# Patient Record
Sex: Female | Born: 1990 | Hispanic: Yes | Marital: Married | Smoking: Former smoker
Health system: Southern US, Community
[De-identification: ages and names within clinical notes are randomized; demographics above are authoritative.]

## PROBLEM LIST (undated history)

## (undated) ENCOUNTER — Inpatient Hospital Stay (HOSPITAL_COMMUNITY): Payer: Medicaid Other

## (undated) VITALS — BP 98/64 | HR 102 | Temp 97.7°F | Resp 20 | Ht 59.0 in | Wt 150.0 lb

## (undated) DIAGNOSIS — T1491XA Suicide attempt, initial encounter: Secondary | ICD-10-CM

## (undated) DIAGNOSIS — F419 Anxiety disorder, unspecified: Secondary | ICD-10-CM

## (undated) DIAGNOSIS — J45909 Unspecified asthma, uncomplicated: Secondary | ICD-10-CM

## (undated) DIAGNOSIS — R51 Headache: Secondary | ICD-10-CM

## (undated) DIAGNOSIS — F32A Depression, unspecified: Secondary | ICD-10-CM

## (undated) DIAGNOSIS — R519 Headache, unspecified: Secondary | ICD-10-CM

## (undated) DIAGNOSIS — F319 Bipolar disorder, unspecified: Secondary | ICD-10-CM

## (undated) DIAGNOSIS — F603 Borderline personality disorder: Secondary | ICD-10-CM

## (undated) DIAGNOSIS — E782 Mixed hyperlipidemia: Secondary | ICD-10-CM

## (undated) DIAGNOSIS — Z0282 Encounter for adoption services: Secondary | ICD-10-CM

## (undated) DIAGNOSIS — F329 Major depressive disorder, single episode, unspecified: Secondary | ICD-10-CM

## (undated) DIAGNOSIS — F4310 Post-traumatic stress disorder, unspecified: Secondary | ICD-10-CM

## (undated) DIAGNOSIS — K219 Gastro-esophageal reflux disease without esophagitis: Secondary | ICD-10-CM

## (undated) HISTORY — DX: Mixed hyperlipidemia: E78.2

## (undated) HISTORY — PX: GANGLION CYST EXCISION: SHX1691

## (undated) SURGERY — Surgical Case
Anesthesia: *Unknown

---

## 2012-05-16 ENCOUNTER — Other Ambulatory Visit: Payer: Self-pay | Admitting: Gastroenterology

## 2012-05-16 DIAGNOSIS — R1013 Epigastric pain: Secondary | ICD-10-CM

## 2012-05-23 ENCOUNTER — Ambulatory Visit
Admission: RE | Admit: 2012-05-23 | Discharge: 2012-05-23 | Disposition: A | Discharge status: Home / Self Care | Payer: 59 | Source: Ambulatory Visit | Attending: Gastroenterology | Admitting: Gastroenterology

## 2012-05-23 DIAGNOSIS — R1013 Epigastric pain: Secondary | ICD-10-CM

## 2012-06-09 ENCOUNTER — Emergency Department (HOSPITAL_COMMUNITY)
Admission: EM | Admit: 2012-06-09 | Discharge: 2012-06-10 | Disposition: A | Discharge status: Other Acute Inpatient Hospital | Payer: 59 | Attending: Emergency Medicine | Admitting: Emergency Medicine

## 2012-06-09 ENCOUNTER — Encounter (HOSPITAL_COMMUNITY): Payer: Self-pay | Admitting: *Deleted

## 2012-06-09 DIAGNOSIS — S61519A Laceration without foreign body of unspecified wrist, initial encounter: Secondary | ICD-10-CM

## 2012-06-09 DIAGNOSIS — S61509A Unspecified open wound of unspecified wrist, initial encounter: Secondary | ICD-10-CM | POA: Insufficient documentation

## 2012-06-09 DIAGNOSIS — F489 Nonpsychotic mental disorder, unspecified: Secondary | ICD-10-CM | POA: Insufficient documentation

## 2012-06-09 DIAGNOSIS — Z7289 Other problems related to lifestyle: Secondary | ICD-10-CM

## 2012-06-09 DIAGNOSIS — X789XXA Intentional self-harm by unspecified sharp object, initial encounter: Secondary | ICD-10-CM | POA: Insufficient documentation

## 2012-06-09 HISTORY — DX: Major depressive disorder, single episode, unspecified: F32.9

## 2012-06-09 HISTORY — DX: Anxiety disorder, unspecified: F41.9

## 2012-06-09 HISTORY — DX: Borderline personality disorder: F60.3

## 2012-06-09 HISTORY — DX: Depression, unspecified: F32.A

## 2012-06-09 HISTORY — DX: Bipolar disorder, unspecified: F31.9

## 2012-06-09 HISTORY — DX: Post-traumatic stress disorder, unspecified: F43.10

## 2012-06-09 LAB — COMPREHENSIVE METABOLIC PANEL
ALT: 6 U/L (ref 0–35)
AST: 16 U/L (ref 0–37)
Albumin: 4.4 g/dL (ref 3.5–5.2)
Alkaline Phosphatase: 60 U/L (ref 39–117)
BUN: 8 mg/dL (ref 6–23)
CO2: 19 mEq/L (ref 19–32)
Calcium: 10 mg/dL (ref 8.4–10.5)
Chloride: 106 mEq/L (ref 96–112)
Creatinine, Ser: 0.64 mg/dL (ref 0.50–1.10)
GFR calc Af Amer: >90 mL/min (ref >90)
GFR calc non Af Amer: >90 mL/min (ref >90)
Glucose, Bld: 118 mg/dL — ABNORMAL HIGH (ref 70–99)
Potassium: 3.2 mEq/L — ABNORMAL LOW (ref 3.5–5.1)
Sodium: 136 mEq/L (ref 135–145)
Total Bilirubin: 0.5 mg/dL (ref 0.3–1.2)
Total Protein: 7.5 g/dL (ref 6.0–8.3)

## 2012-06-09 LAB — URINE MICROSCOPIC-ADD ON

## 2012-06-09 LAB — CBC WITH DIFFERENTIAL/PLATELET
Basophils Absolute: 0 10*3/uL (ref 0.0–0.1)
Basophils Relative: 0 % (ref 0–1)
Eosinophils Absolute: 0.1 10*3/uL (ref 0.0–0.7)
Eosinophils Relative: 1 % (ref 0–5)
HCT: 39.4 % (ref 36.0–46.0)
Hemoglobin: 13.9 g/dL (ref 12.0–15.0)
Lymphocytes Relative: 38 % (ref 12–46)
Lymphs Abs: 2.1 10*3/uL (ref 0.7–4.0)
MCH: 29.8 pg (ref 26.0–34.0)
MCHC: 35.3 g/dL (ref 30.0–36.0)
MCV: 84.4 fL (ref 78.0–100.0)
Monocytes Absolute: 0.3 10*3/uL (ref 0.1–1.0)
Monocytes Relative: 5 % (ref 3–12)
Neutro Abs: 3 10*3/uL (ref 1.7–7.7)
Neutrophils Relative %: 55 % (ref 43–77)
Platelets: 216 10*3/uL (ref 150–400)
RBC: 4.67 MIL/uL (ref 3.87–5.11)
RDW: 12.7 % (ref 11.5–15.5)
WBC: 5.4 10*3/uL (ref 4.0–10.5)

## 2012-06-09 LAB — ETHANOL: Alcohol, Ethyl (B): <11 mg/dL (ref 0–11)

## 2012-06-09 LAB — URINALYSIS, ROUTINE W REFLEX MICROSCOPIC
Glucose, UA: NEGATIVE mg/dL
Hgb urine dipstick: NEGATIVE
Ketones, ur: 40 mg/dL — AB
Nitrite: NEGATIVE
Protein, ur: NEGATIVE mg/dL
Specific Gravity, Urine: 1.024 (ref 1.005–1.030)
Urobilinogen, UA: 1 mg/dL (ref 0.0–1.0)
pH: 6.5 (ref 5.0–8.0)

## 2012-06-09 LAB — POCT PREGNANCY, URINE: Preg Test, Ur: NEGATIVE

## 2012-06-09 MED ORDER — ETODOLAC 400 MG PO TABS
400.0000 mg | ORAL_TABLET | Freq: Three times a day (TID) | ORAL | Status: DC
  Administered 2012-06-10: 400 mg via ORAL
  Filled 2012-06-09 (×4): qty 1

## 2012-06-09 MED ORDER — TRAZODONE HCL 50 MG PO TABS
75.0000 mg | ORAL_TABLET | Freq: Every day | ORAL | Status: DC
  Administered 2012-06-10: 75 mg via ORAL
  Filled 2012-06-09: qty 2

## 2012-06-09 MED ORDER — NALTREXONE HCL 50 MG PO TABS
25.0000 mg | ORAL_TABLET | Freq: Every morning | ORAL | Status: DC
  Administered 2012-06-10: 25 mg via ORAL
  Filled 2012-06-09: qty 1

## 2012-06-09 MED ORDER — IBUPROFEN 600 MG PO TABS: 600.0000 mg | ORAL_TABLET | Freq: Three times a day (TID) | ORAL | Status: DC | PRN

## 2012-06-09 MED ORDER — ZIPRASIDONE HCL 20 MG PO CAPS
40.0000 mg | ORAL_CAPSULE | Freq: Every day | ORAL | Status: DC
  Administered 2012-06-10: 40 mg via ORAL
  Filled 2012-06-09: qty 2

## 2012-06-09 MED ORDER — ACETAMINOPHEN 325 MG PO TABS: 650.0000 mg | ORAL_TABLET | ORAL | Status: DC | PRN

## 2012-06-09 MED ORDER — ONDANSETRON HCL 4 MG PO TABS: 4.0000 mg | ORAL_TABLET | Freq: Three times a day (TID) | ORAL | Status: DC | PRN

## 2012-06-09 MED ORDER — ZOLPIDEM TARTRATE 5 MG PO TABS: 5.0000 mg | ORAL_TABLET | Freq: Every evening | ORAL | Status: DC | PRN

## 2012-06-09 MED ORDER — ALUM & MAG HYDROXIDE-SIMETH 200-200-20 MG/5ML PO SUSP: 30.0000 mL | ORAL | Status: DC | PRN

## 2012-06-09 MED ORDER — TOPIRAMATE 25 MG PO TABS: 50.0000 mg | ORAL_TABLET | Freq: Every day | ORAL | Status: DC

## 2012-06-09 MED ORDER — POTASSIUM CHLORIDE CRYS ER 20 MEQ PO TBCR
40.0000 meq | EXTENDED_RELEASE_TABLET | Freq: Once | ORAL | Status: AC
  Administered 2012-06-10: 40 meq via ORAL
  Filled 2012-06-09: qty 2

## 2012-06-09 MED ORDER — ACETAMINOPHEN 325 MG PO TABS
650.0000 mg | ORAL_TABLET | Freq: Once | ORAL | Status: AC
  Administered 2012-06-09: 650 mg via ORAL
  Filled 2012-06-09: qty 2

## 2012-06-09 MED ORDER — LORAZEPAM 1 MG PO TABS: 1.0000 mg | ORAL_TABLET | Freq: Three times a day (TID) | ORAL | Status: DC | PRN

## 2012-06-09 MED ORDER — SERTRALINE HCL 50 MG PO TABS
100.0000 mg | ORAL_TABLET | Freq: Every day | ORAL | Status: DC
  Administered 2012-06-10: 100 mg via ORAL
  Filled 2012-06-09: qty 2

## 2012-06-09 NOTE — ED Notes (Signed)
Pt ambulated with steady gait to bathroom with sitter. Pt calm and cooperative, stating she understands why the sitter must be with her at all times
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
## 2012-06-09 NOTE — ED Provider Notes (Signed)
History    Kelly Barry brought in as possible suicide attempt. Cut L wrist. Pt says that not actually trying to kill self. Upset that felt parents ignoring her complaints of abdominal pain which has been ongoing for past 5 months and currently being worked up. Became severe tonight and says upset because felt like parents weren't taking her seriously and would bring her to ED. Pt says she cut her wrist to deal with the pain. Psych hx including previous cutting behavior. Pt says abdominal pain not better. No urinary complaints. No fever or chills. Denies ingestion.   CSN: [id number]  Arrival date & time 06/09/12  1945   First MD Initiated Contact with Patient 06/09/12 2239      Chief Complaint  Patient presents with  . Suicide Attempt  . Laceration    wrist    (Consider location/radiation/quality/duration/timing/severity/associated sxs/prior treatment) HPI  Past Medical History  Diagnosis Date  . PTSD (post-traumatic stress disorder)   . Bipolar disorder   . Depression   . Anxiety   . Borderline personality disorder     No past surgical history on file.  No family history on file.  History  Substance Use Topics  . Smoking status: Not on file  . Smokeless tobacco: Not on file  . Alcohol Use:     OB History    Grav Para Term Preterm Abortions TAB SAB Ect Mult Living                  Review of Systems   Review of symptoms negative unless otherwise noted in HPI.   Allergies  Hydroxyzine  Home Medications   Current Outpatient Rx  Name Route Sig Dispense Refill  . ETODOLAC 400 MG PO TABS Oral Take 400 mg by mouth 3 (three) times daily.    Marland Kitchen NALTREXONE HCL 50 MG PO TABS Oral Take 25 mg by mouth every morning.    Marland Kitchen SERTRALINE HCL 100 MG PO TABS Oral Take 100 mg by mouth daily.    . TOPIRAMATE 50 MG PO TABS Oral Take 50 mg by mouth at bedtime.    . TRAZODONE HCL 50 MG PO TABS Oral Take 75 mg by mouth at bedtime.    Marland Kitchen ZIPRASIDONE HCL 40 MG PO CAPS Oral Take 40 mg by mouth  at bedtime.      BP 116/62  Pulse 105  Temp 99.7 F (37.6 C) (Oral)  Resp 15  SpO2 98%  Physical Exam  Nursing note and vitals reviewed. Constitutional: She appears well-developed and well-nourished. No distress.  HENT:  Head: Normocephalic and atraumatic.  Eyes: Conjunctivae are normal. Right eye exhibits no discharge. Left eye exhibits no discharge.  Neck: Neck supple.  Cardiovascular: Normal rate, regular rhythm and normal heart sounds.  Exam reveals no gallop and no friction rub.   No murmur heard. Pulmonary/Chest: Effort normal and breath sounds normal. No respiratory distress.  Abdominal: Soft. She exhibits no distension. There is no tenderness.  Musculoskeletal: She exhibits no edema and no tenderness.  Neurological: She is alert.  Skin: Skin is warm and dry.       Volar aspect of distal L wrist with 2.5 cm laceration. No active bleeding. Visualized through out ROM with evidence of tendon injury or to other deeper structures. Flexion intact against resistance. Neurovascularly intact distally. Good radial pulse. Numerous well healed linear scars along forearm.  Psychiatric: She has a normal mood and affect. Her behavior is normal. Thought content normal.    ED Course  Procedures (including critical care time)  LACERATION REPAIR Performed by: Raeford Razor Authorized by: Raeford Razor Consent: Verbal consent obtained. Risks and benefits: risks, benefits and alternatives were discussed Consent given by: patient Patient identity confirmed: provided demographic data Prepped and Draped in normal sterile fashion Wound explored  Laceration Location: L wrist  Laceration Length: 2.5 cm  No Foreign Bodies seen or palpated  Anesthesia: local infiltration  Local anesthetic: lidocaine 2% w epinephrine  Anesthetic total: 2 ml  Irrigation method: syringe Amount of cleaning: standard  Skin closure: single layer  Number of sutures: 3  Technique: staples  Patient  tolerance: Patient tolerated the procedure well with no immediate complications.  Labs Reviewed  COMPREHENSIVE METABOLIC PANEL - Abnormal; Notable for the following:    Potassium 3.2 (*)     Glucose, Bld 118 (*)     All other components within normal limits  URINALYSIS, ROUTINE W REFLEX MICROSCOPIC - Abnormal; Notable for the following:    APPearance CLOUDY (*)     Bilirubin Urine SMALL (*)     Ketones, ur 40 (*)     Leukocytes, UA SMALL (*)     All other components within normal limits  URINE MICROSCOPIC-ADD ON - Abnormal; Notable for the following:    Squamous Epithelial / LPF FEW (*)     Bacteria, UA MANY (*)     All other components within normal limits  CBC WITH DIFFERENTIAL  ETHANOL  POCT PREGNANCY, URINE   No results found.   1. Deliberate self-cutting   2. Wrist laceration       MDM  Kelly Barry brought in for evaluation of possible suicide attempt, which pt adamantly denies. Hx of cutting and numerous well healed linear scars on L forearm. Pt says tonight was cutting to distract herself from her abdominal pain and not actually trying to kill self. Laceration deep enough to require repair but they have had in the past as well. Pt has been medically cleared at this time. Will obtain psych consult for recommendations in terms of disposition.       Raeford Razor, MD 06/10/12 Jacinta Shoe
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
## 2012-06-09 NOTE — ED Notes (Signed)
[id number]<AV> Expected date:<BR> Expected time:<BR> Means of arrival:<BR> Comments:<BR> EMS/SI/Slit wrists

## 2012-06-10 ENCOUNTER — Encounter (HOSPITAL_COMMUNITY): Payer: Self-pay | Admitting: *Deleted

## 2012-06-10 NOTE — ED Notes (Signed)
Report given to psych ED. 

## 2012-06-10 NOTE — ED Notes (Signed)
Report given to Psych PA, who will eval patient in lieu of telepsych.

## 2012-06-10 NOTE — ED Provider Notes (Signed)
Pt accepted at [facility] by Dr. Carles Collet, MD 06/10/12 [phone]
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
## 2012-06-11 ENCOUNTER — Ambulatory Visit (HOSPITAL_COMMUNITY): Admission: RE | Admit: 2012-06-11 | Payer: 59 | Source: Ambulatory Visit | Admitting: Gastroenterology

## 2012-06-11 ENCOUNTER — Encounter (HOSPITAL_COMMUNITY): Admission: RE | Payer: Self-pay | Source: Ambulatory Visit

## 2012-06-11 SURGERY — ESOPHAGOGASTRODUODENOSCOPY (EGD) WITH PROPOFOL
Anesthesia: Monitor Anesthesia Care

## 2012-06-17 NOTE — H&P (Signed)
Agree with the assessment

## 2012-06-25 ENCOUNTER — Encounter (HOSPITAL_COMMUNITY): Payer: Self-pay | Admitting: Anesthesiology

## 2012-06-25 ENCOUNTER — Ambulatory Visit (HOSPITAL_COMMUNITY)
Admission: RE | Admit: 2012-06-25 | Discharge: 2012-06-25 | Disposition: A | Discharge status: Home / Self Care | Payer: 59 | Source: Ambulatory Visit | Attending: Gastroenterology | Admitting: Gastroenterology

## 2012-06-25 ENCOUNTER — Encounter (HOSPITAL_COMMUNITY): Payer: Self-pay | Admitting: *Deleted

## 2012-06-25 ENCOUNTER — Ambulatory Visit (HOSPITAL_COMMUNITY): Payer: 59 | Admitting: Anesthesiology

## 2012-06-25 ENCOUNTER — Encounter (HOSPITAL_COMMUNITY)
Admission: RE | Disposition: A | Discharge status: Home / Self Care | Payer: Self-pay | Source: Ambulatory Visit | Attending: Gastroenterology

## 2012-06-25 DIAGNOSIS — R1013 Epigastric pain: Secondary | ICD-10-CM | POA: Insufficient documentation

## 2012-06-25 DIAGNOSIS — K2940 Chronic atrophic gastritis without bleeding: Secondary | ICD-10-CM | POA: Insufficient documentation

## 2012-06-25 DIAGNOSIS — K3189 Other diseases of stomach and duodenum: Secondary | ICD-10-CM | POA: Insufficient documentation

## 2012-06-25 HISTORY — PX: ESOPHAGOGASTRODUODENOSCOPY: SHX5428

## 2012-06-25 SURGERY — EGD (ESOPHAGOGASTRODUODENOSCOPY)
Anesthesia: Monitor Anesthesia Care

## 2012-06-25 MED ORDER — PROPOFOL 10 MG/ML IV EMUL
INTRAVENOUS | Status: DC | PRN
  Administered 2012-06-25: 20 mg via INTRAVENOUS
  Administered 2012-06-25: 50 mg via INTRAVENOUS
  Administered 2012-06-25: 20 mg via INTRAVENOUS

## 2012-06-25 MED ORDER — SODIUM CHLORIDE 0.9 % IV SOLN: INTRAVENOUS | Status: DC

## 2012-06-25 MED ORDER — BUTAMBEN-TETRACAINE-BENZOCAINE 2-2-14 % EX AERO
INHALATION_SPRAY | CUTANEOUS | Status: DC | PRN
  Administered 2012-06-25: 2 via TOPICAL

## 2012-06-25 MED ORDER — LACTATED RINGERS IV SOLN
INTRAVENOUS | Status: DC | PRN
  Administered 2012-06-25: 10:00:00 via INTRAVENOUS

## 2012-06-25 MED ORDER — MIDAZOLAM HCL 5 MG/5ML IJ SOLN
INTRAMUSCULAR | Status: DC | PRN
  Administered 2012-06-25: 2 mg via INTRAVENOUS

## 2012-06-25 MED ORDER — FENTANYL CITRATE 0.05 MG/ML IJ SOLN
INTRAMUSCULAR | Status: DC | PRN
  Administered 2012-06-25: 50 ug via INTRAVENOUS

## 2012-06-25 NOTE — H&P (Signed)
Patient interval history reviewed.  Patient examined again.  Was admitted to behavioral health couple weeks ago for suicidal ideation/attempt.  She was discharged from this facility and is now outpatient.  There has been no other change from documented H/P dated  (scanned into chart from our office) except as documented above.  Assessment:  1.  Epigastric discomfort.  Labs and U/S unrevealing.  No improvement with PPI (pantoprazole) or antispasmodic (dicylomine). 2.  ? History of H. Pylori.  Plan:  1.  Upper endoscopy with likely gastric and possible small bowel biopsies. 2.  Risks (bleeding, infection, bowel perforation that could require surgery, sedation-related changes in cardiopulmonary systems), benefits (identification and possible treatment of source of symptoms, exclusion of certain causes of symptoms), and alternatives (watchful waiting, radiographic imaging studies, empiric medical treatment) of upper endoscopy (EGD) were explained to patient/mother in detail and patient wishes to proceed.

## 2012-06-25 NOTE — Op Note (Signed)
[facility] [address] [place], [address]   ENDOSCOPY PROCEDURE REPORT  PATIENT: Kelly Barry, Kelly Barry  MR#: [id number] BIRTHDATE: 1991-09-19 , 21  yrs. old GENDER: Female ENDOSCOPIST: Willis Modena, MD REFERRED BY:  Self-Referred PROCEDURE DATE:  06/25/2012 PROCEDURE:  Upper endoscopy (EGD) with biopsies. ASA CLASS:     ASA II INDICATIONS:  epigastric abdominal pain, dyspepsia, ? history H. pylori MEDICATIONS: MAC per anesthesia TOPICAL ANESTHETIC: Cetacaine spray x 2  DESCRIPTION OF PROCEDURE: After the risks benefits and alternatives of the procedure were thoroughly explained, informed consent was obtained.  The [id number] ([id number]) endoscope was introduced through the mouth and advanced to the second portion of the duodenum     . Without limitations.  The instrument was slowly withdrawn as the mucosa was fully examined.     Findings: Normal esophagus.  Mild pre-pyloric gastric erosions and gastritis, biopsied with cold forceps.  Otherwise normal stomach and pylorus. Normal duodenum to the second portion.              The scope was then withdrawn from the patient and the procedure completed.  ENDOSCOPIC IMPRESSION:     As above.  RECOMMENDATIONS:     1.  Watch for potential complications of procedure. 2.  Await biopsy results. 3.  Change pantoprazole to different PPI, pending biopsy results. 4.  If symptoms persist, consider CT abd/pelvis. 5.  Follow-up with me in 4-6 weeks.  eSigned:  Willis Modena, MD 06/25/2012 11:37 AM   CC:

## 2012-06-25 NOTE — Preoperative (Signed)
Beta Blockers   Reason not to administer Beta Blockers:Not Applicable 

## 2012-06-25 NOTE — Anesthesia Postprocedure Evaluation (Signed)
  Anesthesia Post-op Note  Patient: Kelly Barry  Procedure(s) Performed: Procedure(s) (LRB): ESOPHAGOGASTRODUODENOSCOPY (EGD) (N/A)  Patient Location: PACU  Anesthesia Type: MAC  Level of Consciousness: awake and alert   Airway and Oxygen Therapy: Patient Spontanous Breathing  Post-op Pain: mild  Post-op Assessment: Post-op Vital signs reviewed, Patient's Cardiovascular Status Stable, Respiratory Function Stable, Patent Airway and No signs of Nausea or vomiting  Post-op Vital Signs: stable  Complications: No apparent anesthesia complications

## 2012-06-25 NOTE — Anesthesia Preprocedure Evaluation (Addendum)
Anesthesia Evaluation  Patient identified by MRN, date of birth, ID band Patient awake    Reviewed: Allergy & Precautions, H&P , NPO status , Patient's Chart, lab work & pertinent test results  Airway Mallampati: II TM Distance: >3 FB Neck ROM: Full    Dental No notable dental hx.    Pulmonary neg pulmonary ROS,  breath sounds clear to auscultation  Pulmonary exam normal       Cardiovascular negative cardio ROS  Rhythm:Regular Rate:Normal     Neuro/Psych PSYCHIATRIC DISORDERS Anxiety Depression negative neurological ROS     GI/Hepatic negative GI ROS, Neg liver ROS,   Endo/Other  negative endocrine ROS  Renal/GU negative Renal ROS  negative genitourinary   Musculoskeletal negative musculoskeletal ROS (+)   Abdominal   Peds negative pediatric ROS (+)  Hematology negative hematology ROS (+)   Anesthesia Other Findings   Reproductive/Obstetrics Negative pregnancy test 06/09/12. Denies the possibility of pregnancy today. Declines testing.                          Anesthesia Physical Anesthesia Plan  ASA: II  Anesthesia Plan: MAC   Post-op Pain Management:    Induction: Intravenous  Airway Management Planned:   Additional Equipment:   Intra-op Plan:   Post-operative Plan: Extubation in OR  Informed Consent: I have reviewed the patients History and Physical, chart, labs and discussed the procedure including the risks, benefits and alternatives for the proposed anesthesia with the patient or authorized representative who has indicated his/her understanding and acceptance.   Dental advisory given  Plan Discussed with: CRNA  Anesthesia Plan Comments:         Anesthesia Quick Evaluation

## 2012-06-25 NOTE — Transfer of Care (Signed)
Immediate Anesthesia Transfer of Care Note  Patient: Kelly Barry  Procedure(s) Performed: Procedure(s) (LRB) with comments: ESOPHAGOGASTRODUODENOSCOPY (EGD) (N/A) - [name]/ebp  Patient Location: PACU  Anesthesia Type: MAC  Level of Consciousness: awake, sedated and patient cooperative  Airway & Oxygen Therapy: Patient Spontanous Breathing and Patient connected to nasal cannula oxygen  Post-op Assessment: Report given to PACU RN and Post -op Vital signs reviewed and stable  Post vital signs: Reviewed and stable  Complications: No apparent anesthesia complications

## 2012-06-27 ENCOUNTER — Encounter (HOSPITAL_COMMUNITY): Payer: Self-pay | Admitting: Gastroenterology

## 2012-08-14 ENCOUNTER — Emergency Department (HOSPITAL_COMMUNITY)
Admission: EM | Admit: 2012-08-14 | Discharge: 2012-08-15 | Disposition: A | Discharge status: Home / Self Care | Payer: 59 | Attending: Emergency Medicine | Admitting: Emergency Medicine

## 2012-08-14 DIAGNOSIS — E876 Hypokalemia: Secondary | ICD-10-CM | POA: Insufficient documentation

## 2012-08-14 DIAGNOSIS — F341 Dysthymic disorder: Secondary | ICD-10-CM | POA: Insufficient documentation

## 2012-08-14 DIAGNOSIS — K2970 Gastritis, unspecified, without bleeding: Secondary | ICD-10-CM

## 2012-08-14 DIAGNOSIS — K2900 Acute gastritis without bleeding: Secondary | ICD-10-CM | POA: Insufficient documentation

## 2012-08-14 DIAGNOSIS — Z79899 Other long term (current) drug therapy: Secondary | ICD-10-CM | POA: Insufficient documentation

## 2012-08-14 DIAGNOSIS — F4310 Post-traumatic stress disorder, unspecified: Secondary | ICD-10-CM | POA: Insufficient documentation

## 2012-08-14 MED ORDER — ONDANSETRON 8 MG PO TBDP
8.0000 mg | ORAL_TABLET | Freq: Once | ORAL | Status: AC
  Administered 2012-08-14: 8 mg via ORAL
  Filled 2012-08-14: qty 1

## 2012-08-14 NOTE — ED Notes (Signed)
Pt reports having nausea for 9 months. States that she has not eaten since last Thursday. Admits to having an eating disorder and seeing the GI doctor for GI problems. States she last saw the GI MD two weeks ago and was diagnosed with acid reflux.

## 2012-08-14 NOTE — ED Notes (Signed)
Patient is alert and oriented x3.  She is complaining of nausea for 3 months.  Only dry heaves with ems.  Currently she rates her pain at a 9 of 10

## 2012-08-15 ENCOUNTER — Emergency Department (HOSPITAL_COMMUNITY): Payer: 59

## 2012-08-15 LAB — URINE MICROSCOPIC-ADD ON

## 2012-08-15 LAB — URINALYSIS, ROUTINE W REFLEX MICROSCOPIC
Glucose, UA: NEGATIVE mg/dL
Hgb urine dipstick: NEGATIVE
Protein, ur: NEGATIVE mg/dL
Specific Gravity, Urine: 1.019 (ref 1.005–1.030)
pH: 7.5 (ref 5.0–8.0)

## 2012-08-15 LAB — POCT I-STAT, CHEM 8
BUN: 4 mg/dL — ABNORMAL LOW (ref 6–23)
Calcium, Ion: 1.29 mmol/L — ABNORMAL HIGH (ref 1.12–1.23)
Chloride: 109 mEq/L (ref 96–112)
Creatinine, Ser: 0.8 mg/dL (ref 0.50–1.10)
Glucose, Bld: 94 mg/dL (ref 70–99)
HCT: 41 % (ref 36.0–46.0)

## 2012-08-15 MED ORDER — ONDANSETRON HCL 8 MG PO TABS
8.0000 mg | ORAL_TABLET | Freq: Three times a day (TID) | ORAL | Status: DC | PRN
Start: 2012-08-15 — End: 2013-03-01

## 2012-08-15 MED ORDER — ONDANSETRON HCL 4 MG/2ML IJ SOLN
4.0000 mg | Freq: Once | INTRAMUSCULAR | Status: AC
  Administered 2012-08-15: 4 mg via INTRAVENOUS
  Filled 2012-08-15: qty 2

## 2012-08-15 MED ORDER — MAGNESIUM SULFATE 40 MG/ML IJ SOLN
2.0000 g | Freq: Once | INTRAMUSCULAR | Status: AC
  Administered 2012-08-15: 2 g via INTRAVENOUS
  Filled 2012-08-15: qty 50

## 2012-08-15 MED ORDER — IOHEXOL 300 MG/ML  SOLN
80.0000 mL | Freq: Once | INTRAMUSCULAR | Status: AC | PRN
  Administered 2012-08-15: 80 mL via INTRAVENOUS

## 2012-08-15 MED ORDER — MORPHINE SULFATE 4 MG/ML IJ SOLN
4.0000 mg | Freq: Once | INTRAMUSCULAR | Status: AC
  Administered 2012-08-15: 4 mg via INTRAVENOUS
  Filled 2012-08-15: qty 1

## 2012-08-15 MED ORDER — POTASSIUM CHLORIDE CRYS ER 20 MEQ PO TBCR
40.0000 meq | EXTENDED_RELEASE_TABLET | Freq: Once | ORAL | Status: AC
  Administered 2012-08-15: 40 meq via ORAL
  Filled 2012-08-15: qty 2

## 2012-08-15 MED ORDER — GI COCKTAIL ~~LOC~~
30.0000 mL | Freq: Once | ORAL | Status: AC
  Administered 2012-08-15: 30 mL via ORAL
  Filled 2012-08-15: qty 30

## 2012-08-15 MED ORDER — HYDROCODONE-ACETAMINOPHEN 5-500 MG PO TABS: 1.0000 | ORAL_TABLET | Freq: Four times a day (QID) | ORAL | Status: DC | PRN

## 2012-08-15 MED ORDER — FAMOTIDINE IN NACL 20-0.9 MG/50ML-% IV SOLN
20.0000 mg | Freq: Once | INTRAVENOUS | Status: AC
  Administered 2012-08-15: 20 mg via INTRAVENOUS
  Filled 2012-08-15: qty 50

## 2012-08-15 MED ORDER — POTASSIUM CHLORIDE 10 MEQ/100ML IV SOLN
10.0000 meq | Freq: Once | INTRAVENOUS | Status: AC
  Administered 2012-08-15: 10 meq via INTRAVENOUS
  Filled 2012-08-15: qty 100

## 2012-08-15 MED ORDER — PANTOPRAZOLE SODIUM 40 MG IV SOLR
40.0000 mg | Freq: Once | INTRAVENOUS | Status: AC
  Administered 2012-08-15: 40 mg via INTRAVENOUS
  Filled 2012-08-15: qty 40

## 2012-08-15 NOTE — ED Notes (Signed)
Patient transported to CT 

## 2012-08-15 NOTE — ED Notes (Signed)
End of shift report given to Amy, Rn

## 2012-08-15 NOTE — ED Notes (Signed)
[id number]<AV> Expected date:<BR> Expected time:<BR> Means of arrival:<BR> Comments:<BR> Hold for 7 or 8

## 2012-08-15 NOTE — ED Notes (Signed)
Vital signs stable. 

## 2012-08-15 NOTE — ED Provider Notes (Signed)
Medical screening examination/treatment/procedure(s) were performed by non-physician practitioner and as supervising physician I was immediately available for consultation/collaboration.   Hanley Seamen, MD 08/15/12 [phone]

## 2012-08-15 NOTE — ED Notes (Signed)
Discharge instructions reviewed. Rx given x2. All questions answered. Note given stating pt received treatment overnight.

## 2012-08-15 NOTE — ED Provider Notes (Signed)
History     CSN: [id number]  Arrival date & time 08/14/12  2113   First MD Initiated Contact with Patient 08/14/12 2153      Chief Complaint  Patient presents with  . Nausea    (Consider location/radiation/quality/duration/timing/severity/associated sxs/prior treatment) HPI History provided by pt and prior chart.  Pt reports chronic, non-radiating, burning epigastric pain w/ association N/V and bloating for the past 9 months.  Has been evaluated by GI, most recently 2 weeks ago, several different medications have been trialed, and nothing seems to improve sx.  Pain and nausea acutely worsened this past week.  Unable to tolerate any food or fluids and has therefore, not had anything to eat in 7 days.  Denies fever, CP, diarrhea, hematochezia, urinary and vaginal sx.  Per prior chart, pt had a nml [place] of abd 05/23/12 and then endoscopy 9/4 that showed mild gastritis and biopsy neg for h.pylori and malignancy.  Dr. Dulce Sellar planned to change PPI, see patient back in f/u and consider CT abd/pelvis if no improvement in sx.  Pt has h/o anorexia but this is not an active problem.  She has also had a recent admission for cutting.  Denies SI or any thoughts of harming herself today.   Past Medical History  Diagnosis Date  . PTSD (post-traumatic stress disorder)   . Bipolar disorder   . Depression   . Anxiety   . Borderline personality disorder     Past Surgical History  Procedure Date  . Ganglion cyst excision   . Esophagogastroduodenoscopy 06/25/2012    Procedure: ESOPHAGOGASTRODUODENOSCOPY (EGD);  Surgeon: Willis Modena, MD;  Location: [facility];  Service: Endoscopy;  Laterality: N/A;  [name]/ebp    No family history on file.  History  Substance Use Topics  . Smoking status: Not on file  . Smokeless tobacco: Not on file  . Alcohol Use: No    OB History    Grav Para Term Preterm Abortions TAB SAB Ect Mult Living                  Review of Systems  All other systems reviewed  and are negative.    Allergies  Hydroxyzine  Home Medications   Current Outpatient Rx  Name Route Sig Dispense Refill  . DEXLANSOPRAZOLE 60 MG PO CPDR Oral Take 60 mg by mouth daily.    . ETODOLAC 400 MG PO TABS Oral Take 400 mg by mouth 3 (three) times daily.    Marland Kitchen NALTREXONE HCL 50 MG PO TABS Oral Take 25 mg by mouth every morning. To help with opiates withdrawn    . OMEPRAZOLE 20 MG PO CPDR Oral Take 20 mg by mouth daily.    . SERTRALINE HCL 100 MG PO TABS Oral Take 100 mg by mouth daily.    . TOPIRAMATE 50 MG PO TABS Oral Take 50 mg by mouth at bedtime.    . TRAZODONE HCL 50 MG PO TABS Oral Take 75 mg by mouth at bedtime.    Marland Kitchen ZIPRASIDONE HCL 20 MG PO CAPS Oral Take 20 mg by mouth 2 (two) times daily with a meal.    . ZIPRASIDONE HCL 20 MG PO CAPS Oral Take 20 mg by mouth at bedtime.      BP 114/64  Pulse 76  SpO2 98%  Physical Exam  Nursing note and vitals reviewed. Constitutional: She is oriented to person, place, and time. She appears well-developed and well-nourished. No distress.  HENT:  Head: Normocephalic and atraumatic.  Eyes:       Normal appearance  Neck: Normal range of motion.  Cardiovascular: Normal rate and regular rhythm.   Pulmonary/Chest: Effort normal and breath sounds normal. No respiratory distress.  Abdominal: Soft. Bowel sounds are normal. She exhibits no distension and no mass. There is no tenderness. There is no rebound and no guarding.       Mild-mod epigastric and diffuse lower abdominal tenderness.    Genitourinary:       No CVA tenderness  Musculoskeletal: Normal range of motion.  Neurological: She is alert and oriented to person, place, and time.  Skin: Skin is warm and dry. No rash noted.  Psychiatric: She has a normal mood and affect. Her behavior is normal.    ED Course  Procedures (including critical care time)  Labs Reviewed  POCT I-STAT, CHEM 8 - Abnormal; Notable for the following:    Potassium 2.8 (*)     BUN 4 (*)      Calcium, Ion 1.29 (*)     All other components within normal limits  URINALYSIS, ROUTINE W REFLEX MICROSCOPIC - Abnormal; Notable for the following:    APPearance TURBID (*)     Leukocytes, UA SMALL (*)     All other components within normal limits  URINE MICROSCOPIC-ADD ON - Abnormal; Notable for the following:    Squamous Epithelial / LPF FEW (*)     Bacteria, UA FEW (*)     All other components within normal limits  PREGNANCY, URINE   Ct Abdomen Pelvis W Contrast  08/15/2012  *RADIOLOGY REPORT*  Clinical Data: Abdominal pain  CT ABDOMEN AND PELVIS WITH CONTRAST  Technique:  Multidetector CT imaging of the abdomen and pelvis was performed following the standard protocol during bolus administration of intravenous contrast.  Contrast: 80mL OMNIPAQUE IOHEXOL 300 MG/ML  SOLN  Comparison: 05/23/2012 abdominal ultrasound  Findings: Limited images through the lung bases demonstrate no significant appreciable abnormality. The heart size is within normal limits. No pleural or pericardial effusion.  Unremarkable liver, biliary system, spleen, pancreas, adrenal glands, kidneys.  No hydronephrosis or hydroureter.  No bowel obstruction.  No overt evidence for colitis.  Normal appendix.  No free intraperitoneal air or fluid.  No lymphadenopathy.  Normal caliber vasculature.  Thin-walled bladder.  Unremarkable CT appearance to the uterus and adnexa.  No acute osseous finding.  IMPRESSION: No acute abnormality identified by CT.   Original Report Authenticated By: Waneta Martins, M.D.      1. Gastritis   2. Hypokalemia       MDM  21yo F w/ h/o anorexia as well as chronic abd pain, currently thought to be secondary to gastritis, presents w/ acutely worsened epigastric pain, N/V and inability to tolerate pos x 1 week.  Per Dr. Hulen Shouts note, plan following endoscopy was to change PPI, see in f/u and if no sx improvement, CT the abd/pelvis.  Pt has received IV NS, protonix, pepcid, morphine and zofran as  well as GI cocktail.  Will reassess shortly.  U/A pending but labs otherwise sig for hypokalemia.  IV postassium and magnesium and po potassium ordered for replacement.  Will CT abdomen today.  1:45 AM   Pt reports that pain and nausea have improved.  2:24 AM   CT neg.  Results discussed w/ pt.  She is tolerating pos and reports that her pain is improved.  Prescribed hydrocodone and zofran.  Recommended that she continue Dr.Outlaw's treatment plan and follow up with him as scheduled  next week.  Return precautions discussed. 5:52 AM        Otilio Miu, PA 08/15/12 [phone]

## 2012-08-15 NOTE — ED Notes (Signed)
Patient has completed PO contrast. CT notified.

## 2012-08-15 NOTE — ED Notes (Signed)
Patient has completed 1 cup of contrast. Reminded patient she must drink both cups to do testing.

## 2012-08-15 NOTE — ED Notes (Signed)
Pt states "she has been having abd pain for the past 9 mos. States she has not eaten since last Thursday (more than a week), not because of anorexia but because her stomach hurts and burns so bad. The thought of food makes me throw up and when I eat I just throw the food right back up". Also, States "she is seeing Dr. Dulce Sellar and has had upper endo and further testing which have all been negative, or has showed some irritation". Pt is thin, but appropriate. Provides good eye contact.

## 2012-08-26 ENCOUNTER — Other Ambulatory Visit: Payer: Self-pay | Admitting: Gastroenterology

## 2012-08-26 DIAGNOSIS — R109 Unspecified abdominal pain: Secondary | ICD-10-CM

## 2012-08-28 ENCOUNTER — Ambulatory Visit
Admission: RE | Admit: 2012-08-28 | Discharge: 2012-08-28 | Disposition: A | Discharge status: Home / Self Care | Payer: 59 | Source: Ambulatory Visit | Attending: Gastroenterology | Admitting: Gastroenterology

## 2012-08-28 ENCOUNTER — Other Ambulatory Visit (HOSPITAL_COMMUNITY): Payer: Self-pay | Admitting: Gastroenterology

## 2012-08-28 DIAGNOSIS — R109 Unspecified abdominal pain: Secondary | ICD-10-CM

## 2012-09-24 ENCOUNTER — Encounter (HOSPITAL_COMMUNITY): Payer: 59

## 2013-03-01 ENCOUNTER — Emergency Department (HOSPITAL_COMMUNITY)
Admission: EM | Admit: 2013-03-01 | Discharge: 2013-03-02 | Disposition: A | Discharge status: Home / Self Care | Payer: Medicaid Other | Attending: Emergency Medicine | Admitting: Emergency Medicine

## 2013-03-01 ENCOUNTER — Encounter (HOSPITAL_COMMUNITY): Payer: Self-pay | Admitting: *Deleted

## 2013-03-01 DIAGNOSIS — K219 Gastro-esophageal reflux disease without esophagitis: Secondary | ICD-10-CM | POA: Insufficient documentation

## 2013-03-01 DIAGNOSIS — R1013 Epigastric pain: Secondary | ICD-10-CM | POA: Insufficient documentation

## 2013-03-01 DIAGNOSIS — Z79899 Other long term (current) drug therapy: Secondary | ICD-10-CM | POA: Insufficient documentation

## 2013-03-01 DIAGNOSIS — R112 Nausea with vomiting, unspecified: Secondary | ICD-10-CM | POA: Insufficient documentation

## 2013-03-01 DIAGNOSIS — F603 Borderline personality disorder: Secondary | ICD-10-CM | POA: Insufficient documentation

## 2013-03-01 DIAGNOSIS — F319 Bipolar disorder, unspecified: Secondary | ICD-10-CM | POA: Insufficient documentation

## 2013-03-01 DIAGNOSIS — Z9889 Other specified postprocedural states: Secondary | ICD-10-CM | POA: Insufficient documentation

## 2013-03-01 DIAGNOSIS — F411 Generalized anxiety disorder: Secondary | ICD-10-CM | POA: Insufficient documentation

## 2013-03-01 DIAGNOSIS — R630 Anorexia: Secondary | ICD-10-CM | POA: Insufficient documentation

## 2013-03-01 LAB — COMPREHENSIVE METABOLIC PANEL
AST: 18 U/L (ref 0–37)
Albumin: 4.2 g/dL (ref 3.5–5.2)
Calcium: 9.7 mg/dL (ref 8.4–10.5)
Creatinine, Ser: 0.57 mg/dL (ref 0.50–1.10)
Sodium: 138 mEq/L (ref 135–145)

## 2013-03-01 LAB — CBC WITH DIFFERENTIAL/PLATELET
Basophils Absolute: 0 10*3/uL (ref 0.0–0.1)
Basophils Relative: 0 % (ref 0–1)
Eosinophils Relative: 1 % (ref 0–5)
HCT: 39.4 % (ref 36.0–46.0)
MCHC: 35.8 g/dL (ref 30.0–36.0)
MCV: 83.7 fL (ref 78.0–100.0)
Monocytes Absolute: 0.7 10*3/uL (ref 0.1–1.0)
Neutro Abs: 3.7 10*3/uL (ref 1.7–7.7)
Platelets: 234 10*3/uL (ref 150–400)
RDW: 12.4 % (ref 11.5–15.5)

## 2013-03-01 MED ORDER — SODIUM CHLORIDE 0.9 % IV BOLUS (SEPSIS)
1000.0000 mL | Freq: Once | INTRAVENOUS | Status: AC
  Administered 2013-03-01: 1000 mL via INTRAVENOUS

## 2013-03-01 MED ORDER — ONDANSETRON HCL 4 MG/2ML IJ SOLN: 4.0000 mg | Freq: Once | INTRAMUSCULAR | Status: AC

## 2013-03-01 MED ORDER — ONDANSETRON HCL 4 MG/2ML IJ SOLN
4.0000 mg | Freq: Once | INTRAMUSCULAR | Status: AC
  Administered 2013-03-01: 4 mg via INTRAVENOUS
  Filled 2013-03-01: qty 2

## 2013-03-01 MED ORDER — KETOROLAC TROMETHAMINE 30 MG/ML IJ SOLN
30.0000 mg | Freq: Once | INTRAMUSCULAR | Status: AC
  Administered 2013-03-01: 30 mg via INTRAVENOUS
  Filled 2013-03-01: qty 1

## 2013-03-01 MED ORDER — FAMOTIDINE IN NACL 20-0.9 MG/50ML-% IV SOLN
20.0000 mg | Freq: Once | INTRAVENOUS | Status: AC
  Administered 2013-03-01: 20 mg via INTRAVENOUS
  Filled 2013-03-01: qty 50

## 2013-03-01 MED ORDER — SODIUM CHLORIDE 0.9 % IV SOLN
1000.0000 mL | Freq: Once | INTRAVENOUS | Status: AC
  Administered 2013-03-01: 1000 mL via INTRAVENOUS

## 2013-03-01 NOTE — ED Notes (Signed)
Pt states she drank alcohol yesterday on an empty stomach. Pt states she has had vomiting and constant nausea ever since. Denies diarrhea. Pt c/o epigastric pain to mid abdomen. Denies radiation. States pain is constant. Pt states she has a ride home.

## 2013-03-01 NOTE — ED Notes (Signed)
PA Albert at bedside. 

## 2013-03-01 NOTE — ED Notes (Signed)
Pt states she was drinking ETOH on Friday, began vomiting Sat. And has been unable to stop.

## 2013-03-01 NOTE — ED Notes (Signed)
Writer gave pt ginger ale.

## 2013-03-01 NOTE — ED Provider Notes (Signed)
History     CSN: [id number]  Arrival date & time 03/01/13  2115   First MD Initiated Contact with Patient 03/01/13 2129      Chief Complaint  Patient presents with  . Emesis    (Consider location/radiation/quality/duration/timing/severity/associated sxs/prior treatment) HPI Comments: 22 year old female with a past medical history of GERD, PTSD, bipolar, depression, anxiety and borderline personality disorder presents to the emergency department complaining of nausea and vomiting x2 days. Patient states she was drinking wine during the day on Saturday when she drank 2 bottles of wine on an empty stomach. Admits to taking all of her psych medications prior to drinking when she was advised to not drink on his medications. Denies suicidal ideation. She began vomiting last night and has been unable to keep anything down since. Tried eating crackers and toast today, however vomited shortly after. Admits to associated midepigastric abdominal pain described as burning, rated 7/10, nonradiating similar to her acid reflux. No diarrhea.  Patient is a 22 y.o. female presenting with vomiting. The history is provided by the patient.  Emesis Associated symptoms: abdominal pain   Associated symptoms: no diarrhea     Past Medical History  Diagnosis Date  . PTSD (post-traumatic stress disorder)   . Bipolar disorder   . Depression   . Anxiety   . Borderline personality disorder     Past Surgical History  Procedure Laterality Date  . Ganglion cyst excision    . Esophagogastroduodenoscopy  06/25/2012    Procedure: ESOPHAGOGASTRODUODENOSCOPY (EGD);  Surgeon: Willis Modena, MD;  Location: [facility];  Service: Endoscopy;  Laterality: N/A;  [name]/ebp    History reviewed. No pertinent family history.  History  Substance Use Topics  . Smoking status: Never Smoker   . Smokeless tobacco: Not on file  . Alcohol Use: No    OB History   Grav Para Term Preterm Abortions TAB SAB Ect Mult Living                   Review of Systems  Constitutional: Positive for appetite change.  Gastrointestinal: Positive for nausea, vomiting and abdominal pain. Negative for diarrhea.  Genitourinary: Negative for dysuria, urgency, frequency, menstrual problem and pelvic pain.  Musculoskeletal: Negative for back pain.  All other systems reviewed and are negative.    Allergies  Hydroxyzine  Home Medications   Current Outpatient Rx  Name  Route  Sig  Dispense  Refill  . dexlansoprazole (DEXILANT) 60 MG capsule   Oral   Take 60 mg by mouth daily.         Marland Kitchen etodolac (LODINE) 400 MG tablet   Oral   Take 400 mg by mouth 3 (three) times daily.         Marland Kitchen HYDROcodone-acetaminophen (VICODIN) 5-500 MG per tablet   Oral   Take 1-2 tablets by mouth every 6 (six) hours as needed for pain.   20 tablet   0   . naltrexone (DEPADE) 50 MG tablet   Oral   Take 25 mg by mouth every morning. To help with opiates withdrawn         . omeprazole (PRILOSEC) 20 MG capsule   Oral   Take 20 mg by mouth daily.         . ondansetron (ZOFRAN) 8 MG tablet   Oral   Take 1 tablet (8 mg total) by mouth every 8 (eight) hours as needed for nausea.   20 tablet   0   . sertraline (ZOLOFT) 100  MG tablet   Oral   Take 100 mg by mouth daily.         Marland Kitchen topiramate (TOPAMAX) 50 MG tablet   Oral   Take 50 mg by mouth at bedtime.         . traZODone (DESYREL) 50 MG tablet   Oral   Take 75 mg by mouth at bedtime.         . ziprasidone (GEODON) 20 MG capsule   Oral   Take 20 mg by mouth 2 (two) times daily with a meal.         . ziprasidone (GEODON) 20 MG capsule   Oral   Take 20 mg by mouth at bedtime.           BP 124/74  Pulse 86  Temp(Src) 98.5 F (36.9 C)  Resp 20  Ht 5' (1.524 m)  Wt 100 lb (45.36 kg)  BMI 19.53 kg/m2  SpO2 99%  Physical Exam  Nursing note and vitals reviewed. Constitutional: She is oriented to person, place, and time. She appears well-developed and  well-nourished. No distress.  HENT:  Head: Normocephalic and atraumatic.  Mouth/Throat: Oropharynx is clear and moist.  Eyes: Conjunctivae are normal. No scleral icterus.  Neck: Normal range of motion. Neck supple.  Cardiovascular: Normal rate, regular rhythm, normal heart sounds and intact distal pulses.   Pulmonary/Chest: Effort normal and breath sounds normal. No respiratory distress.  Abdominal: Soft. Normal appearance and bowel sounds are normal. She exhibits no distension and no mass. There is tenderness in the epigastric area. There is no rigidity, no rebound and no guarding.  Musculoskeletal: Normal range of motion. She exhibits no edema.  Neurological: She is alert and oriented to person, place, and time.  Skin: Skin is warm and dry. She is not diaphoretic.  Psychiatric: Her speech is normal and behavior is normal.  Flat affect.    ED Course  Procedures (including critical care time)  Labs Reviewed  COMPREHENSIVE METABOLIC PANEL - Abnormal; Notable for the following:    Potassium 3.2 (*)    Glucose, Bld 103 (*)    All other components within normal limits  CBC WITH DIFFERENTIAL  LIPASE, BLOOD   No results found.   1. Nausea and vomiting   2. GERD (gastroesophageal reflux disease)       MDM  22 y/o female with n/v after drinking two bottles of wine yesterday. Well appearing, vitals stable, NAD. Labs collected in triage prior to being seen. Patient given IV fluids, zofran. On re-evaluation, patient states she is still nauseated and vomiting. Emesis bag empty. Will give another dose of zofran. She has hx of GERD. Will give pepcid to see if her abdominal burning sensation improves.  12:31 AM Nausea with some improvement. States she still has some abdominal burning but with improvement. Tolerated PO fluids and crackers without vomiting. I advised her to avoid alcohol, suggested bland diet. She is stable for discharge. Return precautions discussed. Patient states  understanding of plan and is agreeable.   Trevor Mace, PA-C 03/02/13 0033  Trevor Mace, PA-C 03/02/13 [phone]

## 2013-03-02 MED ORDER — FAMOTIDINE 20 MG PO TABS: 20.0000 mg | ORAL_TABLET | Freq: Two times a day (BID) | ORAL | Status: DC

## 2013-03-02 MED ORDER — ONDANSETRON HCL 4 MG PO TABS: 4.0000 mg | ORAL_TABLET | Freq: Four times a day (QID) | ORAL | Status: DC

## 2013-03-05 NOTE — ED Provider Notes (Signed)
Medical screening examination/treatment/procedure(s) were performed by non-physician practitioner and as supervising physician I was immediately available for consultation/collaboration.   Gladys Gutman J. Willy Vorce, MD 03/05/13 1700 

## 2013-05-25 ENCOUNTER — Encounter (HOSPITAL_COMMUNITY): Payer: Self-pay

## 2013-05-25 ENCOUNTER — Emergency Department (HOSPITAL_COMMUNITY)
Admission: EM | Admit: 2013-05-25 | Discharge: 2013-05-26 | Disposition: A | Discharge status: Home / Self Care | Payer: Medicaid Other | Attending: Emergency Medicine | Admitting: Emergency Medicine

## 2013-05-25 DIAGNOSIS — Z8659 Personal history of other mental and behavioral disorders: Secondary | ICD-10-CM | POA: Insufficient documentation

## 2013-05-25 DIAGNOSIS — T50901A Poisoning by unspecified drugs, medicaments and biological substances, accidental (unintentional), initial encounter: Secondary | ICD-10-CM

## 2013-05-25 DIAGNOSIS — Z888 Allergy status to other drugs, medicaments and biological substances status: Secondary | ICD-10-CM | POA: Insufficient documentation

## 2013-05-25 DIAGNOSIS — T43201A Poisoning by unspecified antidepressants, accidental (unintentional), initial encounter: Secondary | ICD-10-CM | POA: Insufficient documentation

## 2013-05-25 DIAGNOSIS — F411 Generalized anxiety disorder: Secondary | ICD-10-CM | POA: Insufficient documentation

## 2013-05-25 DIAGNOSIS — Y9389 Activity, other specified: Secondary | ICD-10-CM | POA: Insufficient documentation

## 2013-05-25 DIAGNOSIS — Y929 Unspecified place or not applicable: Secondary | ICD-10-CM | POA: Insufficient documentation

## 2013-05-25 DIAGNOSIS — R110 Nausea: Secondary | ICD-10-CM | POA: Insufficient documentation

## 2013-05-25 DIAGNOSIS — F4310 Post-traumatic stress disorder, unspecified: Secondary | ICD-10-CM | POA: Insufficient documentation

## 2013-05-25 DIAGNOSIS — T43294A Poisoning by other antidepressants, undetermined, initial encounter: Secondary | ICD-10-CM | POA: Insufficient documentation

## 2013-05-25 DIAGNOSIS — Z79899 Other long term (current) drug therapy: Secondary | ICD-10-CM | POA: Insufficient documentation

## 2013-05-25 DIAGNOSIS — F319 Bipolar disorder, unspecified: Secondary | ICD-10-CM | POA: Insufficient documentation

## 2013-05-25 LAB — CBC WITH DIFFERENTIAL/PLATELET
Basophils Absolute: 0 10*3/uL (ref 0.0–0.1)
Basophils Relative: 0 % (ref 0–1)
Eosinophils Absolute: 0.1 10*3/uL (ref 0.0–0.7)
Eosinophils Relative: 2 % (ref 0–5)
HCT: 40.9 % (ref 36.0–46.0)
Hemoglobin: 14.1 g/dL (ref 12.0–15.0)
Lymphocytes Relative: 40 % (ref 12–46)
Lymphs Abs: 3.4 10*3/uL (ref 0.7–4.0)
MCH: 29.4 pg (ref 26.0–34.0)
MCHC: 34.5 g/dL (ref 30.0–36.0)
MCV: 85.4 fL (ref 78.0–100.0)
Monocytes Absolute: 0.6 10*3/uL (ref 0.1–1.0)
Monocytes Relative: 7 % (ref 3–12)
Neutro Abs: 4.4 10*3/uL (ref 1.7–7.7)
Neutrophils Relative %: 51 % (ref 43–77)
Platelets: 227 10*3/uL (ref 150–400)
RBC: 4.79 MIL/uL (ref 3.87–5.11)
RDW: 12.3 % (ref 11.5–15.5)
WBC: 8.6 10*3/uL (ref 4.0–10.5)

## 2013-05-25 LAB — URINALYSIS, ROUTINE W REFLEX MICROSCOPIC
Bilirubin Urine: NEGATIVE
Glucose, UA: NEGATIVE mg/dL
Hgb urine dipstick: NEGATIVE
Ketones, ur: NEGATIVE mg/dL
Leukocytes, UA: NEGATIVE
Nitrite: NEGATIVE
Protein, ur: NEGATIVE mg/dL
Specific Gravity, Urine: 1.019 (ref 1.005–1.030)
Urobilinogen, UA: 0.2 mg/dL (ref 0.0–1.0)
pH: 6 (ref 5.0–8.0)

## 2013-05-25 LAB — COMPREHENSIVE METABOLIC PANEL
ALT: 16 U/L (ref 0–35)
AST: 19 U/L (ref 0–37)
Albumin: 3.8 g/dL (ref 3.5–5.2)
Alkaline Phosphatase: 95 U/L (ref 39–117)
BUN: 8 mg/dL (ref 6–23)
CO2: 21 mEq/L (ref 19–32)
Calcium: 9.9 mg/dL (ref 8.4–10.5)
Chloride: 107 mEq/L (ref 96–112)
Creatinine, Ser: 0.71 mg/dL (ref 0.50–1.10)
GFR calc Af Amer: >90 mL/min (ref >90)
GFR calc non Af Amer: >90 mL/min (ref >90)
Glucose, Bld: 127 mg/dL — ABNORMAL HIGH (ref 70–99)
Potassium: 3.3 mEq/L — ABNORMAL LOW (ref 3.5–5.1)
Sodium: 140 mEq/L (ref 135–145)
Total Bilirubin: 0.2 mg/dL — ABNORMAL LOW (ref 0.3–1.2)
Total Protein: 7.5 g/dL (ref 6.0–8.3)

## 2013-05-25 LAB — RAPID URINE DRUG SCREEN, HOSP PERFORMED
Amphetamines: NOT DETECTED
Barbiturates: NOT DETECTED
Benzodiazepines: NOT DETECTED
Cocaine: NOT DETECTED
Opiates: NOT DETECTED
Tetrahydrocannabinol: NOT DETECTED

## 2013-05-25 LAB — ETHANOL: Alcohol, Ethyl (B): <11 mg/dL (ref 0–11)

## 2013-05-25 NOTE — ED Notes (Signed)
Pt took an extra dose of her trazadone by accident

## 2013-05-25 NOTE — ED Notes (Signed)
Pt tolerate PO's well.

## 2013-05-25 NOTE — ED Notes (Signed)
Pt BIB EMS. Pt states she took Trazadone 200mg  at 1900 which is twice her normal dose. Pt told EMS that she was tired and not paying attention and accidentally took too much. Pt ambulatory from ambulance to exam room with steady gait. Pt a/o x 4. Pt with no acute distress. Calm, cooperative.

## 2013-05-26 NOTE — ED Provider Notes (Signed)
CSN: [id number]     Arrival date & time 05/25/13  2155 History     First MD Initiated Contact with Patient 05/25/13 2202     Chief Complaint  Patient presents with  . Ingestion   (Consider location/radiation/quality/duration/timing/severity/associated sxs/prior Treatment) HPI Patient presents emergency department with an ingestion of an extra trazodone.  Patient, states she inadvertently took an extra trazodone tonight.  She states that she has not tried to harm herself.  Patient states that she just has increased sleepiness and some nausea.  Patient denies chest pain, shortness of breath, headache, blurred vision, weakness, fever, back pain, dysuria, incontinence, or syncope.  Patient, states, that he did not take any other medications more than once. Past Medical History  Diagnosis Date  . PTSD (post-traumatic stress disorder)   . Bipolar disorder   . Depression   . Anxiety   . Borderline personality disorder    Past Surgical History  Procedure Laterality Date  . Ganglion cyst excision    . Esophagogastroduodenoscopy  06/25/2012    Procedure: ESOPHAGOGASTRODUODENOSCOPY (EGD);  Surgeon: Willis Modena, MD;  Location: [facility];  Service: Endoscopy;  Laterality: N/A;  [name]/ebp   History reviewed. No pertinent family history. History  Substance Use Topics  . Smoking status: Never Smoker   . Smokeless tobacco: Not on file  . Alcohol Use: No   OB History   Grav Para Term Preterm Abortions TAB SAB Ect Mult Living                 Review of Systems All other systems negative except as documented in the HPI. All pertinent positives and negatives as reviewed in the HPI. Allergies  Hydroxyzine  Home Medications   Current Outpatient Rx  Name  Route  Sig  Dispense  Refill  . esomeprazole (NEXIUM) 20 MG capsule   Oral   Take 20 mg by mouth at bedtime.         . hyoscyamine (ANASPAZ) 0.125 MG TBDP tablet   Sublingual   Place 0.125 mg under the tongue 2 (two) times  daily.         . sertraline (ZOLOFT) 100 MG tablet   Oral   Take 100 mg by mouth daily.         . sucralfate (CARAFATE) 1 G tablet   Oral   Take 1 g by mouth 4 (four) times daily.         Marland Kitchen topiramate (TOPAMAX) 25 MG tablet   Oral   Take 25 mg by mouth daily.         . traZODone (DESYREL) 50 MG tablet   Oral   Take 25 mg by mouth at bedtime. Takes 1/2 of a 50mg  to equal 25mg          . ziprasidone (GEODON) 40 MG capsule   Oral   Take 40 mg by mouth daily with supper.          BP 107/63  Pulse 88  Temp(Src) 98.3 F (36.8 C) (Oral)  Resp 24  Ht 5\' 1"  (1.549 m)  Wt 128 lb (58.06 kg)  BMI 24.2 kg/m2  SpO2 98% Physical Exam  Nursing note and vitals reviewed. Constitutional: She is oriented to person, place, and time. She appears well-developed and well-nourished. No distress.  HENT:  Head: Normocephalic and atraumatic.  Mouth/Throat: Oropharynx is clear and moist.  Eyes: Pupils are equal, round, and reactive to light.  Neck: Normal range of motion. Neck supple.  Cardiovascular: Normal rate, regular  rhythm and normal heart sounds.  Exam reveals no gallop and no friction rub.   No murmur heard. Pulmonary/Chest: Effort normal and breath sounds normal.  Neurological: She is alert and oriented to person, place, and time. She exhibits normal muscle tone. Coordination normal.  Skin: Skin is warm and dry.    ED Course   Procedures (including critical care time)  Labs Reviewed  COMPREHENSIVE METABOLIC PANEL - Abnormal; Notable for the following:    Potassium 3.3 (*)    Glucose, Bld 127 (*)    Total Bilirubin 0.2 (*)    All other components within normal limits  CBC WITH DIFFERENTIAL  URINALYSIS, ROUTINE W REFLEX MICROSCOPIC  URINE RAPID DRUG SCREEN (HOSP PERFORMED)  ETHANOL   Patient has tolerated oral fluids and monitor here in the emergency patient is stable for discharge at this time.  Patient is advised return here for any worsening in her condition.   Also advised patient to make sure, that she's more careful in the future when taking her medications.  MDM    Carlyle Dolly, PA-C 05/26/13 0020

## 2013-05-28 NOTE — ED Provider Notes (Signed)
Medical screening examination/treatment/procedure(s) were performed by non-physician practitioner and as supervising physician I was immediately available for consultation/collaboration.  Renzo Vincelette, MD 05/28/13 1104 

## 2013-09-07 ENCOUNTER — Other Ambulatory Visit (HOSPITAL_COMMUNITY)
Admission: RE | Admit: 2013-09-07 | Discharge: 2013-09-07 | Disposition: A | Discharge status: Home / Self Care | Payer: Medicaid Other | Source: Ambulatory Visit | Attending: Obstetrics & Gynecology | Admitting: Obstetrics & Gynecology

## 2013-09-07 ENCOUNTER — Other Ambulatory Visit: Payer: Self-pay | Admitting: Obstetrics & Gynecology

## 2013-09-07 DIAGNOSIS — N760 Acute vaginitis: Secondary | ICD-10-CM | POA: Insufficient documentation

## 2013-09-07 DIAGNOSIS — Z01419 Encounter for gynecological examination (general) (routine) without abnormal findings: Secondary | ICD-10-CM | POA: Insufficient documentation

## 2013-09-07 DIAGNOSIS — Z113 Encounter for screening for infections with a predominantly sexual mode of transmission: Secondary | ICD-10-CM | POA: Insufficient documentation

## 2014-03-11 DIAGNOSIS — F321 Major depressive disorder, single episode, moderate: Secondary | ICD-10-CM | POA: Diagnosis not present

## 2014-03-11 DIAGNOSIS — F4310 Post-traumatic stress disorder, unspecified: Secondary | ICD-10-CM | POA: Diagnosis not present

## 2014-04-16 ENCOUNTER — Emergency Department (HOSPITAL_COMMUNITY)
Admission: EM | Admit: 2014-04-16 | Discharge: 2014-04-17 | Disposition: A | Discharge status: Home / Self Care | Payer: Commercial Managed Care - PPO | Attending: Emergency Medicine | Admitting: Emergency Medicine

## 2014-04-16 ENCOUNTER — Encounter (HOSPITAL_COMMUNITY): Payer: Self-pay | Admitting: Emergency Medicine

## 2014-04-16 ENCOUNTER — Emergency Department (HOSPITAL_COMMUNITY): Payer: Commercial Managed Care - PPO

## 2014-04-16 DIAGNOSIS — A599 Trichomoniasis, unspecified: Secondary | ICD-10-CM

## 2014-04-16 DIAGNOSIS — R1084 Generalized abdominal pain: Secondary | ICD-10-CM

## 2014-04-16 DIAGNOSIS — Z79899 Other long term (current) drug therapy: Secondary | ICD-10-CM | POA: Insufficient documentation

## 2014-04-16 DIAGNOSIS — R112 Nausea with vomiting, unspecified: Secondary | ICD-10-CM | POA: Insufficient documentation

## 2014-04-16 DIAGNOSIS — Z9889 Other specified postprocedural states: Secondary | ICD-10-CM | POA: Insufficient documentation

## 2014-04-16 DIAGNOSIS — F411 Generalized anxiety disorder: Secondary | ICD-10-CM | POA: Insufficient documentation

## 2014-04-16 DIAGNOSIS — Z3202 Encounter for pregnancy test, result negative: Secondary | ICD-10-CM | POA: Insufficient documentation

## 2014-04-16 DIAGNOSIS — F319 Bipolar disorder, unspecified: Secondary | ICD-10-CM | POA: Insufficient documentation

## 2014-04-16 DIAGNOSIS — R109 Unspecified abdominal pain: Secondary | ICD-10-CM | POA: Diagnosis not present

## 2014-04-16 DIAGNOSIS — A5901 Trichomonal vulvovaginitis: Secondary | ICD-10-CM | POA: Insufficient documentation

## 2014-04-16 LAB — COMPREHENSIVE METABOLIC PANEL
ALT: 13 U/L (ref 0–35)
AST: 18 U/L (ref 0–37)
Albumin: 3.9 g/dL (ref 3.5–5.2)
Alkaline Phosphatase: 70 U/L (ref 39–117)
BILIRUBIN TOTAL: 0.2 mg/dL — AB (ref 0.3–1.2)
BUN: 8 mg/dL (ref 6–23)
CALCIUM: 9.7 mg/dL (ref 8.4–10.5)
CO2: 25 meq/L (ref 19–32)
CREATININE: 0.59 mg/dL (ref 0.50–1.10)
Chloride: 104 mEq/L (ref 96–112)
GLUCOSE: 106 mg/dL — AB (ref 70–99)
Potassium: 3.5 mEq/L — ABNORMAL LOW (ref 3.7–5.3)
Sodium: 140 mEq/L (ref 137–147)
Total Protein: 7.2 g/dL (ref 6.0–8.3)

## 2014-04-16 LAB — URINALYSIS, ROUTINE W REFLEX MICROSCOPIC
Bilirubin Urine: NEGATIVE
Glucose, UA: NEGATIVE mg/dL
HGB URINE DIPSTICK: NEGATIVE
Ketones, ur: NEGATIVE mg/dL
Nitrite: NEGATIVE
PROTEIN: NEGATIVE mg/dL
Specific Gravity, Urine: 1.013 (ref 1.005–1.030)
UROBILINOGEN UA: 0.2 mg/dL (ref 0.0–1.0)
pH: 6 (ref 5.0–8.0)

## 2014-04-16 LAB — CBC WITH DIFFERENTIAL/PLATELET
Basophils Absolute: 0 10*3/uL (ref 0.0–0.1)
Basophils Relative: 0 % (ref 0–1)
EOS PCT: 1 % (ref 0–5)
Eosinophils Absolute: 0.1 10*3/uL (ref 0.0–0.7)
HEMATOCRIT: 38.1 % (ref 36.0–46.0)
HEMOGLOBIN: 13.3 g/dL (ref 12.0–15.0)
LYMPHS ABS: 4.6 10*3/uL — AB (ref 0.7–4.0)
LYMPHS PCT: 49 % — AB (ref 12–46)
MCH: 29.7 pg (ref 26.0–34.0)
MCHC: 34.9 g/dL (ref 30.0–36.0)
MCV: 85 fL (ref 78.0–100.0)
MONO ABS: 0.7 10*3/uL (ref 0.1–1.0)
MONOS PCT: 8 % (ref 3–12)
Neutro Abs: 4 10*3/uL (ref 1.7–7.7)
Neutrophils Relative %: 42 % — ABNORMAL LOW (ref 43–77)
Platelets: 213 10*3/uL (ref 150–400)
RBC: 4.48 MIL/uL (ref 3.87–5.11)
RDW: 12.6 % (ref 11.5–15.5)
WBC: 9.4 10*3/uL (ref 4.0–10.5)

## 2014-04-16 LAB — URINE MICROSCOPIC-ADD ON

## 2014-04-16 LAB — POC URINE PREG, ED: Preg Test, Ur: NEGATIVE

## 2014-04-16 MED ORDER — AZITHROMYCIN 250 MG PO TABS
1000.0000 mg | ORAL_TABLET | Freq: Once | ORAL | Status: AC
  Administered 2014-04-17: 1000 mg via ORAL
  Filled 2014-04-16: qty 4

## 2014-04-16 MED ORDER — METRONIDAZOLE 500 MG PO TABS: 500.0000 mg | ORAL_TABLET | Freq: Two times a day (BID) | ORAL | Status: DC

## 2014-04-16 MED ORDER — CEFTRIAXONE SODIUM 250 MG IJ SOLR
250.0000 mg | Freq: Once | INTRAMUSCULAR | Status: AC
  Administered 2014-04-17: 250 mg via INTRAMUSCULAR
  Filled 2014-04-16: qty 250

## 2014-04-16 MED ORDER — DICYCLOMINE HCL 20 MG PO TABS: 20.0000 mg | ORAL_TABLET | Freq: Three times a day (TID) | ORAL | Status: DC

## 2014-04-16 NOTE — Discharge Instructions (Signed)
Please read and follow all provided instructions.  Your diagnoses today include:  1. Generalized abdominal pain   2. Trichomonas infection     Tests performed today include:  Test for gonorrhea and chlamydia. You will be notified by telephone if you have a positive result.  Vital signs. See below for your results today.   Medications:  You were treated for chlamydia (1 gram azithromycin pills) and gonorrhea (250mg  rocephin shot).   Metronidazole - antibiotic  You have been prescribed an antibiotic medicine: take the entire course of medicine even if you are feeling better. Stopping early can cause the antibiotic not to work. Do not drink alcohol when taking this medication.   Home care instructions:  Read educational materials contained in this packet and follow any instructions provided.   You should tell your partners about your infection and avoid having sex for one week to allow time for the medicine to work.  Follow-up instructions: You should follow-up with the [facility]D clinic to be tested for HIV, syphilis, and hepatitis -- all of which can be transmitted by sexual contact. We do not routinely screen for these in the Emergency Department.  STD Testing:  [place] [place] [facility]  [address], [place], phone [phone] or [phone]    Monday - Friday, call for an appointment  [place] [place] [facility]  [address], [place], phone [phone] or [phone]   Monday - Friday, call for an appointment  Return instructions:   Please return to the Emergency Department if you experience worsening symptoms.   Please return if you have any other emergent concerns.  Additional Information:  Your vital signs today were: BP 94/67   Pulse 95   Temp(Src) 98.7 F (37.1 C) (Oral)   Resp 16   SpO2 100% If your blood pressure (BP) was elevated above 135/85 this  visit, please have this repeated by your doctor within one month. --------------

## 2014-04-16 NOTE — ED Notes (Signed)
Pt states she has air trapped in her body and she cannot get it to come out  Pt states she has felt this way for weeks  Pt states she has tried gas x, and essential enzymes, and baking soda without relief  Pt states she feels so full she feels like she is going to explode

## 2014-04-16 NOTE — ED Provider Notes (Signed)
CSN: [id number]     Arrival date & time 04/16/14  2019 History   First MD Initiated Contact with Patient 04/16/14 2058     Chief Complaint  Patient presents with  . Abdominal Pain     (Consider location/radiation/quality/duration/timing/severity/associated sxs/prior Treatment) HPI Comments: Patient presents with complaint of abdominal bloating and tenderness she has had for the past 4 years, but this is been worsening past week. Patient states that she has seen several doctors in the past including primary care and gastroenterology. She has had a colonoscopy. She is unsure if she has had an EGD. She has been on PPI in the past but states this has not helped. She is currently on Carafate that is not helping. She also takes an over-the-counter supplement that contains among other ingredients lipase and lactase. She denies fever. She vomited today after ingesting baking soda. No urinary symptoms. No vaginal discharge or bleeding. The onset of this condition was insidious. Aggravating factors: none. Alleviating factors: none.    Patient is a 23 y.o. female presenting with abdominal pain. The history is provided by the patient.  Abdominal Pain Associated symptoms: nausea and vomiting   Associated symptoms: no chest pain, no cough, no diarrhea, no dysuria, no fever, no sore throat, no vaginal bleeding and no vaginal discharge     Past Medical History  Diagnosis Date  . PTSD (post-traumatic stress disorder)   . Bipolar disorder   . Depression   . Anxiety   . Borderline personality disorder    Past Surgical History  Procedure Laterality Date  . Ganglion cyst excision    . Esophagogastroduodenoscopy  06/25/2012    Procedure: ESOPHAGOGASTRODUODENOSCOPY (EGD);  Surgeon: Arta Silence, MD;  Location: [facility];  Service: Endoscopy;  Laterality: N/A;  [name]/ebp   Family History  Problem Relation Age of Onset  . Adopted: Yes   History  Substance Use Topics  . Smoking status: Never  Smoker   . Smokeless tobacco: Not on file  . Alcohol Use: No   OB History   Grav Para Term Preterm Abortions TAB SAB Ect Mult Living                 Review of Systems  Constitutional: Negative for fever.  HENT: Negative for rhinorrhea and sore throat.   Eyes: Negative for redness.  Respiratory: Negative for cough.   Cardiovascular: Negative for chest pain.  Gastrointestinal: Positive for nausea, vomiting and abdominal pain. Negative for diarrhea and blood in stool.  Genitourinary: Negative for dysuria, vaginal bleeding and vaginal discharge.  Musculoskeletal: Negative for myalgias.  Skin: Negative for rash.  Neurological: Negative for headaches.   Allergies  Hydroxyzine  Home Medications   Prior to Admission medications   Medication Sig Start Date End Date Taking? Authorizing Provider  ibuprofen (ADVIL,MOTRIN) 200 MG tablet Take 400 mg by mouth every 6 (six) hours as needed (pain).   Yes Historical Provider, MD  levonorgestrel (MIRENA) 20 MCG/24HR IUD 1 each by Intrauterine route once.   Yes Historical Provider, MD  sucralfate (CARAFATE) 1 G tablet Take 1 g by mouth 4 (four) times daily.   Yes Historical Provider, MD  topiramate (TOPAMAX) 25 MG tablet Take 25 mg by mouth daily.   Yes Historical Provider, MD  traZODone (DESYREL) 50 MG tablet Take 25 mg by mouth at bedtime. Takes 1/2 of a 50mg  to equal 25mg    Yes Historical Provider, MD  ziprasidone (GEODON) 40 MG capsule Take 40 mg by mouth daily with supper.  Yes Historical Provider, MD   BP 122/76  Pulse 85  Temp(Src) 98.6 F (37 C) (Oral)  Resp 18  SpO2 100%  Physical Exam  Nursing note and vitals reviewed. Constitutional: She appears well-developed and well-nourished.  HENT:  Head: Normocephalic and atraumatic.  Eyes: Conjunctivae are normal. Right eye exhibits no discharge. Left eye exhibits no discharge.  Neck: Normal range of motion. Neck supple.  Cardiovascular: Normal rate, regular rhythm and normal heart  sounds.   No murmur heard. Pulmonary/Chest: Effort normal and breath sounds normal. No respiratory distress. She has no wheezes. She has no rales.  Abdominal: Soft. Bowel sounds are normal. There is no tenderness. There is no rebound and no guarding.  Neurological: She is alert.  Skin: Skin is warm and dry.  Psychiatric: She has a normal mood and affect.    ED Course  Procedures (including critical care time) Labs Review Labs Reviewed  URINALYSIS, ROUTINE W REFLEX MICROSCOPIC - Abnormal; Notable for the following:    Leukocytes, UA TRACE (*)    All other components within normal limits  CBC WITH DIFFERENTIAL - Abnormal; Notable for the following:    Neutrophils Relative % 42 (*)    Lymphocytes Relative 49 (*)    Lymphs Abs 4.6 (*)    All other components within normal limits  COMPREHENSIVE METABOLIC PANEL - Abnormal; Notable for the following:    Potassium 3.5 (*)    Glucose, Bld 106 (*)    Total Bilirubin 0.2 (*)    All other components within normal limits  URINE MICROSCOPIC-ADD ON  POC URINE PREG, ED    Imaging Review Dg Abd 2 Views  04/16/2014   CLINICAL DATA:  Abdominal pain and bloating for 1 week  EXAM: ABDOMEN - 2 VIEW  COMPARISON:  CT abdomen and pelvis 08/15/2012  FINDINGS: There is no evidence of intraperitoneal free air. No bowel fluid levels are seen. There is a small to moderate amount of stool in the colon, with a paucity of small bowel gas. No abnormal calcification is seen. An intrauterine contraceptive device is noted. No acute osseous abnormality is identified.  IMPRESSION: Small to moderate amount of colonic stool. Paucity of small bowel gas without other evidence of obstruction.   Electronically Signed   By: Logan Bores   On: 04/16/2014 21:43     EKG Interpretation None      9:28 PM Patient seen and examined. Work-up initiated.   Vital signs reviewed and are as follows: Filed Vitals:   04/16/14 2030  BP: 122/76  Pulse: 85  Temp: 98.6 F (37 C)   Resp: 18   12:02 AM Patient informed of results, including trichomoniasis. Offered empiric treatment for GC/chlamydia. She accepted.   Will test and treat for STD exposure. Patient counseled on safe sexual practices. Told them that they should not have sexual contact for next 7 days and that they need to inform sexual partners so that they can get tested and treated as well. Urged f/u with [facility] for HIV and syphilis testing. Patient verbalizes understanding and agrees with plan.    Will give bentyl for home. GI and PCP referral given.   The patient was urged to return to the Emergency Department immediately with worsening of current symptoms, worsening abdominal pain, persistent vomiting, blood noted in stools, fever, or any other concerns. The patient verbalized understanding.   MDM   Final diagnoses:  Generalized abdominal pain  Trichomonas infection   Patient with abdominal symptoms x  4 years with multiple work-ups. Evaluation tonight does not show any acute medical emergencies that require intervention. Her abd is soft and non-tender on exam. GI referral given for patient's chronic symptoms. She has not tried bentyl. Will give trial.   Trichomonas noted. Rx for home. Tx for Gc/Chlamydia.   No dangerous or life-threatening conditions suspected or identified by history, physical exam, and by work-up. No indications for hospitalization identified.    Carlisle Cater, PA-C 04/17/14 0008

## 2014-04-17 MED ORDER — STERILE WATER FOR INJECTION IJ SOLN
INTRAMUSCULAR | Status: AC
  Administered 2014-04-17: 10 mL
  Filled 2014-04-17: qty 10

## 2014-04-18 NOTE — ED Provider Notes (Signed)
**Note De-Identified  Obfuscation** Medical screening examination/treatment/procedure(s) were performed by non-physician practitioner and as supervising physician I was immediately available for consultation/collaboration.   Dot Lanes, MD 04/18/14 1302

## 2014-05-06 DIAGNOSIS — F321 Major depressive disorder, single episode, moderate: Secondary | ICD-10-CM | POA: Diagnosis not present

## 2014-05-06 DIAGNOSIS — F603 Borderline personality disorder: Secondary | ICD-10-CM | POA: Diagnosis not present

## 2014-05-06 DIAGNOSIS — F4310 Post-traumatic stress disorder, unspecified: Secondary | ICD-10-CM | POA: Diagnosis not present

## 2014-05-23 ENCOUNTER — Encounter (HOSPITAL_COMMUNITY): Payer: Self-pay | Admitting: Emergency Medicine

## 2014-05-23 ENCOUNTER — Emergency Department (INDEPENDENT_AMBULATORY_CARE_PROVIDER_SITE_OTHER)
Admission: EM | Admit: 2014-05-23 | Discharge: 2014-05-23 | Disposition: A | Discharge status: Home / Self Care | Payer: Commercial Managed Care - PPO | Source: Home / Self Care | Attending: Family Medicine | Admitting: Family Medicine

## 2014-05-23 DIAGNOSIS — R768 Other specified abnormal immunological findings in serum: Secondary | ICD-10-CM

## 2014-05-23 DIAGNOSIS — R894 Abnormal immunological findings in specimens from other organs, systems and tissues: Secondary | ICD-10-CM

## 2014-05-23 LAB — POCT URINALYSIS DIP (DEVICE)
BILIRUBIN URINE: NEGATIVE
GLUCOSE, UA: NEGATIVE mg/dL
Hgb urine dipstick: NEGATIVE
Ketones, ur: 40 mg/dL — AB
LEUKOCYTES UA: NEGATIVE
NITRITE: NEGATIVE
PH: 6 (ref 5.0–8.0)
Protein, ur: NEGATIVE mg/dL
Specific Gravity, Urine: 1.025 (ref 1.005–1.030)
UROBILINOGEN UA: 0.2 mg/dL (ref 0.0–1.0)

## 2014-05-23 LAB — POCT PREGNANCY, URINE: Preg Test, Ur: NEGATIVE

## 2014-05-23 LAB — POCT H PYLORI SCREEN: H. PYLORI SCREEN, POC: POSITIVE — AB

## 2014-05-23 MED ORDER — AMOXICILL-CLARITHRO-LANSOPRAZ PO MISC: Freq: Two times a day (BID) | ORAL | Status: DC

## 2014-05-23 NOTE — ED Notes (Signed)
Patient reports she feels bloated every since she had a stomach infection about 8 months ago. Patient reports she 'hasn't been able to keep any foods down" no diarrhea but vomits after eating. Patient is alert and oriented and in no acute distress.

## 2014-05-23 NOTE — Discharge Instructions (Signed)
Take medicine as prescribed.

## 2014-05-23 NOTE — ED Provider Notes (Signed)
CSN: [id number]     Arrival date & time 05/23/14  1530 History   First MD Initiated Contact with Patient 05/23/14 1631     Chief Complaint  Patient presents with  . Bloated   (Consider location/radiation/quality/duration/timing/severity/associated sxs/prior Treatment) HPI  Past Medical History  Diagnosis Date  . PTSD (post-traumatic stress disorder)   . Bipolar disorder   . Depression   . Anxiety   . Borderline personality disorder    Past Surgical History  Procedure Laterality Date  . Ganglion cyst excision    . Esophagogastroduodenoscopy  06/25/2012    Procedure: ESOPHAGOGASTRODUODENOSCOPY (EGD);  Surgeon: Arta Silence, MD;  Location: [facility];  Service: Endoscopy;  Laterality: N/A;  [name]/ebp   Family History  Problem Relation Age of Onset  . Adopted: Yes   History  Substance Use Topics  . Smoking status: Never Smoker   . Smokeless tobacco: Not on file  . Alcohol Use: No   OB History   Grav Para Term Preterm Abortions TAB SAB Ect Mult Living                 Review of Systems  Allergies  Hydroxyzine  Home Medications   Prior to Admission medications   Medication Sig Start Date End Date Taking? Authorizing Provider  amoxicillin-clarithromycin-lansoprazole [facility]) combo pack Take by mouth 2 (two) times daily. Follow package directions. 05/23/14   Billy Fischer, MD  dicyclomine (BENTYL) 20 MG tablet Take 1 tablet (20 mg total) by mouth 3 (three) times daily. 04/16/14   Carlisle Cater, PA-C  ibuprofen (ADVIL,MOTRIN) 200 MG tablet Take 400 mg by mouth every 6 (six) hours as needed (pain).    Historical Provider, MD  levonorgestrel (MIRENA) 20 MCG/24HR IUD 1 each by Intrauterine route once.    Historical Provider, MD  metroNIDAZOLE (FLAGYL) 500 MG tablet Take 1 tablet (500 mg total) by mouth 2 (two) times daily. 04/16/14   Carlisle Cater, PA-C  sucralfate (CARAFATE) 1 G tablet Take 1 g by mouth 4 (four) times daily.    Historical Provider, MD  topiramate (TOPAMAX)  25 MG tablet Take 25 mg by mouth daily.    Historical Provider, MD  traZODone (DESYREL) 50 MG tablet Take 25 mg by mouth at bedtime. Takes 1/2 of a 50mg  to equal 25mg     Historical Provider, MD  ziprasidone (GEODON) 40 MG capsule Take 40 mg by mouth daily with supper.    Historical Provider, MD   BP 108/75  Pulse 83  Temp(Src) 98.8 F (37.1 C) (Oral)  Resp 16  SpO2 99% Physical Exam  ED Course  Procedures (including critical care time) Labs Review Labs Reviewed  POCT H PYLORI SCREEN - Abnormal; Notable for the following:    H. PYLORI SCREEN, POC POSITIVE (*)    All other components within normal limits  POCT URINALYSIS DIP (DEVICE) - Abnormal; Notable for the following:    Ketones, ur 40 (*)    All other components within normal limits  POCT PREGNANCY, URINE    Imaging Review No results found.   MDM   1. Helicobacter pylori antibody positive        Billy Fischer, MD 05/23/14 [phone]

## 2014-07-02 DIAGNOSIS — F4310 Post-traumatic stress disorder, unspecified: Secondary | ICD-10-CM | POA: Diagnosis not present

## 2014-07-02 DIAGNOSIS — F321 Major depressive disorder, single episode, moderate: Secondary | ICD-10-CM | POA: Diagnosis not present

## 2014-07-02 DIAGNOSIS — F603 Borderline personality disorder: Secondary | ICD-10-CM | POA: Diagnosis not present

## 2014-08-30 DIAGNOSIS — F321 Major depressive disorder, single episode, moderate: Secondary | ICD-10-CM | POA: Diagnosis not present

## 2014-08-30 DIAGNOSIS — F4310 Post-traumatic stress disorder, unspecified: Secondary | ICD-10-CM | POA: Diagnosis not present

## 2014-08-30 DIAGNOSIS — F603 Borderline personality disorder: Secondary | ICD-10-CM | POA: Diagnosis not present

## 2014-09-27 DIAGNOSIS — F4310 Post-traumatic stress disorder, unspecified: Secondary | ICD-10-CM | POA: Diagnosis not present

## 2014-09-27 DIAGNOSIS — F321 Major depressive disorder, single episode, moderate: Secondary | ICD-10-CM | POA: Diagnosis not present

## 2014-09-27 DIAGNOSIS — F603 Borderline personality disorder: Secondary | ICD-10-CM | POA: Diagnosis not present

## 2014-10-14 DIAGNOSIS — K219 Gastro-esophageal reflux disease without esophagitis: Secondary | ICD-10-CM | POA: Diagnosis not present

## 2014-10-14 DIAGNOSIS — N760 Acute vaginitis: Secondary | ICD-10-CM | POA: Diagnosis not present

## 2014-11-26 DIAGNOSIS — D229 Melanocytic nevi, unspecified: Secondary | ICD-10-CM | POA: Diagnosis not present

## 2014-12-06 DIAGNOSIS — F603 Borderline personality disorder: Secondary | ICD-10-CM | POA: Diagnosis not present

## 2014-12-06 DIAGNOSIS — F321 Major depressive disorder, single episode, moderate: Secondary | ICD-10-CM | POA: Diagnosis not present

## 2015-01-21 DIAGNOSIS — D2272 Melanocytic nevi of left lower limb, including hip: Secondary | ICD-10-CM | POA: Diagnosis not present

## 2015-01-21 DIAGNOSIS — D225 Melanocytic nevi of trunk: Secondary | ICD-10-CM | POA: Diagnosis not present

## 2015-02-11 DIAGNOSIS — F4310 Post-traumatic stress disorder, unspecified: Secondary | ICD-10-CM | POA: Diagnosis not present

## 2015-02-11 DIAGNOSIS — F321 Major depressive disorder, single episode, moderate: Secondary | ICD-10-CM | POA: Diagnosis not present

## 2015-04-08 DIAGNOSIS — F321 Major depressive disorder, single episode, moderate: Secondary | ICD-10-CM | POA: Diagnosis not present

## 2015-04-08 DIAGNOSIS — F4310 Post-traumatic stress disorder, unspecified: Secondary | ICD-10-CM | POA: Diagnosis not present

## 2015-05-11 DIAGNOSIS — F3181 Bipolar II disorder: Secondary | ICD-10-CM | POA: Diagnosis not present

## 2015-05-11 DIAGNOSIS — F909 Attention-deficit hyperactivity disorder, unspecified type: Secondary | ICD-10-CM | POA: Diagnosis not present

## 2015-05-11 DIAGNOSIS — F410 Panic disorder [episodic paroxysmal anxiety] without agoraphobia: Secondary | ICD-10-CM | POA: Diagnosis not present

## 2015-05-11 DIAGNOSIS — F4310 Post-traumatic stress disorder, unspecified: Secondary | ICD-10-CM | POA: Diagnosis not present

## 2015-10-11 ENCOUNTER — Encounter (HOSPITAL_COMMUNITY): Payer: Self-pay
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
## 2015-10-20 ENCOUNTER — Ambulatory Visit (HOSPITAL_COMMUNITY): Payer: Commercial Managed Care - PPO | Admitting: Anesthesiology

## 2015-10-20 ENCOUNTER — Encounter (HOSPITAL_COMMUNITY): Payer: Self-pay

## 2015-10-20 ENCOUNTER — Encounter (HOSPITAL_COMMUNITY)
Admission: RE | Disposition: A | Discharge status: Home / Self Care | Payer: Self-pay | Source: Ambulatory Visit | Attending: Obstetrics & Gynecology

## 2015-10-20 ENCOUNTER — Ambulatory Visit (HOSPITAL_COMMUNITY)
Admission: RE | Admit: 2015-10-20 | Discharge: 2015-10-20 | Disposition: A | Discharge status: Home / Self Care | Payer: Commercial Managed Care - PPO | Source: Ambulatory Visit | Attending: Obstetrics & Gynecology | Admitting: Obstetrics & Gynecology

## 2015-10-20 DIAGNOSIS — Z30432 Encounter for removal of intrauterine contraceptive device: Secondary | ICD-10-CM | POA: Diagnosis present

## 2015-10-20 HISTORY — DX: Gastro-esophageal reflux disease without esophagitis: K21.9

## 2015-10-20 HISTORY — DX: Headache, unspecified: R51.9

## 2015-10-20 HISTORY — PX: IUD REMOVAL: SHX5392

## 2015-10-20 HISTORY — DX: Encounter for adoption services: Z02.82

## 2015-10-20 HISTORY — PX: HYSTEROSCOPY: SHX211

## 2015-10-20 HISTORY — DX: Headache: R51

## 2015-10-20 LAB — CBC
HEMATOCRIT: 44 % (ref 36.0–46.0)
HEMOGLOBIN: 15.2 g/dL — AB (ref 12.0–15.0)
MCH: 29.6 pg (ref 26.0–34.0)
MCHC: 34.5 g/dL (ref 30.0–36.0)
MCV: 85.8 fL (ref 78.0–100.0)
Platelets: 331 10*3/uL (ref 150–400)
RBC: 5.13 MIL/uL — AB (ref 3.87–5.11)
RDW: 13 % (ref 11.5–15.5)
WBC: 10.7 10*3/uL — AB (ref 4.0–10.5)

## 2015-10-20 LAB — PREGNANCY, URINE: Preg Test, Ur: NEGATIVE

## 2015-10-20 SURGERY — HYSTEROSCOPY
Anesthesia: General

## 2015-10-20 MED ORDER — FENTANYL CITRATE (PF) 100 MCG/2ML IJ SOLN
INTRAMUSCULAR | Status: DC | PRN
  Administered 2015-10-20: 100 ug via INTRAVENOUS

## 2015-10-20 MED ORDER — SODIUM CHLORIDE 0.9 % IR SOLN
Status: DC | PRN
  Administered 2015-10-20: 1000 mL

## 2015-10-20 MED ORDER — LIDOCAINE HCL (CARDIAC) 20 MG/ML IV SOLN
INTRAVENOUS | Status: AC
  Filled 2015-10-20: qty 5

## 2015-10-20 MED ORDER — FENTANYL CITRATE (PF) 100 MCG/2ML IJ SOLN
INTRAMUSCULAR | Status: AC
  Filled 2015-10-20: qty 2

## 2015-10-20 MED ORDER — MIDAZOLAM HCL 2 MG/2ML IJ SOLN
INTRAMUSCULAR | Status: DC | PRN
  Administered 2015-10-20: 2 mg via INTRAVENOUS

## 2015-10-20 MED ORDER — SILVER NITRATE-POT NITRATE 75-25 % EX MISC
CUTANEOUS | Status: AC
  Filled 2015-10-20: qty 1

## 2015-10-20 MED ORDER — PROPOFOL 10 MG/ML IV BOLUS
INTRAVENOUS | Status: AC
  Filled 2015-10-20: qty 20

## 2015-10-20 MED ORDER — DEXAMETHASONE SODIUM PHOSPHATE 4 MG/ML IJ SOLN
INTRAMUSCULAR | Status: AC
  Filled 2015-10-20: qty 1

## 2015-10-20 MED ORDER — LACTATED RINGERS IV SOLN: INTRAVENOUS | Status: DC

## 2015-10-20 MED ORDER — SCOPOLAMINE 1 MG/3DAYS TD PT72
MEDICATED_PATCH | TRANSDERMAL | Status: AC
  Administered 2015-10-20: 1.5 mg via TRANSDERMAL
  Filled 2015-10-20: qty 1

## 2015-10-20 MED ORDER — FENTANYL CITRATE (PF) 100 MCG/2ML IJ SOLN
25.0000 ug | INTRAMUSCULAR | Status: DC | PRN
  Administered 2015-10-20 (×3): 50 ug via INTRAVENOUS

## 2015-10-20 MED ORDER — LACTATED RINGERS IV SOLN
INTRAVENOUS | Status: DC
  Administered 2015-10-20 (×2): via INTRAVENOUS

## 2015-10-20 MED ORDER — PROMETHAZINE HCL 25 MG/ML IJ SOLN: 6.2500 mg | INTRAMUSCULAR | Status: DC | PRN

## 2015-10-20 MED ORDER — OXYCODONE-ACETAMINOPHEN 5-325 MG PO TABS
ORAL_TABLET | ORAL | Status: AC
  Filled 2015-10-20: qty 2

## 2015-10-20 MED ORDER — SCOPOLAMINE 1 MG/3DAYS TD PT72
1.0000 | MEDICATED_PATCH | Freq: Once | TRANSDERMAL | Status: DC
  Administered 2015-10-20: 1.5 mg via TRANSDERMAL

## 2015-10-20 MED ORDER — KETOROLAC TROMETHAMINE 30 MG/ML IJ SOLN
INTRAMUSCULAR | Status: AC
  Filled 2015-10-20: qty 1

## 2015-10-20 MED ORDER — SODIUM CHLORIDE 0.9 % IR SOLN: Status: DC | PRN

## 2015-10-20 MED ORDER — PROPOFOL 10 MG/ML IV BOLUS
INTRAVENOUS | Status: DC | PRN
  Administered 2015-10-20: 200 mg via INTRAVENOUS

## 2015-10-20 MED ORDER — OXYCODONE-ACETAMINOPHEN 5-325 MG PO TABS
2.0000 | ORAL_TABLET | Freq: Once | ORAL | Status: AC
  Administered 2015-10-20: 2 via ORAL

## 2015-10-20 MED ORDER — DEXAMETHASONE SODIUM PHOSPHATE 10 MG/ML IJ SOLN
INTRAMUSCULAR | Status: DC | PRN
  Administered 2015-10-20: 4 mg via INTRAVENOUS

## 2015-10-20 MED ORDER — MIDAZOLAM HCL 2 MG/2ML IJ SOLN
INTRAMUSCULAR | Status: AC
  Filled 2015-10-20: qty 2

## 2015-10-20 MED ORDER — ONDANSETRON HCL 4 MG/2ML IJ SOLN
INTRAMUSCULAR | Status: AC
  Filled 2015-10-20: qty 2

## 2015-10-20 MED ORDER — KETOROLAC TROMETHAMINE 30 MG/ML IJ SOLN
INTRAMUSCULAR | Status: DC | PRN
  Administered 2015-10-20: 30 mg via INTRAVENOUS

## 2015-10-20 SURGICAL SUPPLY — 17 items
CANISTER SUCT 3000ML (MISCELLANEOUS) ×3 IMPLANT
CATH ROBINSON RED A/P 16FR (CATHETERS) ×3 IMPLANT
CLOTH BEACON ORANGE TIMEOUT ST (SAFETY) ×3 IMPLANT
CONTAINER PREFILL 10% NBF 60ML (FORM) ×6 IMPLANT
DILATOR CANAL MILEX (MISCELLANEOUS) IMPLANT
GLOVE BIOGEL PI IND STRL 6.5 (GLOVE) ×2 IMPLANT
GLOVE BIOGEL PI IND STRL 7.0 (GLOVE) ×1 IMPLANT
GLOVE BIOGEL PI INDICATOR 6.5 (GLOVE) ×4
GLOVE BIOGEL PI INDICATOR 7.0 (GLOVE) ×2
GLOVE ECLIPSE 6.5 STRL STRAW (GLOVE) ×3 IMPLANT
GOWN STRL REUS W/TWL LRG LVL3 (GOWN DISPOSABLE) ×6 IMPLANT
PACK VAGINAL MINOR WOMEN LF (CUSTOM PROCEDURE TRAY) ×3 IMPLANT
PAD OB MATERNITY 4.3X12.25 (PERSONAL CARE ITEMS) ×3 IMPLANT
TOWEL OR 17X24 6PK STRL BLUE (TOWEL DISPOSABLE) ×6 IMPLANT
TUBING AQUILEX INFLOW (TUBING) ×3 IMPLANT
TUBING AQUILEX OUTFLOW (TUBING) ×3 IMPLANT
WATER STERILE IRR 1000ML POUR (IV SOLUTION) ×3 IMPLANT

## 2015-10-20 NOTE — Anesthesia Postprocedure Evaluation (Signed)
Anesthesia Post Note  Patient: Kelly Barry  Procedure(s) Performed: Procedure(s) (LRB): HYSTEROSCOPY (N/A) INTRAUTERINE DEVICE (IUD) REMOVAL (N/A)  Patient location during evaluation: PACU Anesthesia Type: General Level of consciousness: awake Pain management: pain level controlled Vital Signs Assessment: post-procedure vital signs reviewed and stable Respiratory status: spontaneous breathing Cardiovascular status: stable Postop Assessment: no signs of nausea or vomiting Anesthetic complications: no    Last Vitals:  Filed Vitals:   10/20/15 1200 10/20/15 1215  BP: 102/63 95/59  Pulse: 66 69  Temp:    Resp: 13 23    Last Pain:  Filed Vitals:   10/20/15 1221  PainSc: 2                  Arvella Massingale JR,JOHN Khylie Larmore

## 2015-10-20 NOTE — Op Note (Signed)
Operative Report  PreOp: Hysteroscopy, IUD removal PostOp: same Procedure:  Hysteroscopy, IUD removal Surgeon: Dr. Janyth Pupa Anesthesia: General Complications: none EBL: Minimal UOP: 25cc IVF:500cc Discrepancy: 45cc  Findings:Strings visualized by hysteroscopy, Normal proliferative endometrium, both ostia visualized  Specimens: none   Procedure: The patient was taken to the operating room where she underwent general anesthesia without difficulty. The patient was placed in a low lithotomy position using Allen stirrups. The patient was examined with the findings as noted above.  She was then prepped and draped in the normal sterile fashion. The bladder was drained using a red rubber urethral catheter. A sterile speculum was inserted into the vagina. A single tooth tenaculum was placed on the anterior lip of the cervix. The endocervical canal was then serially dilated to 14French using Hank dilators.  The diagnostic hysteroscope was then inserted without difficulty and noted to have the findings as listed above. NS was used for visualization.  Graspers were used to remove the device in its entirety.  The uterus was examined with the findings as noted above.  All instrument were then removed. Hemostasis was observed at the cervical site.  The patient was repositioned to the supine position. The patient tolerated the procedure without any complications and taken to recovery in stable condition.   Janyth Pupa, DO [phone] (pager) [phone] (office)

## 2015-10-20 NOTE — Interval H&P Note (Signed)
History and Physical Interval Note:  10/20/2015 10:52 AM  Kelly Barry  has presented today for surgery, with the diagnosis of Z30.431 Checking of IUD  The various methods of treatment have been discussed with the patient and family. After consideration of risks, benefits and other options for treatment, the patient has consented to  Procedure(s): HYSTEROSCOPY (N/A) INTRAUTERINE DEVICE (IUD) REMOVAL (N/A) as a surgical intervention .  The patient's history has been reviewed, patient examined, no change in status, stable for surgery.  I have reviewed the patient's chart and labs.  Questions were answered to the patient's satisfaction.     Janyth Pupa, M

## 2015-10-20 NOTE — Discharge Instructions (Addendum)
HOME INSTRUCTIONS  Please note any unusual or excessive bleeding, pain, swelling. Mild dizziness or drowsiness are normal for about 24 hours after surgery.   Shower when comfortable  Restrictions: No driving for 24 hours or while taking pain medications.  Activity:  Vaginal spotting/bleeding is expected but if you noting soaking through more than one pad in an hour or passage of large clots, please call the office.   Diet:  You may return to your regular diet.  Do not eat large meals.  Eat small frequent meals throughout the day.  Continue to drink a good amount of water at least 6-8 glasses of water per day, hydration is very important for the healing process.  Pain Management: Take Motrin and/or tylenol as needed for pain   Always take prescription pain medication with food, it may cause constipation, increase fluids and fiber and you may want to take an over-the-counter stool softener like Colace as needed up to 2x a day.    Alcohol -- Avoid for 24 hours and while taking pain medications.  Nausea: Take sips of ginger ale or soda  Fever -- Call physician if temperature over 101 degrees  Follow up:  If you do not already have a follow up appointment scheduled, please call the office at [phone].  If you experience fever (a temperature greater than 100.4), pain unrelieved by pain medication, shortness of breath, swelling of a single leg, or any other symptoms which are concerning to you please the office immediately.DISCHARGE INSTRUCTIONS: HYSTEROSCOPY / ENDOMETRIAL ABLATION The following instructions have been prepared to help you care for yourself upon your return home.  May Remove Scop patch on or before  May take Ibuprofen after  May take stool softner while taking narcotic pain medication to prevent constipation.  Drink plenty of water.  Personal hygiene:  Use sanitary pads for vaginal drainage, not tampons.  Shower the day after your procedure.  NO tub baths, pools or  Jacuzzis for 2-3 weeks.  Wipe front to back after using the bathroom.  Activity and limitations:  Do NOT drive or operate any equipment for 24 hours. The effects of anesthesia are still present and drowsiness may result.  Do NOT rest in bed all day.  Walking is encouraged.  Walk up and down stairs slowly.  You may resume your normal activity in one to two days or as indicated by your physician. Sexual activity: NO intercourse for at least 2 weeks after the procedure, or as indicated by your Doctor.  Diet: Eat a light meal as desired this evening. You may resume your usual diet tomorrow.  Return to Work: You may resume your work activities in one to two days or as indicated by Marine scientist.  What to expect after your surgery: Expect to have vaginal bleeding/discharge for 2-3 days and spotting for up to 10 days. It is not unusual to have soreness for up to 1-2 weeks. You may have a slight burning sensation when you urinate for the first day. Mild cramps may continue for a couple of days. You may have a regular period in 2-6 weeks.  Call your doctor for any of the following:  Excessive vaginal bleeding or clotting, saturating and changing one pad every hour.  Inability to urinate 6 hours after discharge from hospital.  Pain not relieved by pain medication.  Fever of 100.4 F or greater.  Unusual vaginal discharge or odor.  Return to office _________________Call for an appointment ___________________ Patients signature: ______________________ Nurses signature ________________________  [place] [facility] [phone]

## 2015-10-20 NOTE — Anesthesia Procedure Notes (Signed)
Procedure Name: LMA Insertion Date/Time: 10/20/2015 11:07 AM Performed by: Dameian Crisman, Sheron Nightingale Pre-anesthesia Checklist: Patient identified, Timeout performed, Emergency Drugs available, Suction available and Patient being monitored Patient Re-evaluated:Patient Re-evaluated prior to inductionOxygen Delivery Method: Circle system utilized Preoxygenation: Pre-oxygenation with 100% oxygen Intubation Type: IV induction and Inhalational induction LMA: LMA inserted LMA Size: 3.0 Number of attempts: 2 Placement Confirmation: positive ETCO2 and breath sounds checked- equal and bilateral Dental Injury: Teeth and Oropharynx as per pre-operative assessment

## 2015-10-20 NOTE — Transfer of Care (Signed)
Immediate Anesthesia Transfer of Care Note  Patient: Kelly Barry  Procedure(s) Performed: Procedure(s): HYSTEROSCOPY (N/A) INTRAUTERINE DEVICE (IUD) REMOVAL (N/A)  Patient Location: PACU  Anesthesia Type:General  Level of Consciousness: awake, alert  and oriented  Airway & Oxygen Therapy: Patient Spontanous Breathing and Patient connected to nasal cannula oxygen  Post-op Assessment: Report given to RN and Post -op Vital signs reviewed and stable  Post vital signs: Reviewed and stable  Last Vitals:  Filed Vitals:   10/20/15 0915  Pulse: 79  Temp: 36.6 C  Resp: 18    Complications: No apparent anesthesia complications

## 2015-10-20 NOTE — Anesthesia Preprocedure Evaluation (Addendum)
Anesthesia Evaluation  Patient identified by MRN, date of birth, ID band Patient awake    Reviewed: Allergy & Precautions, NPO status , Patient's Chart, lab work & pertinent test results  History of Anesthesia Complications Negative for: history of anesthetic complications  Airway Mallampati: II  TM Distance: >3 FB Neck ROM: Full    Dental  (+) Teeth Intact, Dental Advisory Given   Pulmonary Current Smoker,    Pulmonary exam normal breath sounds clear to auscultation       Cardiovascular Exercise Tolerance: Good (-) hypertensionnegative cardio ROS Normal cardiovascular exam Rhythm:Regular Rate:Normal     Neuro/Psych  Headaches, PSYCHIATRIC DISORDERS (Borderline personality disorder; PTSD) Anxiety Depression Bipolar Disorder    GI/Hepatic Neg liver ROS, GERD  Medicated,  Endo/Other  negative endocrine ROS  Renal/GU negative Renal ROS     Musculoskeletal negative musculoskeletal ROS (+)   Abdominal   Peds  Hematology negative hematology ROS (+)   Anesthesia Other Findings Day of surgery medications reviewed with the patient.  Reproductive/Obstetrics negative OB ROS                           Anesthesia Physical Anesthesia Plan  ASA: II  Anesthesia Plan: General   Post-op Pain Management:    Induction: Intravenous  Airway Management Planned: LMA  Additional Equipment:   Intra-op Plan:   Post-operative Plan: Extubation in OR  Informed Consent: I have reviewed the patients History and Physical, chart, labs and discussed the procedure including the risks, benefits and alternatives for the proposed anesthesia with the patient or authorized representative who has indicated his/her understanding and acceptance.   Dental advisory given  Plan Discussed with: CRNA  Anesthesia Plan Comments: (Risks/benefits of general anesthesia discussed with patient including risk of damage to teeth,  lips, gum, and tongue, nausea/vomiting, allergic reactions to medications, and the possibility of heart attack, stroke and death.  All patient questions answered.  Patient wishes to proceed.)        Anesthesia Quick Evaluation

## 2015-10-21 ENCOUNTER — Encounter (HOSPITAL_COMMUNITY): Payer: Self-pay | Admitting: Obstetrics & Gynecology

## 2016-03-27 ENCOUNTER — Encounter (HOSPITAL_COMMUNITY): Payer: Self-pay

## 2016-03-27 ENCOUNTER — Inpatient Hospital Stay (HOSPITAL_COMMUNITY)
Admission: RE | Admit: 2016-03-27 | Discharge: 2016-03-30 | DRG: 885 | Disposition: A | Discharge status: Home / Self Care | Payer: Commercial Managed Care - PPO | Attending: Psychiatry | Admitting: Psychiatry

## 2016-03-27 DIAGNOSIS — Z811 Family history of alcohol abuse and dependence: Secondary | ICD-10-CM | POA: Diagnosis not present

## 2016-03-27 DIAGNOSIS — Z3202 Encounter for pregnancy test, result negative: Secondary | ICD-10-CM | POA: Diagnosis present

## 2016-03-27 DIAGNOSIS — R45851 Suicidal ideations: Secondary | ICD-10-CM

## 2016-03-27 DIAGNOSIS — F4000 Agoraphobia, unspecified: Secondary | ICD-10-CM | POA: Diagnosis present

## 2016-03-27 DIAGNOSIS — Z915 Personal history of self-harm: Secondary | ICD-10-CM

## 2016-03-27 DIAGNOSIS — F603 Borderline personality disorder: Secondary | ICD-10-CM | POA: Diagnosis present

## 2016-03-27 DIAGNOSIS — K219 Gastro-esophageal reflux disease without esophagitis: Secondary | ICD-10-CM | POA: Diagnosis present

## 2016-03-27 DIAGNOSIS — G4700 Insomnia, unspecified: Secondary | ICD-10-CM | POA: Diagnosis present

## 2016-03-27 DIAGNOSIS — F4310 Post-traumatic stress disorder, unspecified: Secondary | ICD-10-CM | POA: Diagnosis present

## 2016-03-27 DIAGNOSIS — F17210 Nicotine dependence, cigarettes, uncomplicated: Secondary | ICD-10-CM | POA: Diagnosis present

## 2016-03-27 DIAGNOSIS — F332 Major depressive disorder, recurrent severe without psychotic features: Principal | ICD-10-CM | POA: Diagnosis present

## 2016-03-27 MED ORDER — MAGNESIUM HYDROXIDE 400 MG/5ML PO SUSP: 30.0000 mL | Freq: Every day | ORAL | Status: DC | PRN

## 2016-03-27 MED ORDER — TRAZODONE HCL 50 MG PO TABS
50.0000 mg | ORAL_TABLET | Freq: Every evening | ORAL | Status: DC | PRN
  Administered 2016-03-27 – 2016-03-29 (×3): 50 mg via ORAL
  Filled 2016-03-27 (×3): qty 1

## 2016-03-27 MED ORDER — ACETAMINOPHEN 325 MG PO TABS
650.0000 mg | ORAL_TABLET | Freq: Four times a day (QID) | ORAL | Status: DC | PRN
  Administered 2016-03-28 – 2016-03-29 (×2): 650 mg via ORAL
  Filled 2016-03-27 (×2): qty 2

## 2016-03-27 MED ORDER — NICOTINE 14 MG/24HR TD PT24
14.0000 mg | MEDICATED_PATCH | Freq: Every day | TRANSDERMAL | Status: DC
  Administered 2016-03-28 – 2016-03-30 (×3): 14 mg via TRANSDERMAL
  Filled 2016-03-27 (×5): qty 1

## 2016-03-27 MED ORDER — LORAZEPAM 1 MG PO TABS
1.0000 mg | ORAL_TABLET | Freq: Four times a day (QID) | ORAL | Status: DC | PRN
  Administered 2016-03-27: 1 mg via ORAL
  Filled 2016-03-27: qty 1

## 2016-03-27 MED ORDER — ALUM & MAG HYDROXIDE-SIMETH 200-200-20 MG/5ML PO SUSP: 30.0000 mL | ORAL | Status: DC | PRN
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
## 2016-03-28 DIAGNOSIS — F332 Major depressive disorder, recurrent severe without psychotic features: Principal | ICD-10-CM

## 2016-03-28 DIAGNOSIS — R45851 Suicidal ideations: Secondary | ICD-10-CM

## 2016-03-28 LAB — RAPID URINE DRUG SCREEN, HOSP PERFORMED
AMPHETAMINES: NOT DETECTED
BENZODIAZEPINES: POSITIVE — AB
Barbiturates: NOT DETECTED
COCAINE: NOT DETECTED
OPIATES: NOT DETECTED
Tetrahydrocannabinol: NOT DETECTED

## 2016-03-28 LAB — COMPREHENSIVE METABOLIC PANEL
ALT: 27 U/L (ref 14–54)
AST: 24 U/L (ref 15–41)
Albumin: 4.1 g/dL (ref 3.5–5.0)
Alkaline Phosphatase: 60 U/L (ref 38–126)
Anion gap: 9 (ref 5–15)
BUN: 9 mg/dL (ref 6–20)
CHLORIDE: 111 mmol/L (ref 101–111)
CO2: 19 mmol/L — AB (ref 22–32)
Calcium: 9.3 mg/dL (ref 8.9–10.3)
Creatinine, Ser: 0.74 mg/dL (ref 0.44–1.00)
Glucose, Bld: 105 mg/dL — ABNORMAL HIGH (ref 65–99)
POTASSIUM: 3.3 mmol/L — AB (ref 3.5–5.1)
SODIUM: 139 mmol/L (ref 135–145)
Total Bilirubin: 0.4 mg/dL (ref 0.3–1.2)
Total Protein: 7.2 g/dL (ref 6.5–8.1)

## 2016-03-28 LAB — TSH: TSH: 3.64 u[IU]/mL (ref 0.350–4.500)

## 2016-03-28 LAB — CBC
HCT: 42.8 % (ref 36.0–46.0)
Hemoglobin: 14.6 g/dL (ref 12.0–15.0)
MCH: 29 pg (ref 26.0–34.0)
MCHC: 34.1 g/dL (ref 30.0–36.0)
MCV: 84.9 fL (ref 78.0–100.0)
PLATELETS: 284 10*3/uL (ref 150–400)
RBC: 5.04 MIL/uL (ref 3.87–5.11)
RDW: 13.2 % (ref 11.5–15.5)
WBC: 9.7 10*3/uL (ref 4.0–10.5)

## 2016-03-28 LAB — PREGNANCY, URINE: PREG TEST UR: NEGATIVE

## 2016-03-28 MED ORDER — TOPIRAMATE 25 MG PO TABS
50.0000 mg | ORAL_TABLET | Freq: Every day | ORAL | Status: DC
  Administered 2016-03-28 – 2016-03-29 (×2): 50 mg via ORAL
  Filled 2016-03-28 (×4): qty 2

## 2016-03-28 MED ORDER — LOPERAMIDE HCL 2 MG PO CAPS
ORAL_CAPSULE | ORAL | Status: AC
  Filled 2016-03-28: qty 1

## 2016-03-28 MED ORDER — PRAZOSIN HCL 2 MG PO CAPS
2.0000 mg | ORAL_CAPSULE | Freq: Every day | ORAL | Status: DC
  Administered 2016-03-28 – 2016-03-29 (×2): 2 mg via ORAL
  Filled 2016-03-28: qty 1
  Filled 2016-03-28: qty 2
  Filled 2016-03-28 (×2): qty 1

## 2016-03-28 MED ORDER — CLONAZEPAM 0.5 MG PO TABS
0.5000 mg | ORAL_TABLET | Freq: Three times a day (TID) | ORAL | Status: DC | PRN
  Administered 2016-03-28 – 2016-03-30 (×4): 0.5 mg via ORAL
  Filled 2016-03-28 (×5): qty 1

## 2016-03-28 MED ORDER — ZIPRASIDONE HCL 60 MG PO CAPS
60.0000 mg | ORAL_CAPSULE | Freq: Every day | ORAL | Status: DC
  Administered 2016-03-28 – 2016-03-29 (×2): 60 mg via ORAL
  Filled 2016-03-28 (×4): qty 1

## 2016-03-28 NOTE — Tx Team (Signed)
Interdisciplinary Treatment Plan Update (Adult) Date: 03/28/2016   Date: 03/28/2016 12:13 PM  Progress in Treatment:  Attending groups: Yes  Participating in groups: Yes  Taking medication as prescribed: Yes  Tolerating medication: Yes  Family/Significant othe contact made: No, CSW attempting to make contact with husband Patient understands diagnosis: Yes AEB seeking help with depression Discussing patient identified problems/goals with staff: Yes  Medical problems stabilized or resolved: Yes  Denies suicidal/homicidal ideation: Yes  Patient has not harmed self or Others: Yes   New problem(s) identified: None identified at this time.   Discharge Plan or Barriers: Pt will return home and follow-up with outpatient providers  Additional comments:  Patient and CSW reviewed pt's identified goals and treatment plan. Patient verbalized understanding and agreed to treatment plan. CSW reviewed [facility] "Discharge Process and Patient Involvement" Form. Pt verbalized understanding of information provided and signed form.   Reason for Continuation of Hospitalization:  Anxiety Depression Medication stabilization Suicidal ideation  Estimated length of stay: 2-3 days  Review of initial/current patient goals per problem list:   1.  Goal(s): Patient will participate in aftercare plan  Met:  Yes  Target date: 3-5 days from date of admission   As evidenced by: Patient will participate within aftercare plan AEB aftercare provider and housing plan at discharge being identified.   03/28/16: Pt will return home and follow-up with outpatient providers  2.  Goal (s): Patient will exhibit decreased depressive symptoms and suicidal ideations.  Met:  Yes  Target date: 3-5 days from date of admission   As evidenced by: Patient will utilize self rating of depression at 3 or below and demonstrate decreased signs of depression or be deemed stable for discharge by MD.  03/28/16: Pt rates depression at 0/10;  denies SI and presents with appropriate affect and improved mood  3.  Goal(s): Patient will demonstrate decreased signs and symptoms of anxiety.  Met:  Progressing  Target date: 3-5 days from date of admission   As evidenced by: Patient will utilize self rating of anxiety at 3 or below and demonstrated decreased signs of anxiety, or be deemed stable for discharge by MD  03/28/16: Pt rates anxiety at 5/10  Attendees:  Patient:    Family:    Physician: Dr. Parke Poisson, MD  03/28/2016 12:13 PM  Nursing: Lars Pinks, RN Case manager  03/28/2016 12:13 PM  Clinical Social Worker Peri Maris, [place] 03/28/2016 12:13 PM  Other: Tilden Fossa, [place] 03/28/2016 12:13 PM  Clinical:  Marcella Dubs, RN 03/28/2016 12:13 PM  Other: , RN Charge Nurse 03/28/2016 12:13 PM  Other:     Peri Maris, [place] Work [phone]

## 2016-03-28 NOTE — Progress Notes (Signed)
Patient ID: Kelly Barry, female   DOB: 11-23-1990, 25 y.o.   MRN: [id number]  EKG completed, placed in pt physical chart. Pt tolerated well. [place], MD notified of results. 03/28/2016 7:11 PM

## 2016-03-28 NOTE — Progress Notes (Signed)
Recreation Therapy Notes  Date: 06.07.2017 Time: 9:30am Location: [address] Group Room   Group Topic: Stress Management  Goal Area(s) Addresses:  Patient will actively participate in stress management techniques presented during session.   Behavioral Response:Did not attend.   Laureen Ochs Dalvin Clipper, LRT/CTRS         Benn Tarver L 03/28/2016 11:50 AM
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
## 2016-03-28 NOTE — BHH Group Notes (Signed)
[facility] LCSW Aftercare Discharge Planning Group Note  03/28/2016 8:45 AM  Participation Quality: Alert, Appropriate and Oriented  Mood/Affect: Appropriate  Depression Rating: 0  Anxiety Rating: 5  Thoughts of Suicide: Pt denies SI/HI  Will you contract for safety? Yes  Current AVH: Pt denies  Plan for Discharge/Comments: Pt attended discharge planning group and actively participated in group. CSW discussed suicide prevention education with the group and encouraged them to discuss discharge planning and any relevant barriers. Pt reports that she is feeling well this morning and would like to speak with the doctor about staying on her current medications.  Transportation Means: Pt reports access to transportation  Supports: No supports mentioned at this time  Peri Maris, [place] 03/28/2016 9:40 AM

## 2016-03-28 NOTE — Progress Notes (Signed)
Patient ID: Kelly Barry, female   DOB: 31-Dec-1990, 25 y.o.   MRN: [id number]  Pt currently presents with a flat affect and anxious behavior. Per self inventory, pt rates depression at a 4, hopelessness 3 and anxiety 6. Pt's daily goal is to "to be happy" and they intend to do so by "talk to people and not be alone." Pt reports good sleep, a good appetite, normal energy and good concentration. Pt reports anxiety throughout the day and has a slight cough.   Pt provided with medications per providers orders. Pt's labs and vitals were monitored throughout the day. Pt supported emotionally and encouraged to express concerns and questions. Pt educated on medications. Pt signed a 72 hour today with CWS.   Pt's safety ensured with 15 minute and environmental checks. Pt currently denies SI/HI and A/V hallucinations. Pt verbally agrees to seek staff if SI/HI or A/VH occurs and to consult with staff before acting on any harmful thoughts. Will continue POC.

## 2016-03-28 NOTE — BHH Group Notes (Addendum)
[facility] LCSW Group Therapy Note  03/28/2016 2:45pm  Type of Therapy and Topic: Group Therapy: Holding onto [place]ud[place]   Pt did not attend as she was meeting with the doctor.  Peri Maris, [place] 03/28/2016 2:11 PM

## 2016-03-28 NOTE — [facility] Suicide Risk Assessment (Signed)
Kelly Barry INPATIENT:  Family/Significant Other Suicide Prevention Education  Suicide Prevention Education:  Education Completed; Kelly Barry, Pt's husband [phone],  (name of family member/significant other) has been identified by the patient as the family member/significant other with whom the patient will be residing, and identified as the person(s) who will aid the patient in the event of a mental health crisis (suicidal ideations/suicide attempt).  With written consent from the patient, the family member/significant other has been provided the following suicide prevention education, prior to the and/or following the discharge of the patient.  The suicide prevention education provided includes the following:  Suicide risk factors  Suicide prevention and interventions  National Suicide Hotline telephone number  [facility] assessment telephone number  [facility] Emergency Assistance [place] and/or Residential Mobile Crisis Unit telephone number  Request made of family/significant other to:  Remove weapons (e.g., guns, rifles, knives), all items previously/currently identified as safety concern.    Remove drugs/medications (over-the-counter, prescriptions, illicit drugs), all items previously/currently identified as a safety concern.  The family member/significant other verbalizes understanding of the suicide prevention education information provided.  The family member/significant other agrees to remove the items of safety concern listed above.  Kelly Barry 03/28/2016, 2:16 PM

## 2016-03-29 LAB — HIV ANTIBODY (ROUTINE TESTING W REFLEX): HIV SCREEN 4TH GENERATION: NONREACTIVE

## 2016-03-29 LAB — GC/CHLAMYDIA PROBE AMP (~~LOC~~) NOT AT ARMC
CHLAMYDIA, DNA PROBE: NEGATIVE
NEISSERIA GONORRHEA: NEGATIVE

## 2016-03-29 LAB — LIPID PANEL
Cholesterol: 211 mg/dL — ABNORMAL HIGH (ref 0–200)
HDL: 45 mg/dL (ref >40)
LDL CALC: 136 mg/dL — AB (ref 0–99)
Total CHOL/HDL Ratio: 4.7 RATIO
Triglycerides: 149 mg/dL (ref <150)
VLDL: 30 mg/dL (ref 0–40)

## 2016-03-29 LAB — RPR: RPR Ser Ql: NONREACTIVE

## 2016-03-29 MED ORDER — FLUTICASONE PROPIONATE 50 MCG/ACT NA SUSP
1.0000 | Freq: Two times a day (BID) | NASAL | Status: DC
  Administered 2016-03-29 – 2016-03-30 (×2): 1 via NASAL
  Filled 2016-03-29 (×2): qty 16

## 2016-03-29 NOTE — Clinical Social Work Note (Signed)
CSW spoke w patients current therapist, Ines Bloomer, re scheduling patient for therapy next week after discharge. Gave options for appointments.   Patients last appointment was at end of April 2017.  Has concerns about patient and her safety/judgment re personal safety.  Says patient is "volatile" and "has ups and downs", questions whether patient may higher level of care at discharge such as intensive outpatient for mental health, DBT group, ACT or CST services.   Edwyna Shell, LCSW Lead Clinical Social Worker Phone:  [phone]

## 2016-03-29 NOTE — Progress Notes (Signed)
[facility] MD Progress Note  03/29/2016 2:56 PM Kelly Barry  MRN:  [id number] Subjective:   Patient reports she is feeling " a lot better" , and at this time minimizes any symptoms of depression. States " I feel normal", and is focused on being discharged soon.  Denies medication side effects- states she feels her medications " work very well for me", and are well tolerated . At this time does not endorse nightmares or severe intrusive recollections about recent trauma- states " I am looking forward now, just want to be with my husband and family again". Objective : I have discussed case with treatment team and have met with patient. Patient presents euthymic, full range of affect, and currently denying significant neuro-vegetative symptoms of depression . Denies any suicidal ideations . Behavior on unit calm, pleasant on approach, visible on unit, and noted to be interacting appropriately with selected peers of about her age . Denies medication side effects- no akathisia, no abnormal involuntary movements noted . Labs reviewed as below- TSH WNL, Lipid panel remarkable for slightly elevated cholesterol, HIV , RPR negative, Pregnancy test negative  Principal Problem: Suicidal ideations Diagnosis:   Patient Active Problem List   Diagnosis Date Noted  . Suicidal ideations [R45.851] 03/28/2016  . MDD (major depressive disorder), recurrent episode, severe ([place]) [F33.2] 03/27/2016   Total Time spent with patient: 20 minutes    Past Medical History:  Past Medical History  Diagnosis Date  . PTSD (post-traumatic stress disorder)   . Bipolar disorder ([place])   . Depression   . Anxiety   . Borderline personality disorder   . Adopted   . GERD (gastroesophageal reflux disease)   . Headache     Migraines    Past Surgical History  Procedure Laterality Date  . Ganglion cyst excision    . Esophagogastroduodenoscopy  06/25/2012    Procedure: ESOPHAGOGASTRODUODENOSCOPY (EGD);  Surgeon: Arta Silence,  MD;  Location: [facility];  Service: Endoscopy;  Laterality: N/A;  [name]/ebp  . Hysteroscopy N/A 10/20/2015    Procedure: HYSTEROSCOPY;  Surgeon: Janyth Pupa, DO;  Location: [facility];  Service: Gynecology;  Laterality: N/A;  . Iud removal N/A 10/20/2015    Procedure: INTRAUTERINE DEVICE (IUD) REMOVAL;  Surgeon: Janyth Pupa, DO;  Location: [facility];  Service: Gynecology;  Laterality: N/A;   Family History:  Family History  Problem Relation Age of Onset  . Adopted: Yes    Social History:  History  Alcohol Use No     History  Drug Use No    Social History   Social History  . Marital Status: Married    Spouse Name: N/A  . Number of Children: N/A  . Years of Education: N/A   Social History Main Topics  . Smoking status: Light Tobacco Smoker -- 0.10 packs/day for .2 years    Types: Cigarettes  . Smokeless tobacco: Never Used  . Alcohol Use: No  . Drug Use: No  . Sexual Activity: Yes    Birth Control/ Protection: Implant   Other Topics Concern  . None   Social History Narrative   Additional Social History:    Pain Medications: None  Prescriptions: None  Over the Counter: None  History of alcohol / drug use?: No history of alcohol / drug abuse Longest period of sobriety (when/how long): None  Withdrawal Symptoms:  (denies)  Sleep: Good  Appetite:  Good  Current Medications: Current Facility-Administered Medications  Medication Dose Route Frequency Provider Last Rate Last Dose  . acetaminophen (  TYLENOL) tablet 650 mg  650 mg Oral Q6H PRN Niel Hummer, NP   650 mg at 03/28/16 2135  . alum & mag hydroxide-simeth (MAALOX/MYLANTA) 200-200-20 MG/5ML suspension 30 mL  30 mL Oral Q4H PRN Niel Hummer, NP      . clonazePAM Bobbye [place]) tablet 0.5 mg  0.5 mg Oral TID PRN Jenne Campus, MD   0.5 mg at 03/29/16 1208  . magnesium hydroxide (MILK OF MAGNESIA) suspension 30 mL  30 mL Oral Daily PRN Niel Hummer, NP      . nicotine (NICODERM CQ - dosed in mg/24 hours)  patch 14 mg  14 mg Transdermal Daily Jenne Campus, MD   14 mg at 03/29/16 0813  . prazosin (MINIPRESS) capsule 2 mg  2 mg Oral QHS Jenne Campus, MD   2 mg at 03/28/16 2135  . topiramate (TOPAMAX) tablet 50 mg  50 mg Oral QHS Jenne Campus, MD   50 mg at 03/28/16 2135  . traZODone (DESYREL) tablet 50 mg  50 mg Oral QHS PRN Niel Hummer, NP   50 mg at 03/28/16 2135  . ziprasidone (GEODON) capsule 60 mg  60 mg Oral QHS Jenne Campus, MD   60 mg at 03/28/16 2136    Lab Results:  Results for orders placed or performed during the hospital encounter of 03/27/16 (from the past 48 hour(s))  CBC     Status: None   Collection Time: 03/28/16  6:11 AM  Result Value Ref Range   WBC 9.7 4.0 - 10.5 K/uL   RBC 5.04 3.87 - 5.11 MIL/uL   Hemoglobin 14.6 12.0 - 15.0 g/dL   HCT 42.8 36.0 - 46.0 %   MCV 84.9 78.0 - 100.0 fL   MCH 29.0 26.0 - 34.0 pg   MCHC 34.1 30.0 - 36.0 g/dL   RDW 13.2 11.5 - 15.5 %   Platelets 284 150 - 400 K/uL    Comment: Performed at [facility]  Comprehensive metabolic panel     Status: Abnormal   Collection Time: 03/28/16  6:11 AM  Result Value Ref Range   Sodium 139 135 - 145 mmol/L   Potassium 3.3 (L) 3.5 - 5.1 mmol/L   Chloride 111 101 - 111 mmol/L   CO2 19 (L) 22 - 32 mmol/L   Glucose, Bld 105 (H) 65 - 99 mg/dL   BUN 9 6 - 20 mg/dL   Creatinine, Ser 0.74 0.44 - 1.00 mg/dL   Calcium 9.3 8.9 - 10.3 mg/dL   Total Protein 7.2 6.5 - 8.1 g/dL   Albumin 4.1 3.5 - 5.0 g/dL   AST 24 15 - 41 U/L   ALT 27 14 - 54 U/L   Alkaline Phosphatase 60 38 - 126 U/L   Total Bilirubin 0.4 0.3 - 1.2 mg/dL   GFR calc non Af Amer >60 >60 mL/min   GFR calc Af Amer >60 >60 mL/min    Comment: (NOTE) The eGFR has been calculated using the CKD EPI equation. This calculation has not been validated in all clinical situations. eGFR's persistently <60 mL/min signify possible Chronic Kidney Disease.    Anion gap 9 5 - 15    Comment: Performed at [facility]  TSH     Status: None   Collection Time: 03/28/16  6:11 AM  Result Value Ref Range   TSH 3.640 0.350 - 4.500 uIU/mL    Comment: Performed at [facility]  Pregnancy, urine     Status: None   Collection Time: 03/28/16  9:31 AM  Result Value Ref Range   Preg Test, Ur NEGATIVE NEGATIVE    Comment:        THE SENSITIVITY OF THIS METHODOLOGY IS >20 mIU/mL. Performed at [facility]   Urine rapid drug screen (hosp performed)not at [facility]     Status: Abnormal   Collection Time: 03/28/16  9:31 AM  Result Value Ref Range   Opiates NONE DETECTED NONE DETECTED   Cocaine NONE DETECTED NONE DETECTED   Benzodiazepines POSITIVE (A) NONE DETECTED   Amphetamines NONE DETECTED NONE DETECTED   Tetrahydrocannabinol NONE DETECTED NONE DETECTED   Barbiturates NONE DETECTED NONE DETECTED    Comment:        DRUG SCREEN FOR MEDICAL PURPOSES ONLY.  IF CONFIRMATION IS NEEDED FOR ANY PURPOSE, NOTIFY LAB WITHIN 5 DAYS.        LOWEST DETECTABLE LIMITS FOR URINE DRUG SCREEN Drug Class       Cutoff (ng/mL) Amphetamine      1000 Barbiturate      200 Benzodiazepine   175 Tricyclics       102 Opiates          300 Cocaine          300 THC              50 Performed at [facility]   Lipid panel     Status: Abnormal   Collection Time: 03/29/16  6:24 AM  Result Value Ref Range   Cholesterol 211 (H) 0 - 200 mg/dL   Triglycerides 149 <150 mg/dL   HDL 45 >40 mg/dL   Total CHOL/HDL Ratio 4.7 RATIO   VLDL 30 0 - 40 mg/dL   LDL Cholesterol 136 (H) 0 - 99 mg/dL    Comment:        Total Cholesterol/HDL:CHD Risk Coronary Heart Disease Risk Table                     Men   Women  1/2 Average Risk   3.4   3.3  Average Risk       5.0   4.4  2 X Average Risk   9.6   7.1  3 X Average Risk  23.4   11.0        Use the calculated Patient Ratio above and the CHD Risk Table to determine the patient's CHD Risk.        ATP III CLASSIFICATION  (LDL):  <100     mg/dL   Optimal  100-129  mg/dL   Near or Above                    Optimal  130-159  mg/dL   Borderline  160-189  mg/dL   High  >190     mg/dL   Very High Performed at [facility]   HIV antibody     Status: None   Collection Time: 03/29/16  6:24 AM  Result Value Ref Range   HIV Screen 4th Generation wRfx Non Reactive Non Reactive    Comment: (NOTE) Performed At: [facility] [address] [place], [place] [id number] Lindon Romp MD [id number] Performed at [facility]   RPR     Status: None   Collection Time: 03/29/16  6:24 AM  Result Value Ref Range   RPR Ser Ql Non Reactive Non Reactive  Comment: (NOTE) Performed At: [facility] [place], [place] [id number] Lindon Romp MD [id number] Performed at [facility]     Blood Alcohol level:  Lab Results  Component Value Date   [facility] <11 05/25/2013   ETH <11 06/09/2012    Physical Findings: AIMS: Facial and Oral Movements Muscles of Facial Expression: None, normal Lips and Perioral Area: None, normal Jaw: None, normal Tongue: None, normal,Extremity Movements Upper (arms, wrists, hands, fingers): None, normal Lower (legs, knees, ankles, toes): None, normal, Trunk Movements Neck, shoulders, hips: None, normal, Overall Severity Severity of abnormal movements (highest score from questions above): None, normal Incapacitation due to abnormal movements: None, normal Patient's awareness of abnormal movements (rate only patient's report): No Awareness, Dental Status Current problems with teeth and/or dentures?: No Does patient usually wear dentures?: No  CIWA:    COWS:     Musculoskeletal: Strength & Muscle Tone: within normal limits Gait & Station: normal Patient leans: N/A  Psychiatric Specialty Exam: Physical Exam  ROS no headache, no chest pain, no shortness of breath   Blood pressure 111/64, pulse 89,  temperature 98.3 F (36.8 C), temperature source Oral, resp. rate 18, height '4\' 11"'  (1.499 m), weight 150 lb (68.04 kg).Body mass index is 30.28 kg/(m^2).  General Appearance: Well Groomed  Eye Contact:  Good  Speech:  Normal Rate  Volume:  Normal  Mood:  reports feels well today, denies depression, and presents euthymic   Affect:  Appropriate and Full Range  Thought Process:  Linear  Orientation:  Full (Time, Place, and Person)  Thought Content:  denies hallucinations, no delusions, not internally preoccupied   Suicidal Thoughts:  No- at this time denies any suicidal ideations, denies any self injurious ideations , contracts for safety on the unit   Homicidal Thoughts:  No- denies any homicidal or violent ideations   Memory:  recent and remote grossly intact   Judgement:  Improving   Insight:  Fair  Psychomotor Activity:  Normal  Concentration:  Concentration: Good and Attention Span: Good  Recall:  Good  Fund of Knowledge:  Good  Language:  Good  Akathisia:  Negative  Handed:  Right  AIMS (if indicated):     Assets:  Resilience Social Support  ADL's:  Intact  Cognition:  WNL  Sleep:  Number of Hours: 5.75  Assessment - currently patient presents euthymic, with brighter , reactive affect, and minimizes depression or symptoms of Acute Stress Disorder at this time. No disruptive behaviors on unit. Denies medication side effects and states current medication regimen  have been effective for her  At this time denies any suicidal ideations. Patient is hoping for discharge soon, plans to return home.    Treatment Plan Summary: Daily contact with patient to assess and evaluate symptoms and progress in treatment, Medication management, Plan inpatient admission and medications as below Continue to encourage group and milieu participation to work on coping skills and symptom reduction Continue Geodon 60 mgrs QHS for mood disorder  Continue Minipress 2 mgrs QHS for PTSD related  nightmares and insomnia Continue Trazodone 50 mgrs QHS PRN for insomnia as needed  Continue Topamax 50 mgrs QHS for mood disorder and for headache prophylaxis Treatment team working on disposition planning options  Neita Garnet, MD 03/29/2016, 2:56 PM

## 2016-03-29 NOTE — Progress Notes (Signed)
Kelly Barry rates Anxiety 9/10 "because I'm going home tomorrow" and Depression 0/10. Her goal was to just make it through the day. Denies SI/HI/AVH. Encouragement and support given. Medications administered as prescribed. Continue Q 15 minute checks for patient safety and medication effectiveness.

## 2016-03-29 NOTE — Progress Notes (Addendum)
Adult Psychoeducational Group Note  Date:  03/29/2016 Time: 09:00am  Group Topic/Focus:  Crisis Planning:   The purpose of this group is to help patients create a crisis plan for use upon discharge or in the future, as needed.  Participation Level:  Active  Participation Quality:  Appropriate  Affect:  Blunted  Cognitive:  Appropriate  Insight: Lacking  Engagement in Group:  Engaged  Modes of Intervention:  Activity, Discussion, Orientation and Support  Additional Comments:  Pt rates energy level at a 3-4 out of ten. Reports that her discharge plan is to live with her fiance' and to hang out with friends.   Elenore Rota 03/29/2016, 9:50 AM

## 2016-03-29 NOTE — Progress Notes (Signed)
Patient did attend the evening karaoke group. Pt was engaged and supportive but did not participate by singing a song.    

## 2016-03-29 NOTE — Progress Notes (Signed)
Adult Psychoeducational Group Note  Date:  03/29/2016 Time:  3:11 AM  Group Topic/Focus:  Wrap-Up Group:   The focus of this group is to help patients review their daily goal of treatment and discuss progress on daily workbooks.  Participation Level:  Active  Participation Quality:  Appropriate  Affect:  Appropriate  Cognitive:  Alert  Insight: Appropriate  Engagement in Group:  Engaged  Modes of Intervention:  Discussion  Additional Comments:  Patient states, "I had a really good day". Patient goal for today was "to not hibernate in her room". Patient met her goal.  [name] L [name] 03/29/2016, 3:11 AM

## 2016-03-29 NOTE — [facility] Group Notes (Signed)
[place] LCSW Group Therapy  03/29/2016 3:35 PM  Type of Therapy:  Group Therapy  Participation Level:  Active  Participation Quality:  Appropriate and Attentive  Affect:  Appropriate  Cognitive:  Alert and Appropriate  Insight:  Developing/Improving  Engagement in Therapy:  Developing/Improving  Modes of Intervention:  Discussion, Education, Exploration and Support  Summary of Progress/Problems:  [facility] Speaker came to talk about his personal journey with substance abuse and addiction. The pt processed ways by which to relate to the speaker. [place] speaker provided handouts and educational information pertaining to groups and services offered by the [facility].   Beverely Pace 03/29/2016, 3:35 PM

## 2016-03-29 NOTE — Progress Notes (Signed)
Patient ID: Kelly Barry, female   DOB: [birth year], 25 y.o.   MRN: [id number]  Pt currently presents with a flat affect and anxious behavior. Per self inventory, pt rates depression, hopelessness and anxiety at a 0. Pt's daily goal is to "to get through the day" and they intend to do so by "to be out of my room." Upon further discussion, pt reports that making it through the day includes "not going off on people who are giving me the side eye." Pt reports good sleep, a fair appetite, normal energy and good concentration. Pt attends groups today, minimal insight.   Pt provided with medications per providers orders. Pt's labs and vitals were monitored throughout the day. Pt supported emotionally and encouraged to express concerns and questions. Pt educated on medications.  Pt's safety ensured with 15 minute and environmental checks. Pt currently denies SI/HI and A/V hallucinations. Pt verbally agrees to seek staff if SI/HI or A/VH occurs and to consult with staff before acting on any harmful thoughts. Will continue POC.

## 2016-03-29 NOTE — Progress Notes (Signed)
Adult Psychoeducational Group Note  Date:  03/29/2016 Time:  1015  Group Topic/Focus:  Self Esteem Action Plan:   The focus of this group is to help patients create a plan to continue to build self-esteem after discharge.  Participation Level:  Active  Participation Quality:  Appropriate and Attentive  Affect:  Appropriate  Cognitive:  Appropriate  Insight: Appropriate and Good  Engagement in Group:  Engaged and Improving  Modes of Intervention:  Discussion, Education and Rapport Building  Additional Comments:  Pt attended and participated in group discussion. Pt was able to list 5 things that she love to do and things she love about herself. Pt was challenged to list 20 things she loves about her self   Kelly Barry 03/29/2016, 11:26 AM

## 2016-03-29 NOTE — Progress Notes (Signed)
D   Pt is pleasant on approach this evening   She did not remember meeting this staff person last night   She said she was out of it    She is very childlike    She asked if the nausea she was having could mean she was pregnant    She interacts well with peers and is compliant with treatment A   Verbal support given   Explained symptoms of pregnacy probably would not be happening now but that she was having a test to see if she were pregnant or not and she could discuss it with her doctor    Medications administered and effectiveness monitored   Q 15 min cheks R   Pt is safe and verbalizes understanding

## 2016-03-30 LAB — HEMOGLOBIN A1C
Hgb A1c MFr Bld: 5.2 % (ref 4.8–5.6)
MEAN PLASMA GLUCOSE: 103 mg/dL

## 2016-03-30 LAB — PROLACTIN: Prolactin: 54.3 ng/mL — ABNORMAL HIGH (ref 4.8–23.3)

## 2016-03-30 MED ORDER — TRAZODONE HCL 50 MG PO TABS
50.0000 mg | ORAL_TABLET | Freq: Every evening | ORAL | Status: DC | PRN
Start: 2016-03-30 — End: 2016-11-25

## 2016-03-30 MED ORDER — SERTRALINE HCL 50 MG PO TABS: 50.0000 mg | ORAL_TABLET | Freq: Every day | ORAL | Status: DC

## 2016-03-30 MED ORDER — TOPIRAMATE 50 MG PO TABS: 50.0000 mg | ORAL_TABLET | Freq: Every day | ORAL | Status: DC

## 2016-03-30 NOTE — Tx Team (Signed)
Interdisciplinary Treatment Plan Update (Adult) Date: 03/30/2016   Date: 03/30/2016 10:48 AM  Progress in Treatment:  Attending groups: Yes  Participating in groups: Yes  Taking medication as prescribed: Yes  Tolerating medication: Yes  Family/Significant othe contact made: Yes with husband Patient understands diagnosis: Yes AEB seeking help with depression Discussing patient identified problems/goals with staff: Yes  Medical problems stabilized or resolved: Yes  Denies suicidal/homicidal ideation: Yes  Patient has not harmed self or Others: Yes   New problem(s) identified: None identified at this time.   Discharge Plan or Barriers: Pt will return home and follow-up with outpatient providers  Additional comments:  Patient and CSW reviewed pt's identified goals and treatment plan. Patient verbalized understanding and agreed to treatment plan. CSW reviewed [facility] "Discharge Process and Patient Involvement" Form. Pt verbalized understanding of information provided and signed form.   Reason for Continuation of Hospitalization:  Anxiety Depression Medication stabilization Suicidal ideation  Estimated length of stay: 0 days  Review of initial/current patient goals per problem list:   1.  Goal(s): Patient will participate in aftercare plan  Met:  Yes  Target date: 3-5 days from date of admission   As evidenced by: Patient will participate within aftercare plan AEB aftercare provider and housing plan at discharge being identified.   03/28/16: Pt will return home and follow-up with outpatient providers  2.  Goal (s): Patient will exhibit decreased depressive symptoms and suicidal ideations.  Met:  Yes  Target date: 3-5 days from date of admission   As evidenced by: Patient will utilize self rating of depression at 3 or below and demonstrate decreased signs of depression or be deemed stable for discharge by MD.  03/28/16: Pt rates depression at 0/10; denies SI and presents with  appropriate affect and improved mood  3.  Goal(s): Patient will demonstrate decreased signs and symptoms of anxiety.  Met:  Adequate for DC  Target date: 3-5 days from date of admission   As evidenced by: Patient will utilize self rating of anxiety at 3 or below and demonstrated decreased signs of anxiety, or be deemed stable for discharge by MD  03/28/16: Pt rates anxiety at 5/10 03/30/2016: MD feels that Pt's symptoms have decreased to the point that they can be managed in an outpatient setting.   Attendees:  Patient:    Family:    Physician: Dr. Parke Poisson, MD  03/30/2016 10:48 AM  Nursing: Lars Pinks, RN Case manager  03/30/2016 10:48 AM  Clinical Social Worker Peri Maris, [place] 03/30/2016 10:48 AM  Other: Tilden Fossa, [place] 03/30/2016 10:48 AM  Clinical:  Marcella Dubs, RN; Sandre Kitty, RN 03/30/2016 10:48 AM  Other: , RN Charge Nurse 03/30/2016 10:48 AM  Other:     Peri Maris, [place] Social Work [phone]
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
## 2016-03-30 NOTE — Progress Notes (Signed)
Kelly Barry is readied for DC as she completes her daily assessment and writes on it she denies SI today and she rates her depression, hopelessness and anxiety " 0/0/4", respectively". She is given the DC AVS, SRA and transition record. It is reviewed with her, she states understanding and willingness to comply and all belongings are returned to her per policy. DC complete.
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
## 2016-03-30 NOTE — Progress Notes (Signed)
  [facility] Adult Case Management Discharge Plan :  Will you be returning to the same living situation after discharge:  Yes,  Pt returning home with husband At discharge, do you have transportation home?: Yes,  Pt husband to pick up Do you have the ability to pay for your medications: Yes,  Pt provided with prescriptions  Release of information consent forms completed and in the chart;  Patient's signature needed at discharge.  Patient to Follow up at: Follow-up Information    Follow up with [facility] On 04/05/2016.   Why:  June 15 at 2:30pm with [facility] for medication management.    Contact information:   [place], [place] [address] Phone: [phone] Fax: [phone]      Follow up with [facility] On 04/04/2016.   Why:  at 6:00pm with Caren Griffins for therapy.   Contact information:   [place], [place] Phone: [phone]  Fax:  [phone]      Next level of care provider has access to [place] and Suicide Prevention discussed: Yes,  with husband; see SPE note  Have you used any form of tobacco in the last 30 days? (Cigarettes, Smokeless Tobacco, Cigars, and/or Pipes): Yes  Has patient been referred to the Quitline?: Patient refused referral  Patient has been referred for addiction treatment: N/A  Bo Mcclintock 03/30/2016, 10:49 AM

## 2016-06-10 ENCOUNTER — Encounter (HOSPITAL_COMMUNITY): Payer: Self-pay | Admitting: *Deleted

## 2016-06-10 ENCOUNTER — Inpatient Hospital Stay (HOSPITAL_COMMUNITY)
Admission: AD | Admit: 2016-06-10 | Discharge: 2016-06-10 | Disposition: A | Discharge status: Home / Self Care | Payer: Medicaid Other | Source: Ambulatory Visit | Attending: Obstetrics & Gynecology | Admitting: Obstetrics & Gynecology

## 2016-06-10 DIAGNOSIS — R51 Headache: Secondary | ICD-10-CM | POA: Insufficient documentation

## 2016-06-10 DIAGNOSIS — F603 Borderline personality disorder: Secondary | ICD-10-CM | POA: Insufficient documentation

## 2016-06-10 DIAGNOSIS — N643 Galactorrhea not associated with childbirth: Secondary | ICD-10-CM | POA: Diagnosis not present

## 2016-06-10 DIAGNOSIS — R45851 Suicidal ideations: Secondary | ICD-10-CM | POA: Insufficient documentation

## 2016-06-10 DIAGNOSIS — F17210 Nicotine dependence, cigarettes, uncomplicated: Secondary | ICD-10-CM | POA: Diagnosis not present

## 2016-06-10 DIAGNOSIS — F4310 Post-traumatic stress disorder, unspecified: Secondary | ICD-10-CM | POA: Diagnosis not present

## 2016-06-10 DIAGNOSIS — J45909 Unspecified asthma, uncomplicated: Secondary | ICD-10-CM | POA: Insufficient documentation

## 2016-06-10 DIAGNOSIS — Z3202 Encounter for pregnancy test, result negative: Secondary | ICD-10-CM

## 2016-06-10 DIAGNOSIS — K219 Gastro-esophageal reflux disease without esophagitis: Secondary | ICD-10-CM | POA: Diagnosis not present

## 2016-06-10 DIAGNOSIS — E229 Hyperfunction of pituitary gland, unspecified: Secondary | ICD-10-CM

## 2016-06-10 DIAGNOSIS — R109 Unspecified abdominal pain: Secondary | ICD-10-CM | POA: Diagnosis present

## 2016-06-10 DIAGNOSIS — M7989 Other specified soft tissue disorders: Secondary | ICD-10-CM | POA: Diagnosis not present

## 2016-06-10 DIAGNOSIS — F319 Bipolar disorder, unspecified: Secondary | ICD-10-CM | POA: Insufficient documentation

## 2016-06-10 DIAGNOSIS — N939 Abnormal uterine and vaginal bleeding, unspecified: Secondary | ICD-10-CM | POA: Diagnosis not present

## 2016-06-10 DIAGNOSIS — R7989 Other specified abnormal findings of blood chemistry: Secondary | ICD-10-CM

## 2016-06-10 HISTORY — DX: Suicide attempt, initial encounter: T14.91XA

## 2016-06-10 HISTORY — DX: Unspecified asthma, uncomplicated: J45.909

## 2016-06-10 LAB — URINALYSIS, ROUTINE W REFLEX MICROSCOPIC
BILIRUBIN URINE: NEGATIVE
GLUCOSE, UA: NEGATIVE mg/dL
Hgb urine dipstick: NEGATIVE
Ketones, ur: NEGATIVE mg/dL
LEUKOCYTES UA: NEGATIVE
NITRITE: NEGATIVE
PH: 6.5 (ref 5.0–8.0)
Protein, ur: NEGATIVE mg/dL
SPECIFIC GRAVITY, URINE: 1.025 (ref 1.005–1.030)

## 2016-06-10 LAB — HCG, SERUM, QUALITATIVE: PREG SERUM: NEGATIVE

## 2016-06-10 LAB — POCT PREGNANCY, URINE: Preg Test, Ur: NEGATIVE

## 2016-06-10 NOTE — Discharge Instructions (Signed)
Pregnancy Test Information WHAT IS A PREGNANCY TEST? A pregnancy test is used to detect the presence of human chorionic gonadotropin (hCG) in a sample of your urine or blood. hCG is a hormone produced by the cells of the placenta. The placenta is the organ that forms to nourish and support a developing baby. This test requires a sample of either blood or urine. A pregnancy test determines whether you are pregnant or not. HOW ARE PREGNANCY TESTS DONE? Pregnancy tests are done using a home pregnancy test or having a blood or urine test done at your health care provider's office.  Home pregnancy tests require a urine sample.  Most kits use a plastic testing device with a strip of paper that indicates whether there is hCG in your urine.  Follow the test instructions very carefully.  After you urinate on the test stick, markings will appear to let you know whether you are pregnant.  For best results, use your first urine of the morning. That is when the concentration of hCG is highest. Having a blood test to check for pregnancy requires a sample of blood drawn from a vein in your hand or arm. Your health care provider will send your sample to a lab for testing. Results of a pregnancy test will be positive or negative. IS ONE TYPE OF PREGNANCY TEST BETTER THAN ANOTHER? In some cases, a blood test will return a positive result even if a urine test was negative because blood tests are more sensitive. This means blood tests can detect hCG earlier than home pregnancy tests.  HOW ACCURATE ARE HOME PREGNANCY TESTS?  Both types of pregnancy tests are very accurate.  A blood test is about 98% accurate.  When you are far enough along in your pregnancy and when used correctly, home pregnancy tests are equally accurate. CAN ANYTHING INTERFERE WITH HOME PREGNANCY TEST RESULTS?  It is possible for certain conditions to cause an inaccurate test result (false positive or falsenegative).  A false positive is a  positive test result when you are not pregnant. This can happen if you:  Are taking certain medicines, including anticonvulsants or tranquilizers.  Have certain proteins in your blood.  A false negative is a negative test result when you are pregnant. This can happen if you:  Took the test before there was enough hCG to detect. A pregnancy test will not be positive in most women until 3-4 weeks after conception.  Drank a lot of liquid before the test. Diluted urine samples can sometimes give an inaccurate result.  Take certain medicines, such as water pills (diuretics) or some antihistamines. WHAT SHOULD I DO IF I HAVE A POSITIVE PREGNANCY TEST? If you have a positive pregnancy test, schedule an appointment with your health care provider. You might need additional testing to confirm the pregnancy. In the meantime, begin taking a prenatal vitamin, stop smoking, stop drinking alcohol, and do not use street drugs. Talk to your health care provider about how to take care of yourself during your pregnancy. Ask about what to expect from the care you will need throughout pregnancy (prenatal care).   This information is not intended to replace advice given to you by your health care provider. Make sure you discuss any questions you have with your health care provider.   Document Released: 10/11/2003 Document Revised: 10/29/2014 Document Reviewed: 02/02/2014 Elsevier Interactive Patient Education 2016 Elsevier Inc.  Prolactin Level Test WHY AM I HAVING THIS TEST? Prolactin is a hormone that is produced by  the pituitary gland. This test is often used to diagnose and monitor disease problems in the pituitary gland. Prolactin levels go up and down (fluctuate) due to stress, illness, trauma, or surgery. Increased levels are commonly associated with an absent menstrual cycle (amenorrhea) and tumors. WHAT KIND OF SAMPLE IS TAKEN? A blood sample is required for this test. It is usually collected by  inserting a needle into a vein. HOW DO I PREPARE FOR THE TEST? There is no preparation required for this test. WHAT ARE THE REFERENCE RANGES? Reference ranges are considered healthy ranges established after testing a large group of healthy people. Reference ranges may vary among different people, labs, and hospitals. It is your responsibility to obtain your test results. Ask the lab or department performing the test when and how you will get your results.  Adult female: less than 20 ng/mL.  Adult female: less than 28 ng/mL.  Pregnant female:  Trimester 1: less than 80 ng/mL.  Trimester 2: less than 160 ng/mL.  Trimester 3: less than 400 ng/mL. WHAT DO THE RESULTS MEAN? Increased levels of prolactin may indicate:  A pituitary gland tumor.  Amenorrhea.  Hypothyroidism.  Certain pituitary or reproductive syndromes.  Kidney failure. Decreased levels of prolactin may indicate:  Lack of blood to the pituitary gland.  Pituitary gland failure. Talk with your health care provider to discuss your results, treatment options, and if necessary, the need for more tests. Talk with your health care provider if you have any questions about your results.   This information is not intended to replace advice given to you by your health care provider. Make sure you discuss any questions you have with your health care provider.   Document Released: 11/10/2004 Document Revised: 10/29/2014 Document Reviewed: 04/02/2014 Elsevier Interactive Patient Education [employer].

## 2016-06-10 NOTE — MAU Note (Signed)
Pt had 5 pos HPT's about 4 weeks ago, states she started heavy vaginal bleeding today around 1215.  Has changed 6-7 tampons since then.  Also having lower & mid abdominal pain.
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
## 2016-07-11 ENCOUNTER — Encounter (HOSPITAL_COMMUNITY): Payer: Self-pay

## 2016-07-11 ENCOUNTER — Emergency Department (HOSPITAL_COMMUNITY)
Admission: EM | Admit: 2016-07-11 | Discharge: 2016-07-12 | Disposition: A | Discharge status: Home / Self Care | Payer: Medicaid Other | Attending: Emergency Medicine | Admitting: Emergency Medicine

## 2016-07-11 DIAGNOSIS — R1032 Left lower quadrant pain: Secondary | ICD-10-CM | POA: Insufficient documentation

## 2016-07-11 DIAGNOSIS — R1011 Right upper quadrant pain: Secondary | ICD-10-CM | POA: Diagnosis not present

## 2016-07-11 DIAGNOSIS — R1031 Right lower quadrant pain: Secondary | ICD-10-CM | POA: Insufficient documentation

## 2016-07-11 DIAGNOSIS — J45909 Unspecified asthma, uncomplicated: Secondary | ICD-10-CM | POA: Diagnosis not present

## 2016-07-11 DIAGNOSIS — M545 Low back pain, unspecified: Secondary | ICD-10-CM

## 2016-07-11 DIAGNOSIS — F17210 Nicotine dependence, cigarettes, uncomplicated: Secondary | ICD-10-CM | POA: Insufficient documentation

## 2016-07-11 LAB — COMPREHENSIVE METABOLIC PANEL
ALK PHOS: 77 U/L (ref 38–126)
ALT: 14 U/L (ref 14–54)
ANION GAP: 6 (ref 5–15)
AST: 19 U/L (ref 15–41)
Albumin: 3.9 g/dL (ref 3.5–5.0)
BILIRUBIN TOTAL: 0.4 mg/dL (ref 0.3–1.2)
BUN: 8 mg/dL (ref 6–20)
CALCIUM: 9.4 mg/dL (ref 8.9–10.3)
CO2: 21 mmol/L — ABNORMAL LOW (ref 22–32)
Chloride: 112 mmol/L — ABNORMAL HIGH (ref 101–111)
Creatinine, Ser: 0.79 mg/dL (ref 0.44–1.00)
Glucose, Bld: 99 mg/dL (ref 65–99)
POTASSIUM: 3.4 mmol/L — AB (ref 3.5–5.1)
Sodium: 139 mmol/L (ref 135–145)
TOTAL PROTEIN: 7.1 g/dL (ref 6.5–8.1)

## 2016-07-11 LAB — URINE MICROSCOPIC-ADD ON

## 2016-07-11 LAB — URINALYSIS, ROUTINE W REFLEX MICROSCOPIC
Bilirubin Urine: NEGATIVE
GLUCOSE, UA: NEGATIVE mg/dL
Hgb urine dipstick: NEGATIVE
KETONES UR: NEGATIVE mg/dL
NITRITE: NEGATIVE
PH: 5.5 (ref 5.0–8.0)
PROTEIN: NEGATIVE mg/dL
SPECIFIC GRAVITY, URINE: 1.027 (ref 1.005–1.030)

## 2016-07-11 LAB — CBC
HEMATOCRIT: 41.3 % (ref 36.0–46.0)
HEMOGLOBIN: 14.2 g/dL (ref 12.0–15.0)
MCH: 29.7 pg (ref 26.0–34.0)
MCHC: 34.4 g/dL (ref 30.0–36.0)
MCV: 86.4 fL (ref 78.0–100.0)
Platelets: 247 10*3/uL (ref 150–400)
RBC: 4.78 MIL/uL (ref 3.87–5.11)
RDW: 13.3 % (ref 11.5–15.5)
WBC: 8.8 10*3/uL (ref 4.0–10.5)

## 2016-07-11 LAB — I-STAT BETA HCG BLOOD, ED (MC, WL, AP ONLY): I-stat hCG, quantitative: <5 m[IU]/mL (ref <5)

## 2016-07-11 LAB — LIPASE, BLOOD: Lipase: 36 U/L (ref 11–51)

## 2016-07-11 MED ORDER — SODIUM CHLORIDE 0.9 % IV BOLUS (SEPSIS)
1000.0000 mL | Freq: Once | INTRAVENOUS | Status: AC
  Administered 2016-07-11: 1000 mL via INTRAVENOUS

## 2016-07-11 MED ORDER — KETOROLAC TROMETHAMINE 30 MG/ML IJ SOLN
30.0000 mg | Freq: Once | INTRAMUSCULAR | Status: AC
  Administered 2016-07-12: 30 mg via INTRAVENOUS
  Filled 2016-07-11: qty 1

## 2016-07-11 MED ORDER — ONDANSETRON 4 MG PO TBDP
4.0000 mg | ORAL_TABLET | Freq: Once | ORAL | Status: AC | PRN
  Administered 2016-07-11: 4 mg via ORAL

## 2016-07-11 MED ORDER — OXYCODONE-ACETAMINOPHEN 5-325 MG PO TABS: 1.0000 | ORAL_TABLET | ORAL | Status: DC | PRN

## 2016-07-11 MED ORDER — OXYCODONE-ACETAMINOPHEN 5-325 MG PO TABS
ORAL_TABLET | ORAL | Status: AC
  Administered 2016-07-11: 1 via ORAL
  Filled 2016-07-11: qty 1

## 2016-07-11 MED ORDER — ONDANSETRON 4 MG PO TBDP
ORAL_TABLET | ORAL | Status: AC
  Filled 2016-07-11: qty 1

## 2016-07-11 MED ORDER — OXYCODONE-ACETAMINOPHEN 5-325 MG PO TABS
1.0000 | ORAL_TABLET | ORAL | Status: AC | PRN
  Administered 2016-07-11 (×2): 1 via ORAL

## 2016-07-11 MED ORDER — OXYCODONE-ACETAMINOPHEN 5-325 MG PO TABS
ORAL_TABLET | ORAL | Status: AC
  Filled 2016-07-11: qty 1

## 2016-07-11 NOTE — ED Notes (Signed)
Pt stated she is in a lot of pain and would like to be reassessed for pain meds.  Illene Silver -RN  notified

## 2016-07-11 NOTE — ED Triage Notes (Signed)
Pt reports she has been feeling bad for a couple of weeks. Pt reports emesis and an episode of hematemesis today as well. Pt is alert and oriented. She also reports positive pregnancy tests at home as well.

## 2016-07-11 NOTE — ED Notes (Signed)
Pt requested to speak to a female RN

## 2016-07-11 NOTE — ED Provider Notes (Signed)
[facility] DEPT Provider Note   CSN: [id number] Arrival date & time: 07/11/16  1724     History   Chief Complaint Chief Complaint  Patient presents with  . Emesis  . Possible Pregnancy    HPI Oluwatomisin H Mamie Laurel is a 25 y.o. female.  HPI   Patient to the ER with PMH of bipolar disorder, borderline personality, PTSD, depressed, anxiety, hx of SI for bilateral low back pain and vomiting.  She has had the low back pain for a few months and says that it has been progressively worsening and then today it became so severe that she came to the ER to be seen.  Her pain is currently a 6/10 but it has been as high as a 10/10, sharp and jabbing, it hurts worse with certain movements. She has not had fever, no diarrhea, cough, no weakness, confusion, headache, SOB, no abnormal bleeding.     Past Medical History:  Diagnosis Date  . Adopted   . Anxiety   . Asthma   . Bipolar disorder ([place])   . Borderline personality disorder   . Depression   . GERD (gastroesophageal reflux disease)   . Headache    Migraines  . PTSD (post-traumatic stress disorder)   . Suicide attempt [facility])     Patient Active Problem List   Diagnosis Date Noted  . Suicidal ideations 03/28/2016  . MDD (major depressive disorder), recurrent episode, severe ([place]) 03/27/2016    Past Surgical History:  Procedure Laterality Date  . ESOPHAGOGASTRODUODENOSCOPY  06/25/2012   Procedure: ESOPHAGOGASTRODUODENOSCOPY (EGD);  Surgeon: Arta Silence, MD;  Location: [facility];  Service: Endoscopy;  Laterality: N/A;  [name]/ebp  . GANGLION CYST EXCISION    . HYSTEROSCOPY N/A 10/20/2015   Procedure: HYSTEROSCOPY;  Surgeon: Janyth Pupa, DO;  Location: [facility];  Service: Gynecology;  Laterality: N/A;  . IUD REMOVAL N/A 10/20/2015   Procedure: INTRAUTERINE DEVICE (IUD) REMOVAL;  Surgeon: Janyth Pupa, DO;  Location: [facility];  Service: Gynecology;  Laterality: N/A;    OB History    Gravida Para Term Preterm AB Living   1        1     SAB TAB Ectopic Multiple Live Births   1               Home Medications    Prior to Admission medications   Medication Sig Start Date End Date Taking? Authorizing Provider  clonazePAM (KLONOPIN) 0.5 MG tablet Take 0.5 mg by mouth 3 (three) times daily as needed for anxiety.    Yes Historical Provider, MD  prazosin (MINIPRESS) 2 MG capsule Take 4 mg by mouth at bedtime.    Yes Historical Provider, MD  sertraline (ZOLOFT) 100 MG tablet Take 100 mg by mouth daily.   Yes Historical Provider, MD  topiramate (TOPAMAX) 100 MG tablet Take 100 mg by mouth 2 (two) times daily.   Yes Historical Provider, MD  ziprasidone (GEODON) 60 MG capsule Take 60 mg by mouth 2 (two) times daily with a meal.    Yes Historical Provider, MD  cyclobenzaprine (FLEXERIL) 10 MG tablet Take 0.5-1 tablets (5-10 mg total) by mouth 2 (two) times daily as needed. 07/12/16   Kermitt Harjo Carlota Raspberry, PA-C  predniSONE (DELTASONE) 10 MG tablet Take 2 tablets (20 mg total) by mouth daily. 07/12/16   Allen Egerton Carlota Raspberry, PA-C  sertraline (ZOLOFT) 50 MG tablet Take 1 tablet (50 mg total) by mouth daily. Patient not taking: Reported on 07/12/2016 03/30/16   Nanci Pina, FNP  topiramate (TOPAMAX) 50 MG tablet Take 1 tablet (50 mg total) by mouth at bedtime. Patient not taking: Reported on 07/12/2016 03/30/16   Nanci Pina, FNP  traMADol (ULTRAM) 50 MG tablet Take 1 tablet (50 mg total) by mouth every 6 (six) hours as needed. 07/12/16   Shinita Mac Carlota Raspberry, PA-C  traZODone (DESYREL) 50 MG tablet Take 1 tablet (50 mg total) by mouth at bedtime as needed for sleep. Patient not taking: Reported on 07/12/2016 03/30/16   Nanci Pina, FNP    Family History Family History  Problem Relation Age of Onset  . Adopted: Yes    Social History Social History  Substance Use Topics  . Smoking status: Light Tobacco Smoker    Packs/day: 0.10    Years: 0.20    Types: Cigarettes  . Smokeless tobacco: Never Used  . Alcohol use No     Allergies     Hydroxyzine and Icy hot   Review of Systems Review of Systems Review of Systems All other systems negative except as documented in the HPI. All pertinent positives and negatives as reviewed in the HPI.   Physical Exam Updated Vital Signs BP 90/56 (BP Location: Left Arm)   Pulse 64   Temp 98.3 F (36.8 C) (Oral)   Resp 16   Ht 5\' 2"  (1.575 m)   Wt 68 kg   LMP  (LMP Unknown) Comment: - hcg  SpO2 99%   BMI 27.44 kg/m   Physical Exam  Constitutional: She appears well-developed and well-nourished.  HENT:  Head: Normocephalic and atraumatic.  Eyes: Conjunctivae are normal. Pupils are equal, round, and reactive to light.  Neck: Trachea normal, normal range of motion and full passive range of motion without pain. Neck supple.  Cardiovascular: Normal rate, regular rhythm and normal pulses.   Pulmonary/Chest: Effort normal and breath sounds normal. Chest wall is not dull to percussion. She exhibits no tenderness, no crepitus, no edema, no deformity and no retraction.  Abdominal: Soft. Normal appearance and bowel sounds are normal. She exhibits no ascites. There is tenderness in the right upper quadrant, right lower quadrant and left lower quadrant. There is guarding. There is no rigidity, no rebound, no CVA tenderness, no tenderness at McBurney's point and negative Murphy's sign.  Musculoskeletal: Normal range of motion.  Neurological: She is alert. She has normal strength.  Skin: Skin is warm, dry and intact.  Psychiatric: Her speech is normal and behavior is normal. Judgment and thought content normal. Her mood appears anxious. Cognition and memory are normal.     ED Treatments / Results  Labs (all labs ordered are listed, but only abnormal results are displayed) Labs Reviewed  COMPREHENSIVE METABOLIC PANEL - Abnormal; Notable for the following:       Result Value   Potassium 3.4 (*)    Chloride 112 (*)    CO2 21 (*)    All other components within normal limits  URINALYSIS,  ROUTINE W REFLEX MICROSCOPIC (NOT AT [facility]) - Abnormal; Notable for the following:    APPearance CLOUDY (*)    Leukocytes, UA SMALL (*)    All other components within normal limits  URINE MICROSCOPIC-ADD ON - Abnormal; Notable for the following:    Squamous Epithelial / LPF 6-30 (*)    Bacteria, UA RARE (*)    All other components within normal limits  LIPASE, BLOOD  CBC  I-STAT BETA HCG BLOOD, ED ([facility], [facility], [facility] ONLY)    EKG  EKG Interpretation None  Radiology Ct Abdomen Pelvis W Contrast  Result Date: 07/12/2016 CLINICAL DATA:  Feeling bad for couple of weeks. Vomiting with hematemesis today. EXAM: CT ABDOMEN AND PELVIS WITH CONTRAST TECHNIQUE: Multidetector CT imaging of the abdomen and pelvis was performed using the standard protocol following bolus administration of intravenous contrast. CONTRAST:  100 ml ISOVUE-300 IOPAMIDOL (ISOVUE-300) INJECTION 61% COMPARISON:  08/15/2012 FINDINGS: Lower chest: Calcified lymph nodes in the subcarinal region. Suggestion of mild esophageal wall thickening which could indicate reflux disease. Gas in the right ventricle probably results from intravenous injections. Hepatobiliary: No focal liver abnormality is seen. No gallstones, gallbladder wall thickening, or biliary dilatation. Pancreas: Unremarkable. No pancreatic ductal dilatation or surrounding inflammatory changes. Spleen: Normal in size without focal abnormality. Adrenals/Urinary Tract: Adrenal glands are unremarkable. Kidneys are normal, without renal calculi, focal lesion, or hydronephrosis. Bladder is unremarkable. Stomach/Bowel: Stomach is within normal limits. Appendix appears normal. No evidence of bowel wall thickening, distention, or inflammatory changes. Vascular/Lymphatic: No significant vascular findings are present. No enlarged abdominal or pelvic lymph nodes. Reproductive: Uterus and bilateral adnexa are unremarkable. Small amount of free fluid in the pelvis is likely physiologic.  Other: No abdominal wall hernia or abnormality. No abdominopelvic ascites. Musculoskeletal: No acute or significant osseous findings. IMPRESSION: No acute process demonstrated in the abdomen or pelvis. No evidence of bowel obstruction or inflammation. Electronically Signed   By: Lucienne Capers M.D.   On: 07/12/2016 02:20    Procedures Procedures (including critical care time)  Medications Ordered in ED Medications  oxyCODONE-acetaminophen (PERCOCET/ROXICET) 5-325 MG per tablet 1 tablet (not administered)  ketorolac (TORADOL) 30 MG/ML injection 30 mg (not administered)  ondansetron (ZOFRAN-ODT) disintegrating tablet 4 mg (4 mg Oral Given 07/11/16 1737)  oxyCODONE-acetaminophen (PERCOCET/ROXICET) 5-325 MG per tablet 1 tablet (1 tablet Oral Given 07/11/16 2226)  sodium chloride 0.9 % bolus 1,000 mL (0 mLs Intravenous Stopped 07/12/16 0111)  ketorolac (TORADOL) 30 MG/ML injection 30 mg (30 mg Intravenous Given 07/12/16 0016)  iopamidol (ISOVUE-300) 61 % injection (100 mLs  Contrast Given 07/12/16 0119)     Initial Impression / Assessment and Plan / ED Course  I have reviewed the triage vital signs and the nursing notes.  Pertinent labs & imaging results that were available during my care of the patient were reviewed by me and considered in my medical decision making (see chart for details).  Clinical Course    The patients CT scan is negative for any significant abnormalities. The patients symptoms seem more muscular, will treat as back pain and have her follow-up with her PCP.  25 y.o.Tessie H Bottoms's  with back pain.   No neurological deficits and normal neuro exam. No loss of bowel or bladder control. No concern for cauda equina at this time base on HPI and physical exam findings. No fever, night sweats, weight loss, h/o cancer, IVDU. The patient can walk with some discomfort.   Patient Plan 1. Medications: NSAIDs and/or muscle relaxer. Cont usual home medications unless otherwise  directed. 2. Treatment: rest, drink plenty of fluids, gentle stretching as discussed, alternate ice and heat  3. Follow Up: Please followup with your primary doctor for discussion of your diagnoses and further evaluation after today's visit; if you do not have a primary care doctor use the resource guide provided to find one  Advised to follow-up with the orthopedist if symptoms do not start to resolve in the next 2-3 days. If develop loss of bowel or urinary control return to the ED as soon as  possible for further evaluation. To take the medications as prescribed as they can cause harm if not taken appropriately.   Vital signs are stable at discharge. Vitals:   07/12/16 0030 07/12/16 0143  BP: 108/72 90/56  Pulse: 78 64  Resp:  16  Temp:      Patient/guardian has voiced understanding and agreed to follow-up with the PCP or specialist.      Final Clinical Impressions(s) / ED Diagnoses   Final diagnoses:  Bilateral low back pain without sciatica    New Prescriptions New Prescriptions   CYCLOBENZAPRINE (FLEXERIL) 10 MG TABLET    Take 0.5-1 tablets (5-10 mg total) by mouth 2 (two) times daily as needed.   PREDNISONE (DELTASONE) 10 MG TABLET    Take 2 tablets (20 mg total) by mouth daily.   TRAMADOL (ULTRAM) 50 MG TABLET    Take 1 tablet (50 mg total) by mouth every 6 (six) hours as needed.     Delos Haring, PA-C 07/12/16 0231    Orpah Greek, MD 07/12/16 [phone]

## 2016-07-12 ENCOUNTER — Emergency Department (HOSPITAL_COMMUNITY): Payer: Medicaid Other

## 2016-07-12 MED ORDER — CYCLOBENZAPRINE HCL 10 MG PO TABS: 5.0000 mg | ORAL_TABLET | Freq: Two times a day (BID) | ORAL | 0 refills | Status: DC | PRN

## 2016-07-12 MED ORDER — KETOROLAC TROMETHAMINE 30 MG/ML IJ SOLN: 30.0000 mg | Freq: Once | INTRAMUSCULAR | Status: DC

## 2016-07-12 MED ORDER — TRAMADOL HCL 50 MG PO TABS: 50.0000 mg | ORAL_TABLET | Freq: Four times a day (QID) | ORAL | 0 refills | Status: DC | PRN

## 2016-07-12 MED ORDER — PREDNISONE 10 MG PO TABS: 20.0000 mg | ORAL_TABLET | Freq: Every day | ORAL | 0 refills | Status: DC

## 2016-07-12 MED ORDER — IOPAMIDOL (ISOVUE-300) INJECTION 61%
INTRAVENOUS | Status: AC
  Administered 2016-07-12: 100 mL
  Filled 2016-07-12: qty 100

## 2016-07-12 NOTE — ED Notes (Signed)
Pt to radiology via stretcher.  

## 2016-07-12 NOTE — ED Notes (Signed)
Pt ambulatory to the restroom at this time without difficulty and a steady gait noted.

## 2016-07-26 ENCOUNTER — Encounter (HOSPITAL_COMMUNITY): Payer: Self-pay

## 2016-07-26 ENCOUNTER — Emergency Department (HOSPITAL_COMMUNITY)
Admission: EM | Admit: 2016-07-26 | Discharge: 2016-07-27 | Disposition: A | Discharge status: Home / Self Care | Payer: Worker's Compensation | Attending: Emergency Medicine | Admitting: Emergency Medicine

## 2016-07-26 ENCOUNTER — Emergency Department (HOSPITAL_COMMUNITY): Payer: Worker's Compensation

## 2016-07-26 DIAGNOSIS — W01198A Fall on same level from slipping, tripping and stumbling with subsequent striking against other object, initial encounter: Secondary | ICD-10-CM | POA: Insufficient documentation

## 2016-07-26 DIAGNOSIS — Y92009 Unspecified place in unspecified non-institutional (private) residence as the place of occurrence of the external cause: Secondary | ICD-10-CM | POA: Insufficient documentation

## 2016-07-26 DIAGNOSIS — J45909 Unspecified asthma, uncomplicated: Secondary | ICD-10-CM | POA: Diagnosis not present

## 2016-07-26 DIAGNOSIS — Y999 Unspecified external cause status: Secondary | ICD-10-CM | POA: Diagnosis not present

## 2016-07-26 DIAGNOSIS — Y939 Activity, unspecified: Secondary | ICD-10-CM | POA: Diagnosis not present

## 2016-07-26 DIAGNOSIS — S0083XA Contusion of other part of head, initial encounter: Secondary | ICD-10-CM | POA: Insufficient documentation

## 2016-07-26 DIAGNOSIS — Z79899 Other long term (current) drug therapy: Secondary | ICD-10-CM | POA: Diagnosis not present

## 2016-07-26 DIAGNOSIS — S199XXA Unspecified injury of neck, initial encounter: Secondary | ICD-10-CM | POA: Diagnosis not present

## 2016-07-26 DIAGNOSIS — S069X1A Unspecified intracranial injury with loss of consciousness of 30 minutes or less, initial encounter: Secondary | ICD-10-CM | POA: Diagnosis present

## 2016-07-26 DIAGNOSIS — F17210 Nicotine dependence, cigarettes, uncomplicated: Secondary | ICD-10-CM | POA: Insufficient documentation

## 2016-07-26 DIAGNOSIS — S0990XA Unspecified injury of head, initial encounter: Secondary | ICD-10-CM

## 2016-07-26 LAB — I-STAT BETA HCG BLOOD, ED (MC, WL, AP ONLY)

## 2016-07-26 LAB — BASIC METABOLIC PANEL
ANION GAP: 6 (ref 5–15)
BUN: 13 mg/dL (ref 6–20)
CALCIUM: 9.7 mg/dL (ref 8.9–10.3)
CO2: 21 mmol/L — ABNORMAL LOW (ref 22–32)
CREATININE: 0.72 mg/dL (ref 0.44–1.00)
Chloride: 108 mmol/L (ref 101–111)
GFR calc Af Amer: >60 mL/min (ref >60)
GLUCOSE: 111 mg/dL — AB (ref 65–99)
Potassium: 3.5 mmol/L (ref 3.5–5.1)
Sodium: 135 mmol/L (ref 135–145)

## 2016-07-26 LAB — CBC
HCT: 42.1 % (ref 36.0–46.0)
HEMOGLOBIN: 15.2 g/dL — AB (ref 12.0–15.0)
MCH: 30.1 pg (ref 26.0–34.0)
MCHC: 36.1 g/dL — ABNORMAL HIGH (ref 30.0–36.0)
MCV: 83.4 fL (ref 78.0–100.0)
PLATELETS: 273 10*3/uL (ref 150–400)
RBC: 5.05 MIL/uL (ref 3.87–5.11)
RDW: 13.1 % (ref 11.5–15.5)
WBC: 13.1 10*3/uL — AB (ref 4.0–10.5)

## 2016-07-26 LAB — URINALYSIS, ROUTINE W REFLEX MICROSCOPIC
Bilirubin Urine: NEGATIVE
Glucose, UA: NEGATIVE mg/dL
HGB URINE DIPSTICK: NEGATIVE
Ketones, ur: NEGATIVE mg/dL
Leukocytes, UA: NEGATIVE
NITRITE: NEGATIVE
PROTEIN: NEGATIVE mg/dL
SPECIFIC GRAVITY, URINE: 1.016 (ref 1.005–1.030)
pH: 5.5 (ref 5.0–8.0)

## 2016-07-26 LAB — CBG MONITORING, ED: Glucose-Capillary: 113 mg/dL — ABNORMAL HIGH (ref 65–99)

## 2016-07-26 MED ORDER — OXYCODONE-ACETAMINOPHEN 5-325 MG PO TABS
1.0000 | ORAL_TABLET | Freq: Once | ORAL | Status: AC
  Administered 2016-07-26: 1 via ORAL
  Filled 2016-07-26: qty 1

## 2016-07-26 NOTE — ED Notes (Signed)
Bed: [id number] Expected date:  Expected time:  Means of arrival:  Comments: 25 yr old syncopal episode
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
## 2016-07-27 NOTE — Discharge Instructions (Signed)
Use ice on the sore areas 3 or 4 times a day for 3 days. After that, use heat  Take ibuprofen, 3 times a day for pain

## 2016-09-23 ENCOUNTER — Encounter (HOSPITAL_COMMUNITY): Payer: Self-pay | Admitting: Nurse Practitioner

## 2016-09-23 ENCOUNTER — Emergency Department (HOSPITAL_COMMUNITY)
Admission: EM | Admit: 2016-09-23 | Discharge: 2016-09-23 | Disposition: A | Discharge status: Home / Self Care | Payer: Medicaid Other | Attending: Emergency Medicine | Admitting: Emergency Medicine

## 2016-09-23 DIAGNOSIS — N644 Mastodynia: Secondary | ICD-10-CM | POA: Insufficient documentation

## 2016-09-23 DIAGNOSIS — J45909 Unspecified asthma, uncomplicated: Secondary | ICD-10-CM | POA: Insufficient documentation

## 2016-09-23 DIAGNOSIS — Z79899 Other long term (current) drug therapy: Secondary | ICD-10-CM | POA: Insufficient documentation

## 2016-09-23 DIAGNOSIS — F17210 Nicotine dependence, cigarettes, uncomplicated: Secondary | ICD-10-CM | POA: Diagnosis not present

## 2016-09-23 LAB — PREGNANCY, URINE: PREG TEST UR: NEGATIVE

## 2016-09-23 MED ORDER — ACETAMINOPHEN 325 MG PO TABS
650.0000 mg | ORAL_TABLET | Freq: Once | ORAL | Status: AC
  Administered 2016-09-23: 650 mg via ORAL
  Filled 2016-09-23: qty 2

## 2016-09-23 MED ORDER — IBUPROFEN 200 MG PO TABS
600.0000 mg | ORAL_TABLET | Freq: Once | ORAL | Status: AC
  Administered 2016-09-23: 600 mg via ORAL
  Filled 2016-09-23: qty 3

## 2016-09-23 NOTE — ED Notes (Signed)
At bedside with provider chaperoning for breast exam. Pt reports drainage from both white discharge.

## 2016-09-23 NOTE — ED Triage Notes (Signed)
Pt states she has been experiencing body aches for the last week but worsened tonight. Also remarks on having pregnancy symptoms of breast tenderness, back pain and extreme nausea. States she had a positive pregnancy test yesterday but has had false positives in the recent past.

## 2016-09-23 NOTE — ED Provider Notes (Signed)
[facility] DEPT Provider Note   CSN: [id number] Arrival date & time: 09/23/16  0036  History   Chief Complaint Chief Complaint  Patient presents with  . Generalized Body Aches  . Nausea    HPI Kelly Barry is a 25 y.o. female.  Patient presenting with one week history of bilateral breast tenderness, lower back pain, and nausea without vomiting. Patient had positive urine pregnancy test day prior to presentation but states that she's had positive results before with the above associated symptoms with f/u serum tests being negative. Patient was told it was due to one of her medications though she is unsure of which one. She and her boyfriend are actively trying to get pregnant and not using contraception; LMP was Oct 30th.   She also was diagnosed with bronchitis about 1 month ago, and is still having a lingering cough with coughing episodes and some post-nasal drip.      Past Medical History:  Diagnosis Date  . Adopted   . Anxiety   . Asthma   . Bipolar disorder ([place])   . Borderline personality disorder   . Depression   . GERD (gastroesophageal reflux disease)   . Headache    Migraines  . PTSD (post-traumatic stress disorder)   . Suicide attempt     Patient Active Problem List   Diagnosis Date Noted  . Suicidal ideations 03/28/2016  . MDD (major depressive disorder), recurrent episode, severe ([place]) 03/27/2016    Past Surgical History:  Procedure Laterality Date  . ESOPHAGOGASTRODUODENOSCOPY  06/25/2012   Procedure: ESOPHAGOGASTRODUODENOSCOPY (EGD);  Surgeon: Arta Silence, MD;  Location: [facility];  Service: Endoscopy;  Laterality: N/A;  [name]/ebp  . GANGLION CYST EXCISION    . HYSTEROSCOPY N/A 10/20/2015   Procedure: HYSTEROSCOPY;  Surgeon: Janyth Pupa, DO;  Location: [facility];  Service: Gynecology;  Laterality: N/A;  . IUD REMOVAL N/A 10/20/2015   Procedure: INTRAUTERINE DEVICE (IUD) REMOVAL;  Surgeon: Janyth Pupa, DO;  Location: [facility];  Service:  Gynecology;  Laterality: N/A;   OB History    Gravida Para Term Preterm AB Living   1       1     SAB TAB Ectopic Multiple Live Births   1             Home Medications    Prior to Admission medications   Medication Sig Start Date End Date Taking? Authorizing Provider  albuterol (PROVENTIL HFA;VENTOLIN HFA) 108 (90 Base) MCG/ACT inhaler Inhale 1-2 puffs into the lungs every 6 (six) hours as needed for wheezing or shortness of breath.    Historical Provider, MD  clonazePAM (KLONOPIN) 0.5 MG tablet Take 0.5 mg by mouth 3 (three) times daily as needed for anxiety.     Historical Provider, MD  cyclobenzaprine (FLEXERIL) 10 MG tablet Take 0.5-1 tablets (5-10 mg total) by mouth 2 (two) times daily as needed. Patient not taking: Reported on 07/26/2016 07/12/16   Delos Haring, PA-C  prazosin (MINIPRESS) 2 MG capsule Take 4 mg by mouth at bedtime.     Historical Provider, MD  predniSONE (DELTASONE) 10 MG tablet Take 2 tablets (20 mg total) by mouth daily. Patient not taking: Reported on 07/26/2016 07/12/16   Delos Haring, PA-C  sertraline (ZOLOFT) 100 MG tablet Take 100 mg by mouth daily.    Historical Provider, MD  sertraline (ZOLOFT) 50 MG tablet Take 1 tablet (50 mg total) by mouth daily. Patient not taking: Reported on 07/12/2016 03/30/16   Nanci Pina, FNP  topiramate (TOPAMAX) 100 MG tablet Take 100 mg by mouth 2 (two) times daily.    Historical Provider, MD  topiramate (TOPAMAX) 50 MG tablet Take 1 tablet (50 mg total) by mouth at bedtime. Patient not taking: Reported on 07/12/2016 03/30/16   Nanci Pina, FNP  traMADol (ULTRAM) 50 MG tablet Take 1 tablet (50 mg total) by mouth every 6 (six) hours as needed. Patient not taking: Reported on 07/26/2016 07/12/16   Delos Haring, PA-C  traZODone (DESYREL) 50 MG tablet Take 1 tablet (50 mg total) by mouth at bedtime as needed for sleep. Patient not taking: Reported on 07/12/2016 03/30/16   Nanci Pina, FNP  ziprasidone (GEODON) 60 MG capsule  Take 60 mg by mouth 2 (two) times daily with a meal.     Historical Provider, MD   Family History Family History  Problem Relation Age of Onset  . Adopted: Yes   Social History Social History  Substance Use Topics  . Smoking status: Light Tobacco Smoker    Packs/day: 0.10    Years: 0.20    Types: Cigarettes  . Smokeless tobacco: Never Used  . Alcohol use No   Allergies   Hydroxyzine and Icy hot  Review of Systems Review of Systems  Constitutional: Positive for fever (subjective). Negative for activity change, appetite change and chills.  HENT: Positive for postnasal drip. Negative for congestion, rhinorrhea and sore throat.   Eyes: Negative.   Respiratory: Positive for cough. Negative for chest tightness, shortness of breath and wheezing.   Gastrointestinal: Positive for nausea. Negative for abdominal pain, blood in stool, constipation, diarrhea and vomiting.  Endocrine: Negative.   Genitourinary: Negative for decreased urine volume, difficulty urinating, dysuria, flank pain, frequency and hematuria.  Musculoskeletal: Positive for back pain.  Skin: Negative.   Neurological: Negative for dizziness, weakness, numbness and headaches.  Hematological: Negative.   Psychiatric/Behavioral: Negative.     Physical Exam Updated Vital Signs BP 97/62 (BP Location: Left Arm)   Pulse 78   Temp 98.1 F (36.7 C) (Oral)   Resp 19   Ht 4\' 11"  (1.499 m)   Wt 68 kg   LMP 08/20/2016   SpO2 98%   BMI 30.30 kg/m   Physical Exam  Constitutional: She is oriented to person, place, and time. She appears well-developed and well-nourished. No distress.  HENT:  Head: Normocephalic and atraumatic.  Eyes: Conjunctivae and EOM are normal.  Neck: Normal range of motion.  Cardiovascular: Normal rate, regular rhythm and normal heart sounds.  Exam reveals no gallop and no friction rub.   No murmur heard. Pulmonary/Chest: Effort normal and breath sounds normal. No respiratory distress. She has no  wheezes. She has no rales. She exhibits no mass, no tenderness and no edema. Right breast exhibits nipple discharge (white) and tenderness. Right breast exhibits no mass and no skin change. Left breast exhibits nipple discharge (white) and tenderness. Left breast exhibits no mass and no skin change. Breasts are symmetrical. There is no breast swelling.  Abdominal: Soft. Bowel sounds are normal. She exhibits no distension. There is no tenderness. There is no guarding.  Genitourinary: No breast bleeding.  Musculoskeletal: She exhibits no edema or tenderness.  Neurological: She is alert and oriented to person, place, and time.  Skin: Skin is warm and dry. No rash noted. She is not diaphoretic. No erythema.  Psychiatric: She has a normal mood and affect. Her behavior is normal. Judgment and thought content normal.    ED Treatments / Results  Labs (all labs ordered are listed, but only abnormal results are displayed) Labs Reviewed  PREGNANCY, URINE   EKG  EKG Interpretation None      Radiology No results found.  Procedures Procedures (including critical care time)  Medications Ordered in ED Medications  acetaminophen (TYLENOL) tablet 650 mg (650 mg Oral Given 09/23/16 0209)  ibuprofen (ADVIL,MOTRIN) tablet 600 mg (600 mg Oral Given 09/23/16 0208)    Initial Impression / Assessment and Plan / ED Course  I have reviewed the triage vital signs and the nursing notes.  Pertinent labs & imaging results that were available during my care of the patient were reviewed by me and considered in my medical decision making (see chart for details).  Clinical Course    Urine pregnancy test was negative. Symptoms consistent with premenstrual symptoms. Patient was instructed to follow up with her OB/Gyn as she had planned to do this week for further discussion and management of her medications. Patient had improvement in her symptoms with ibuprofen and acetaminophen administration. Patient had no  further questions or concerns at this time and was agreeable to discharge. We discussed strict return precautions if her symptoms were to worsen.  Final Clinical Impressions(s) / ED Diagnoses   Final diagnoses:  Breast tenderness in female    New Prescriptions Discharge Medication List as of 09/23/2016  2:53 AM      Alphonzo Grieve, MD IMTS - PGY1    Alphonzo Grieve, MD 09/23/16 [place], MD 09/23/16 0532

## 2016-09-23 NOTE — Discharge Instructions (Signed)
Your pregnancy test was negative. Your symptoms could be pre-menstrual. Please follow up with your OB/Gyn to discuss your medications and further management of your symptoms.

## 2016-10-28 ENCOUNTER — Encounter (HOSPITAL_COMMUNITY): Payer: Self-pay

## 2016-10-28 ENCOUNTER — Emergency Department (HOSPITAL_COMMUNITY)
Admission: EM | Admit: 2016-10-28 | Discharge: 2016-10-28 | Disposition: A | Discharge status: Home / Self Care | Payer: Medicaid Other | Attending: Emergency Medicine | Admitting: Emergency Medicine

## 2016-10-28 DIAGNOSIS — F17210 Nicotine dependence, cigarettes, uncomplicated: Secondary | ICD-10-CM | POA: Diagnosis not present

## 2016-10-28 DIAGNOSIS — J45909 Unspecified asthma, uncomplicated: Secondary | ICD-10-CM | POA: Diagnosis not present

## 2016-10-28 DIAGNOSIS — R509 Fever, unspecified: Secondary | ICD-10-CM | POA: Diagnosis present

## 2016-10-28 DIAGNOSIS — J069 Acute upper respiratory infection, unspecified: Secondary | ICD-10-CM | POA: Insufficient documentation

## 2016-10-28 LAB — RAPID STREP SCREEN (MED CTR MEBANE ONLY): STREPTOCOCCUS, GROUP A SCREEN (DIRECT): NEGATIVE

## 2016-10-28 NOTE — ED Notes (Signed)
Per lab, strep negative.

## 2016-10-28 NOTE — ED Triage Notes (Signed)
Pt here with sore throat and fever x 2-3 days.  Pt fever up to 102.  Has been medicated today

## 2016-10-28 NOTE — ED Notes (Signed)
Pt states she is ready to go because she "has been waiting too long and has to go to work". Pt verbalized understanding of leaving against medical advice and stated "I am fine" when asked about pain level.

## 2016-10-28 NOTE — ED Provider Notes (Signed)
[name] DEPT Provider Note   CSN: [id number] Arrival date & time: 10/28/16  1131     History   Chief Complaint Chief Complaint  Patient presents with  . Sore Throat  . Fever    HPI Kelly Barry is a 26 y.o. female.  Patient presents with complaint of sore throat, fever to 102F, nasal congestion, sinus pressure, body aches, mild nonproductive cough for the past 4 days. Positive sick contacts who had upper respiratory infection and sinus infection. She has been taking over-the-counter medications which temporarily helped fever. No vomiting or diarrhea. No urinary symptoms. Onset of symptoms acute. Course is constant. Nothing makes symptoms worse.      Past Medical History:  Diagnosis Date  . Adopted   . Anxiety   . Asthma   . Bipolar disorder ([place])   . Borderline personality disorder   . Depression   . GERD (gastroesophageal reflux disease)   . Headache    Migraines  . PTSD (post-traumatic stress disorder)   . Suicide attempt     Patient Active Problem List   Diagnosis Date Noted  . Suicidal ideations 03/28/2016  . MDD (major depressive disorder), recurrent episode, severe ([place]) 03/27/2016    Past Surgical History:  Procedure Laterality Date  . ESOPHAGOGASTRODUODENOSCOPY  06/25/2012   Procedure: ESOPHAGOGASTRODUODENOSCOPY (EGD);  Surgeon: Arta Silence, MD;  Location: [facility];  Service: Endoscopy;  Laterality: N/A;  [name]/ebp  . GANGLION CYST EXCISION    . HYSTEROSCOPY N/A 10/20/2015   Procedure: HYSTEROSCOPY;  Surgeon: Janyth Pupa, DO;  Location: [facility];  Service: Gynecology;  Laterality: N/A;  . IUD REMOVAL N/A 10/20/2015   Procedure: INTRAUTERINE DEVICE (IUD) REMOVAL;  Surgeon: Janyth Pupa, DO;  Location: [facility];  Service: Gynecology;  Laterality: N/A;    OB History    Gravida Para Term Preterm AB Living   1       1     SAB TAB Ectopic Multiple Live Births   1               Home Medications    Prior to Admission medications     Medication Sig Start Date End Date Taking? Authorizing Provider  albuterol (PROVENTIL HFA;VENTOLIN HFA) 108 (90 Base) MCG/ACT inhaler Inhale 1-2 puffs into the lungs every 6 (six) hours as needed for wheezing or shortness of breath.    Historical Provider, MD  clonazePAM (KLONOPIN) 0.5 MG tablet Take 0.5 mg by mouth 3 (three) times daily as needed for anxiety.     Historical Provider, MD  cyclobenzaprine (FLEXERIL) 10 MG tablet Take 0.5-1 tablets (5-10 mg total) by mouth 2 (two) times daily as needed. Patient not taking: Reported on 07/26/2016 07/12/16   Delos Haring, PA-C  prazosin (MINIPRESS) 2 MG capsule Take 4 mg by mouth at bedtime.     Historical Provider, MD  predniSONE (DELTASONE) 10 MG tablet Take 2 tablets (20 mg total) by mouth daily. Patient not taking: Reported on 07/26/2016 07/12/16   Delos Haring, PA-C  sertraline (ZOLOFT) 100 MG tablet Take 100 mg by mouth daily.    Historical Provider, MD  sertraline (ZOLOFT) 50 MG tablet Take 1 tablet (50 mg total) by mouth daily. Patient not taking: Reported on 07/12/2016 03/30/16   Nanci Pina, FNP  topiramate (TOPAMAX) 100 MG tablet Take 100 mg by mouth 2 (two) times daily.    Historical Provider, MD  topiramate (TOPAMAX) 50 MG tablet Take 1 tablet (50 mg total) by mouth at bedtime. Patient not taking:  Reported on 07/12/2016 03/30/16   Nanci Pina, FNP  traMADol (ULTRAM) 50 MG tablet Take 1 tablet (50 mg total) by mouth every 6 (six) hours as needed. Patient not taking: Reported on 07/26/2016 07/12/16   Delos Haring, PA-C  traZODone (DESYREL) 50 MG tablet Take 1 tablet (50 mg total) by mouth at bedtime as needed for sleep. Patient not taking: Reported on 07/12/2016 03/30/16   Nanci Pina, FNP  ziprasidone (GEODON) 60 MG capsule Take 60 mg by mouth 2 (two) times daily with a meal.     Historical Provider, MD    Family History Family History  Problem Relation Age of Onset  . Adopted: Yes    Social History Social History  Substance  Use Topics  . Smoking status: Light Tobacco Smoker    Packs/day: 0.10    Years: 0.20    Types: Cigarettes  . Smokeless tobacco: Never Used  . Alcohol use No     Allergies   Hydroxyzine and Icy hot   Review of Systems Review of Systems  Constitutional: Positive for fatigue and fever. Negative for chills.  HENT: Positive for congestion, rhinorrhea, sinus pressure and sore throat. Negative for ear pain.   Eyes: Negative for redness.  Respiratory: Positive for cough. Negative for wheezing.   Gastrointestinal: Positive for nausea. Negative for abdominal pain, diarrhea and vomiting.  Genitourinary: Negative for dysuria.  Musculoskeletal: Positive for myalgias. Negative for neck stiffness.  Skin: Negative for rash.  Neurological: Negative for headaches.  Hematological: Negative for adenopathy.     Physical Exam Updated Vital Signs BP 113/73 (BP Location: Right Arm)   Pulse 97   Temp 99 F (37.2 C) (Oral)   Resp 20   LMP  (LMP Unknown)   SpO2 99%   Physical Exam  Constitutional: She appears well-developed and well-nourished.  HENT:  Head: Normocephalic and atraumatic.  Right Ear: Tympanic membrane, external ear and ear canal normal.  Left Ear: Tympanic membrane, external ear and ear canal normal.  Nose: Nose normal. No mucosal edema.  Mouth/Throat: No oral lesions. Posterior oropharyngeal erythema present. No oropharyngeal exudate, posterior oropharyngeal edema or tonsillar abscesses.  Eyes: Conjunctivae are normal. Right eye exhibits no discharge. Left eye exhibits no discharge.  Neck: Normal range of motion. Neck supple.  Cardiovascular: Normal rate, regular rhythm and normal heart sounds.   Pulmonary/Chest: Effort normal and breath sounds normal.  Abdominal: Soft. There is no tenderness.  Neurological: She is alert.  Skin: Skin is warm and dry.  Psychiatric: She has a normal mood and affect.  Nursing note and vitals reviewed.    ED Treatments / Results   Labs (all labs ordered are listed, but only abnormal results are displayed) Labs Reviewed  RAPID STREP SCREEN (NOT AT [facility])  CULTURE, GROUP A STREP [facility])   Procedures Procedures (including critical care time)  Medications Ordered in ED Medications - No data to display   Initial Impression / Assessment and Plan / ED Course  I have reviewed the triage vital signs and the nursing notes.  Pertinent labs & imaging results that were available during my care of the patient were reviewed by me and considered in my medical decision making (see chart for details).  Clinical Course    Patient seen and examined. Strep test sent. If negative, will continue to treat conservatively.  Vital signs reviewed and are as follows: BP 113/73 (BP Location: Right Arm)   Pulse 97   Temp 99 F (37.2 C) (Oral)  Resp 20   LMP  (LMP Unknown)   SpO2 99%   Patient counseled on supportive care for viral URI and s/s to return including worsening symptoms, persistent fever, persistent vomiting, or if they have any other concerns. Urged to see PCP if symptoms persist for more than 3 days. Patient verbalizes understanding and agrees with plan.   5:56 PM Patient left per nursing prior to strep test results. This was negative. Pt not informed of results.    Final Clinical Impressions(s) / ED Diagnoses   Final diagnoses:  Upper respiratory tract infection, unspecified type   Patient with symptoms consistent with a viral syndrome. Strep neg. Vitals are stable, no fever. No signs of dehydration. Lung exam normal, no signs of pneumonia. Supportive therapy indicated with return if symptoms worsen.      New Prescriptions Discharge Medication List as of 10/28/2016  5:30 PM       Carlisle Cater, PA-C 10/28/16 [place], MD 10/30/16 2337

## 2016-10-31 LAB — CULTURE, GROUP A STREP (THRC)

## 2016-11-25 ENCOUNTER — Encounter (HOSPITAL_COMMUNITY): Payer: Self-pay | Admitting: Emergency Medicine

## 2016-11-25 ENCOUNTER — Ambulatory Visit (HOSPITAL_COMMUNITY)
Admission: EM | Admit: 2016-11-25 | Discharge: 2016-11-25 | Disposition: A | Discharge status: Home / Self Care | Payer: BLUE CROSS/BLUE SHIELD | Attending: Family Medicine | Admitting: Family Medicine

## 2016-11-25 DIAGNOSIS — B9789 Other viral agents as the cause of diseases classified elsewhere: Secondary | ICD-10-CM

## 2016-11-25 DIAGNOSIS — J069 Acute upper respiratory infection, unspecified: Secondary | ICD-10-CM

## 2016-11-25 DIAGNOSIS — J029 Acute pharyngitis, unspecified: Secondary | ICD-10-CM | POA: Diagnosis not present

## 2016-11-25 LAB — POCT RAPID STREP A: Streptococcus, Group A Screen (Direct): NEGATIVE

## 2016-11-25 MED ORDER — BENZONATATE 100 MG PO CAPS: 100.0000 mg | ORAL_CAPSULE | Freq: Three times a day (TID) | ORAL | 0 refills | Status: DC | PRN

## 2016-11-25 MED ORDER — OSELTAMIVIR PHOSPHATE 75 MG PO CAPS: 75.0000 mg | ORAL_CAPSULE | Freq: Two times a day (BID) | ORAL | 0 refills | Status: DC

## 2016-11-25 NOTE — ED Triage Notes (Signed)
The patient presented to the [facility] with a complaint of a cough and sore throat x 2 days. The patient denied a fever at home > 100.4

## 2016-11-25 NOTE — ED Provider Notes (Signed)
[facility]    CSN: [id number] Arrival date & time: 11/25/16  1231     History   Chief Complaint Chief Complaint  Patient presents with  . Sore Throat  . Cough    HPI Kelly Barry is a 26 y.o. female.   Patient has had sore throat, cough, and congestion for two days.  She had a fever to 101 last night.  Patient took Tylenol last night and has not had a fever since.  Some nausea, although no vomiting, diarrhea or abdominal pain.  Her roommates have also been sick.  She did not receive flu shot this year.  She has a h/o asthma and has an inhaler (Proventil)      Past Medical History:  Diagnosis Date  . Adopted   . Anxiety   . Asthma   . Bipolar disorder ([place])   . Borderline personality disorder   . Depression   . GERD (gastroesophageal reflux disease)   . Headache    Migraines  . PTSD (post-traumatic stress disorder)   . Suicide attempt     Patient Active Problem List   Diagnosis Date Noted  . Suicidal ideations 03/28/2016  . MDD (major depressive disorder), recurrent episode, severe ([place]) 03/27/2016    Past Surgical History:  Procedure Laterality Date  . ESOPHAGOGASTRODUODENOSCOPY  06/25/2012   Procedure: ESOPHAGOGASTRODUODENOSCOPY (EGD);  Surgeon: Arta Silence, MD;  Location: [facility];  Service: Endoscopy;  Laterality: N/A;  [name]/ebp  . GANGLION CYST EXCISION    . HYSTEROSCOPY N/A 10/20/2015   Procedure: HYSTEROSCOPY;  Surgeon: Janyth Pupa, DO;  Location: [facility];  Service: Gynecology;  Laterality: N/A;  . IUD REMOVAL N/A 10/20/2015   Procedure: INTRAUTERINE DEVICE (IUD) REMOVAL;  Surgeon: Janyth Pupa, DO;  Location: [facility];  Service: Gynecology;  Laterality: N/A;    OB History    Gravida Para Term Preterm AB Living   1       1     SAB TAB Ectopic Multiple Live Births   1               Home Medications    Prior to Admission medications   Medication Sig Start Date End Date Taking? Authorizing Provider  cyclobenzaprine  (FLEXERIL) 10 MG tablet Take 0.5-1 tablets (5-10 mg total) by mouth 2 (two) times daily as needed. 07/12/16  Yes Tiffany Carlota Raspberry, PA-C  ziprasidone (GEODON) 60 MG capsule Take 60 mg by mouth 2 (two) times daily with a meal.    Yes Historical Provider, MD  benzonatate (TESSALON) 100 MG capsule Take 1-2 capsules (100-200 mg total) by mouth 3 (three) times daily as needed for cough. 11/25/16   Robyn Haber, MD  clonazePAM (KLONOPIN) 0.5 MG tablet Take 0.5 mg by mouth 3 (three) times daily as needed for anxiety.     Historical Provider, MD  oseltamivir (TAMIFLU) 75 MG capsule Take 1 capsule (75 mg total) by mouth every 12 (twelve) hours. 11/25/16   Robyn Haber, MD    Family History Family History  Problem Relation Age of Onset  . Adopted: Yes    Social History Social History  Substance Use Topics  . Smoking status: Light Tobacco Smoker    Packs/day: 0.10    Years: 0.20    Types: Cigarettes  . Smokeless tobacco: Never Used  . Alcohol use No     Allergies   Hydroxyzine and Icy hot   Review of Systems Review of Systems  Constitutional: Positive for fever.  HENT: Positive for  congestion and sore throat.   Respiratory: Positive for cough.   Gastrointestinal: Positive for nausea. Negative for abdominal pain, diarrhea and vomiting.  Musculoskeletal: Negative.   Neurological: Negative.      Physical Exam Triage Vital Signs ED Triage Vitals  Enc Vitals Group     BP 11/25/16 1300 101/60     Pulse Rate 11/25/16 1300 81     Resp 11/25/16 1300 18     Temp 11/25/16 1300 98.5 F (36.9 C)     Temp Source 11/25/16 1300 Oral     SpO2 11/25/16 1300 99 %     Weight --      Height --      Head Circumference --      Peak Flow --      Pain Score 11/25/16 1259 5     Pain Loc --      Pain Edu? --      Excl. in [place]? --    No data found.   Updated Vital Signs BP 101/60 (BP Location: Right Arm)   Pulse 81   Temp 98.5 F (36.9 C) (Oral)   Resp 18   LMP  (LMP Unknown)   SpO2 99%      Physical Exam  Constitutional: She is oriented to person, place, and time. She appears well-developed and well-nourished.  HENT:  Right Ear: External ear normal.  Left Ear: External ear normal.  Posterior pharyngeal erythema, no exudates  Eyes: Conjunctivae and EOM are normal.  Neck: Normal range of motion. Neck supple.  Pulmonary/Chest: Effort normal and breath sounds normal.  Musculoskeletal: Normal range of motion.  Lymphadenopathy:    She has cervical adenopathy.  Neurological: She is alert and oriented to person, place, and time.  Skin: Skin is warm and dry.  Nursing note and vitals reviewed.    UC Treatments / Results  Labs (all labs ordered are listed, but only abnormal results are displayed) Labs Reviewed  POCT RAPID STREP A    EKG  EKG Interpretation None       Radiology No results found.  Procedures Procedures (including critical care time)  Medications Ordered in UC Medications - No data to display   Initial Impression / Assessment and Plan / UC Course  I have reviewed the triage vital signs and the nursing notes.  Pertinent labs & imaging results that were available during my care of the patient were reviewed by me and considered in my medical decision making (see chart for details).     Final Clinical Impressions(s) / UC Diagnoses   Final diagnoses:  Viral upper respiratory tract infection    New Prescriptions New Prescriptions   BENZONATATE (TESSALON) 100 MG CAPSULE    Take 1-2 capsules (100-200 mg total) by mouth 3 (three) times daily as needed for cough.   OSELTAMIVIR (TAMIFLU) 75 MG CAPSULE    Take 1 capsule (75 mg total) by mouth every 12 (twelve) hours.     Robyn Haber, MD 11/25/16 [phone]

## 2016-11-28 LAB — CULTURE, GROUP A STREP (THRC)

## 2017-01-07 ENCOUNTER — Inpatient Hospital Stay (HOSPITAL_COMMUNITY)
Admission: AD | Admit: 2017-01-07 | Discharge: 2017-01-08 | Disposition: A | Discharge status: Home / Self Care | Payer: BLUE CROSS/BLUE SHIELD | Source: Ambulatory Visit | Attending: Obstetrics and Gynecology | Admitting: Obstetrics and Gynecology

## 2017-01-07 DIAGNOSIS — R102 Pelvic and perineal pain: Secondary | ICD-10-CM | POA: Diagnosis present

## 2017-01-07 DIAGNOSIS — F17210 Nicotine dependence, cigarettes, uncomplicated: Secondary | ICD-10-CM | POA: Insufficient documentation

## 2017-01-07 DIAGNOSIS — Z3202 Encounter for pregnancy test, result negative: Secondary | ICD-10-CM | POA: Insufficient documentation

## 2017-01-07 DIAGNOSIS — Z5321 Procedure and treatment not carried out due to patient leaving prior to being seen by health care provider: Secondary | ICD-10-CM | POA: Insufficient documentation

## 2017-01-07 LAB — URINALYSIS, ROUTINE W REFLEX MICROSCOPIC
BILIRUBIN URINE: NEGATIVE
Glucose, UA: NEGATIVE mg/dL
HGB URINE DIPSTICK: NEGATIVE
Ketones, ur: NEGATIVE mg/dL
Leukocytes, UA: NEGATIVE
Nitrite: NEGATIVE
Protein, ur: NEGATIVE mg/dL
Specific Gravity, Urine: 1.01 (ref 1.005–1.030)
pH: 8 (ref 5.0–8.0)

## 2017-01-07 LAB — POCT PREGNANCY, URINE: PREG TEST UR: NEGATIVE

## 2017-01-07 NOTE — MAU Note (Signed)
Pt presents complaining of abdominal pain on the left side of her abdomen that started today. States she had a positive UPT 2 weeks ago. LMP unsure, maybe December. Denies vaginal bleeding or discharge.

## 2017-01-08 NOTE — MAU Provider Note (Signed)
History     CSN: [id number]  Arrival date and time: 01/07/17 2322   First Provider Initiated Contact with Patient 01/08/17 0103      Chief Complaint  Patient presents with  . Pelvic Pain   Pelvic Pain  The patient's primary symptoms include pelvic pain. The patient's pertinent negatives include no vaginal discharge. This is a new problem. The current episode started today. The problem occurs constantly. The problem has been unchanged. Pain severity now: 7/10  The problem affects both sides. She is not pregnant. Associated symptoms include nausea. Pertinent negatives include no chills, dysuria, fever, frequency, urgency or vomiting. The vaginal discharge was normal. There has been no bleeding. She has tried acetaminophen for the symptoms. The treatment provided no relief. Menstrual history: LMP: 10/03/16 and had a +UPT at home about one week ago.    Past Medical History:  Diagnosis Date  . Adopted   . Anxiety   . Asthma   . Bipolar disorder ([place])   . Borderline personality disorder   . Depression   . GERD (gastroesophageal reflux disease)   . Headache    Migraines  . PTSD (post-traumatic stress disorder)   . Suicide attempt     Past Surgical History:  Procedure Laterality Date  . ESOPHAGOGASTRODUODENOSCOPY  06/25/2012   Procedure: ESOPHAGOGASTRODUODENOSCOPY (EGD);  Surgeon: Arta Silence, MD;  Location: [facility];  Service: Endoscopy;  Laterality: N/A;  [name]/ebp  . GANGLION CYST EXCISION    . HYSTEROSCOPY N/A 10/20/2015   Procedure: HYSTEROSCOPY;  Surgeon: Janyth Pupa, DO;  Location: [facility];  Service: Gynecology;  Laterality: N/A;  . IUD REMOVAL N/A 10/20/2015   Procedure: INTRAUTERINE DEVICE (IUD) REMOVAL;  Surgeon: Janyth Pupa, DO;  Location: [facility];  Service: Gynecology;  Laterality: N/A;    Family History  Problem Relation Age of Onset  . Adopted: Yes    Social History  Substance Use Topics  . Smoking status: Light Tobacco Smoker    Packs/day: 0.10   Years: 0.20    Types: Cigarettes  . Smokeless tobacco: Never Used  . Alcohol use No    Allergies:  Allergies  Allergen Reactions  . Hydroxyzine Other (See Comments)    Headaches....fevers  . Icy Hot Rash    Prescriptions Prior to Admission  Medication Sig Dispense Refill Last Dose  . benzonatate (TESSALON) 100 MG capsule Take 1-2 capsules (100-200 mg total) by mouth 3 (three) times daily as needed for cough. 20 capsule 0   . clonazePAM (KLONOPIN) 0.5 MG tablet Take 0.5 mg by mouth 3 (three) times daily as needed for anxiety.    07/25/2016 at Unknown time  . cyclobenzaprine (FLEXERIL) 10 MG tablet Take 0.5-1 tablets (5-10 mg total) by mouth 2 (two) times daily as needed. 20 tablet 0 11/25/2016 at Unknown time  . oseltamivir (TAMIFLU) 75 MG capsule Take 1 capsule (75 mg total) by mouth every 12 (twelve) hours. 10 capsule 0   . ziprasidone (GEODON) 60 MG capsule Take 60 mg by mouth 2 (two) times daily with a meal.    11/25/2016 at Unknown time    Review of Systems  Constitutional: Negative for chills and fever.  Gastrointestinal: Positive for nausea. Negative for vomiting.  Genitourinary: Positive for pelvic pain. Negative for dysuria, frequency, urgency, vaginal bleeding and vaginal discharge.   Physical Exam   Blood pressure 130/85, pulse 97, temperature 98 F (36.7 C), temperature source Oral, resp. rate 18, height 4\' 11"  (1.499 m), weight 160 lb (72.6 kg), last menstrual period 10/03/2016.  Physical Exam  Results for orders placed or performed during the hospital encounter of 01/07/17 (from the past 24 hour(s))  Urinalysis, Routine w reflex microscopic     Status: Abnormal   Collection Time: 01/07/17 11:27 PM  Result Value Ref Range   Color, Urine YELLOW YELLOW   APPearance HAZY (A) CLEAR   Specific Gravity, Urine 1.010 1.005 - 1.030   pH 8.0 5.0 - 8.0   Glucose, UA NEGATIVE NEGATIVE mg/dL   Hgb urine dipstick NEGATIVE NEGATIVE   Bilirubin Urine NEGATIVE NEGATIVE   Ketones,  ur NEGATIVE NEGATIVE mg/dL   Protein, ur NEGATIVE NEGATIVE mg/dL   Nitrite NEGATIVE NEGATIVE   Leukocytes, UA NEGATIVE NEGATIVE  Pregnancy, urine POC     Status: None   Collection Time: 01/07/17 11:40 PM  Result Value Ref Range   Preg Test, Ur NEGATIVE NEGATIVE    MAU Course  Procedures  MDM Discussed negative pregnancy test with the patient and that we recommend a BHCG to confirm. Patient refuses to have BHCG draw, and is planning to leave AMA.   Assessment and Plan  Negative UPT here Patient left AMA   Mathis Bud 01/08/2017, 1:10 AM

## 2017-01-14 ENCOUNTER — Ambulatory Visit (INDEPENDENT_AMBULATORY_CARE_PROVIDER_SITE_OTHER): Payer: BLUE CROSS/BLUE SHIELD | Admitting: Physician Assistant

## 2017-01-14 ENCOUNTER — Encounter: Payer: Self-pay | Admitting: Physician Assistant

## 2017-01-14 VITALS — BP 104/67 | HR 85 | Temp 97.9°F | Ht 59.0 in | Wt 160.2 lb

## 2017-01-14 DIAGNOSIS — F4310 Post-traumatic stress disorder, unspecified: Secondary | ICD-10-CM

## 2017-01-14 DIAGNOSIS — F332 Major depressive disorder, recurrent severe without psychotic features: Secondary | ICD-10-CM

## 2017-01-14 MED ORDER — PRAZOSIN HCL 2 MG PO CAPS: 4.0000 mg | ORAL_CAPSULE | Freq: Every day | ORAL | 2 refills | Status: DC

## 2017-01-14 MED ORDER — ZIPRASIDONE HCL 60 MG PO CAPS: 60.0000 mg | ORAL_CAPSULE | Freq: Two times a day (BID) | ORAL | 2 refills | Status: DC

## 2017-01-14 MED ORDER — TOPIRAMATE 100 MG PO TABS: 100.0000 mg | ORAL_TABLET | Freq: Two times a day (BID) | ORAL | 2 refills | Status: DC

## 2017-01-14 MED ORDER — CLONAZEPAM 0.5 MG PO TABS: 0.5000 mg | ORAL_TABLET | Freq: Three times a day (TID) | ORAL | 5 refills | Status: DC | PRN

## 2017-01-14 NOTE — Progress Notes (Signed)
BP 104/67   Pulse 85   Temp 97.9 F (36.6 C) (Oral)   Ht 4\' 11"  (1.499 m)   Wt 160 lb 3.2 oz (72.7 kg)   BMI 32.36 kg/m    Subjective:    Patient ID: Kelly Barry, female    DOB: 05-07-1991, 26 y.o.   MRN: [id number]  HPI: Kelly Barry is a 26 y.o. female presenting on 01/14/2017 for Medication Refill  This patient comes in for periodic recheck on medications and conditions including mood disorder, anxiety, PTSD. Patient here to be established as new patient at [facility].  This patient is known to me from [facility]. Patient reports she has been very stable with her current medications. She is seeing a gynecologist for possible planned pregnancy. They would like for her to reduce some of her medications. We will start with her Zoloft and reduce it over the next month. I will recheck her in one month to see how things are going. If there are any problems she is to call back sooner. She still sees her therapist 1 time a month at most.  All medications are reviewed today. There are no reports of any problems with the medications. All of the medical conditions are reviewed and updated.  Lab work is reviewed and will be ordered as medically necessary. There are no new problems reported with today's visit.    Relevant past medical, surgical, family and social history reviewed and updated as indicated. Allergies and medications reviewed and updated.  Past Medical History:  Diagnosis Date  . Adopted   . Anxiety   . Asthma   . Bipolar disorder (Kelly Barry)   . Borderline personality disorder   . Depression   . GERD (gastroesophageal reflux disease)   . Headache    Migraines  . PTSD (post-traumatic stress disorder)   . Suicide attempt     Past Surgical History:  Procedure Laterality Date  . ESOPHAGOGASTRODUODENOSCOPY  06/25/2012   Procedure: ESOPHAGOGASTRODUODENOSCOPY (EGD);  Surgeon: Arta Silence, MD;  Location: [facility];  Service: Endoscopy;   Laterality: N/A;  Kelly Barry/ebp  . GANGLION CYST EXCISION    . HYSTEROSCOPY N/A 10/20/2015   Procedure: HYSTEROSCOPY;  Surgeon: Janyth Pupa, DO;  Location: [facility];  Service: Gynecology;  Laterality: N/A;  . IUD REMOVAL N/A 10/20/2015   Procedure: INTRAUTERINE DEVICE (IUD) REMOVAL;  Surgeon: Janyth Pupa, DO;  Location: [facility];  Service: Gynecology;  Laterality: N/A;    Review of Systems  Constitutional: Negative.  Negative for activity change, fatigue and fever.  HENT: Negative.   Eyes: Negative.   Respiratory: Negative.  Negative for cough.   Cardiovascular: Negative.  Negative for chest pain.  Gastrointestinal: Negative.  Negative for abdominal pain.  Endocrine: Negative.   Genitourinary: Negative.  Negative for dysuria.  Musculoskeletal: Negative.   Skin: Negative.   Neurological: Negative.   Psychiatric/Behavioral: Negative for decreased concentration, dysphoric mood, sleep disturbance and suicidal ideas. The patient is nervous/anxious. The patient is not hyperactive.     Allergies as of 01/14/2017      Reactions   Hydroxyzine Other (See Comments)   Headaches....fevers   Icy Hot Rash      Medication List       Accurate as of 01/14/17  4:59 PM. Always use your most recent med list.          clonazePAM 0.5 MG tablet Commonly known as:  KLONOPIN Take 1 tablet (0.5 mg total) by mouth 3 (three) times  daily as needed for anxiety.   prazosin 2 MG capsule Commonly known as:  MINIPRESS Take 2 capsules (4 mg total) by mouth at bedtime.   sertraline 100 MG tablet Commonly known as:  ZOLOFT Take 50 mg by mouth daily. Take 1/2 tab daily 10 days, then 1/2 tab every other day   topiramate 100 MG tablet Commonly known as:  TOPAMAX Take 1 tablet (100 mg total) by mouth 2 (two) times daily.   ziprasidone 60 MG capsule Commonly known as:  GEODON Take 1 capsule (60 mg total) by mouth 2 (two) times daily with a meal.          Objective:    BP 104/67   Pulse 85   Temp  97.9 F (36.6 C) (Oral)   Ht 4\' 11"  (1.499 m)   Wt 160 lb 3.2 oz (72.7 kg)   BMI 32.36 kg/m   Allergies  Allergen Reactions  . Hydroxyzine Other (See Comments)    Headaches....fevers  . Icy Hot Rash    Physical Exam  Constitutional: She is oriented to person, place, and time. She appears well-developed and well-nourished.  HENT:  Head: Normocephalic and atraumatic.  Eyes: Conjunctivae and EOM are normal. Pupils are equal, round, and reactive to light.  Cardiovascular: Normal rate, regular rhythm, normal heart sounds and intact distal pulses.   Pulmonary/Chest: Effort normal and breath sounds normal.  Abdominal: Soft. Bowel sounds are normal.  Neurological: She is alert and oriented to person, place, and time. She has normal reflexes.  Skin: Skin is warm and dry. No rash noted.  Psychiatric: She has a normal mood and affect. Her behavior is normal. Judgment and thought content normal.  Nursing note and vitals reviewed.      Assessment & Plan:   1. Severe episode of recurrent major depressive disorder, without psychotic features ([place])  2. PTSD (post-traumatic stress disorder)   Current Outpatient Prescriptions:  .  clonazePAM (KLONOPIN) 0.5 MG tablet, Take 1 tablet (0.5 mg total) by mouth 3 (three) times daily as needed for anxiety., Disp: 60 tablet, Rfl: 5 .  prazosin (MINIPRESS) 2 MG capsule, Take 2 capsules (4 mg total) by mouth at bedtime., Disp: 60 capsule, Rfl: 2 .  sertraline (ZOLOFT) 100 MG tablet, Take 50 mg by mouth daily. Take 1/2 tab daily 10 days, then 1/2 tab every other day, Disp: , Rfl:  .  topiramate (TOPAMAX) 100 MG tablet, Take 1 tablet (100 mg total) by mouth 2 (two) times daily., Disp: 60 tablet, Rfl: 2 .  ziprasidone (GEODON) 60 MG capsule, Take 1 capsule (60 mg total) by mouth 2 (two) times daily with a meal., Disp: 60 capsule, Rfl: 2  Continue all other maintenance medications as listed above.  Follow up plan: Return in about 4 weeks (around  02/11/2017) for recheck.  Educational handout given for bipolar disorder  Terald Sleeper PA-C [facility] [address]  [place], [place] [address] [phone]   01/14/2017, 4:59 PM

## 2017-01-14 NOTE — Patient Instructions (Signed)
Bipolar 1 Disorder Bipolar 1 disorder is a mental health disorder in which a person has episodes of emotional highs (mania), and may also have episodes of emotional lows (depression) in addition to highs. Bipolar 1 disorder is different from other bipolar disorders because it involves extreme manic episodes. These episodes last at least one week or involve symptoms that are so severe that hospitalization is needed to keep the person safe. What increases the risk? The cause of this condition is not known. However, certain factors make you more likely to have bipolar disorder, such as:  Having a family member with the disorder.  An imbalance of certain chemicals in the brain (neurotransmitters).  Stress, such as illness, financial problems, or a death.  Certain conditions that affect the brain or spinal cord (neurologic conditions).  Brain injury (trauma).  Having another mental health disorder, such as:  Obsessive compulsive disorder.  Schizophrenia. What are the signs or symptoms? Symptoms of mania include:  Very high self-esteem or self-confidence.  Decreased need for sleep.  Unusual talkativeness or feeling a need to keep talking. Speech may be very fast. It may seem like you cannot stop talking.  Racing thoughts or constant talking, with quick shifts between topics that may or may not be related (flight of ideas).  Decreased ability to focus or concentrate.  Increased purposeful activity, such as work, studies, or social activity.  Increased nonproductive activity. This could be pacing, squirming and fidgeting, or finger and toe tapping.  Impulsive behavior and poor judgment. This may result in high-risk activities, such as having unprotected sex or spending a lot of money. Symptoms of depression include:  Feeling sad, hopeless, or helpless.  Frequent or uncontrollable crying.  Lack of feeling or caring about anything.  Sleeping too much.  Moving more slowly than  usual.  Not being able to enjoy things you used to enjoy.  Wanting to be alone all the time.  Feeling guilty or worthless.  Lack of energy or motivation.  Trouble concentrating or remembering.  Trouble making decisions.  Increased appetite.  Thoughts of death, or the desire to harm yourself. Sometimes, you may have a mixed mood. This means having symptoms of depression and mania. Stress can make symptoms worse. How is this diagnosed? To diagnose bipolar disorder, your health care provider may ask about your:  Emotional episodes.  Medical history.  Alcohol and drug use. This includes prescription medicines. Certain medical conditions and substances can cause symptoms that seem like bipolar disorder (secondary bipolar disorder). How is this treated? Bipolar disorder is a long-term (chronic) illness. It is best controlled with ongoing (continuous) treatment rather than treatment only when symptoms occur. Treatment may include:  Medicine. Medicine can be prescribed by a provider who specializes in treating mental disorders (psychiatrist).  Medicines called mood stabilizers are usually prescribed.  If symptoms occur even while taking a mood stabilizer, other medicines may be added.  Psychotherapy. Some forms of talk therapy, such as cognitive-behavioral therapy (CBT), can provide support, education, and guidance.  Coping methods, such as journaling or relaxation exercises. These may include:  Yoga.  Meditation.  Deep breathing.  Lifestyle changes, such as:  Limiting alcohol and drug use.  Exercising regularly.  Getting plenty of sleep.  Making healthy eating choices. A combination of medicine, talk therapy, and coping methods is best. A procedure in which electricity is applied to the brain through the scalp (electroconvulsive therapy) may be used in cases of severe mania when medicine and psychotherapy work too slowly  Limiting alcohol and drug use.  ? Exercising regularly.  ? Getting plenty of sleep.  ? Making healthy eating choices.    A combination of medicine, talk therapy, and coping methods is best. A procedure in which electricity is applied to the brain through the scalp (electroconvulsive therapy) may be used in cases of severe mania when medicine and psychotherapy work too slowly or do not work.  Follow these  instructions at home:  Activity     · Return to your normal activities as told by your health care provider.  · Find activities that you enjoy, and make time to do them.  · Exercise regularly as told by your health care provider.  Lifestyle   · Limit alcohol intake to no more than 1 drink a day for nonpregnant women and 2 drinks a day for men. One drink equals 12 oz of beer, 5 oz of wine, or 1½ oz of hard liquor.  · Follow a set schedule for eating and sleeping.  · Eat a balanced diet that includes fresh fruits and vegetables, whole grains, low-fat dairy, and lean meat.  · Get 7-8 hours of sleep each night.  General instructions   · Take over-the-counter and prescription medicines only as told by your health care provider.  · Think about joining a support group. Your health care provider may be able to recommend a support group.  · Talk with your family and loved ones about your treatment goals and how they can help.  · Keep all follow-up visits as told by your health care provider. This is important.  Where to find more information:  For more information about bipolar disorder, visit the following websites:  · National Alliance on Mental Illness: www.nami.org  · U.S. National Institute of Mental Health: www.nimh.nih.gov    Contact a health care provider if:  · Your symptoms get worse.  · You have side effects from your medicine, and they get worse.  · You have trouble sleeping.  · You have trouble doing daily activities.  · You feel unsafe in your surroundings.  · You are dealing with substance abuse.  Get help right away if:  · You have new symptoms.  · You have thoughts about harming yourself.  · You self-harm.  This information is not intended to replace advice given to you by your health care provider. Make sure you discuss any questions you have with your health care provider.  Document Released: 01/14/2001 Document Revised: 06/03/2016 Document Reviewed: 06/07/2016  Elsevier Interactive Patient Education ©  2017 [employer] Inc[employer]

## 2017-01-29 ENCOUNTER — Encounter (HOSPITAL_COMMUNITY): Payer: Self-pay | Admitting: *Deleted

## 2017-01-29 ENCOUNTER — Encounter (HOSPITAL_COMMUNITY): Payer: Self-pay | Admitting: Family Medicine

## 2017-01-29 ENCOUNTER — Telehealth (HOSPITAL_COMMUNITY): Payer: Self-pay | Admitting: Emergency Medicine

## 2017-01-29 ENCOUNTER — Ambulatory Visit (HOSPITAL_COMMUNITY)
Admission: EM | Admit: 2017-01-29 | Discharge: 2017-01-29 | Disposition: A | Discharge status: Home / Self Care | Payer: BLUE CROSS/BLUE SHIELD | Attending: Internal Medicine | Admitting: Internal Medicine

## 2017-01-29 ENCOUNTER — Emergency Department (HOSPITAL_COMMUNITY)
Admission: EM | Admit: 2017-01-29 | Discharge: 2017-01-30 | Disposition: A | Discharge status: Home / Self Care | Payer: BLUE CROSS/BLUE SHIELD | Attending: Emergency Medicine | Admitting: Emergency Medicine

## 2017-01-29 DIAGNOSIS — H6501 Acute serous otitis media, right ear: Secondary | ICD-10-CM

## 2017-01-29 DIAGNOSIS — M545 Low back pain, unspecified: Secondary | ICD-10-CM

## 2017-01-29 DIAGNOSIS — T378X5A Adverse effect of other specified systemic anti-infectives and antiparasitics, initial encounter: Secondary | ICD-10-CM | POA: Insufficient documentation

## 2017-01-29 DIAGNOSIS — J45909 Unspecified asthma, uncomplicated: Secondary | ICD-10-CM | POA: Insufficient documentation

## 2017-01-29 DIAGNOSIS — T50905A Adverse effect of unspecified drugs, medicaments and biological substances, initial encounter: Secondary | ICD-10-CM

## 2017-01-29 DIAGNOSIS — F17210 Nicotine dependence, cigarettes, uncomplicated: Secondary | ICD-10-CM | POA: Diagnosis not present

## 2017-01-29 DIAGNOSIS — T887XXA Unspecified adverse effect of drug or medicament, initial encounter: Secondary | ICD-10-CM | POA: Insufficient documentation

## 2017-01-29 DIAGNOSIS — Y69 Unspecified misadventure during surgical and medical care: Secondary | ICD-10-CM | POA: Insufficient documentation

## 2017-01-29 DIAGNOSIS — J029 Acute pharyngitis, unspecified: Secondary | ICD-10-CM | POA: Insufficient documentation

## 2017-01-29 DIAGNOSIS — T450X5A Adverse effect of antiallergic and antiemetic drugs, initial encounter: Secondary | ICD-10-CM | POA: Insufficient documentation

## 2017-01-29 DIAGNOSIS — T486X5A Adverse effect of antiasthmatics, initial encounter: Secondary | ICD-10-CM | POA: Diagnosis not present

## 2017-01-29 DIAGNOSIS — Z79899 Other long term (current) drug therapy: Secondary | ICD-10-CM | POA: Diagnosis not present

## 2017-01-29 DIAGNOSIS — T360X5A Adverse effect of penicillins, initial encounter: Secondary | ICD-10-CM | POA: Insufficient documentation

## 2017-01-29 DIAGNOSIS — J00 Acute nasopharyngitis [common cold]: Secondary | ICD-10-CM | POA: Diagnosis not present

## 2017-01-29 DIAGNOSIS — J028 Acute pharyngitis due to other specified organisms: Secondary | ICD-10-CM

## 2017-01-29 LAB — POCT RAPID STREP A: Streptococcus, Group A Screen (Direct): NEGATIVE

## 2017-01-29 MED ORDER — IPRATROPIUM BROMIDE 0.06 % NA SOLN: 2.0000 | Freq: Four times a day (QID) | NASAL | 0 refills | Status: DC

## 2017-01-29 MED ORDER — FLUCONAZOLE 150 MG PO TABS: 150.0000 mg | ORAL_TABLET | Freq: Every day | ORAL | 1 refills | Status: DC

## 2017-01-29 MED ORDER — MECLIZINE HCL 25 MG PO TABS: 25.0000 mg | ORAL_TABLET | Freq: Three times a day (TID) | ORAL | 0 refills | Status: DC | PRN

## 2017-01-29 MED ORDER — MECLIZINE HCL 25 MG PO TABS
25.0000 mg | ORAL_TABLET | Freq: Three times a day (TID) | ORAL | 0 refills | Status: DC | PRN
Start: 2017-01-29 — End: 2017-01-30

## 2017-01-29 MED ORDER — AMOXICILLIN 875 MG PO TABS: 875.0000 mg | ORAL_TABLET | Freq: Two times a day (BID) | ORAL | 0 refills | Status: DC

## 2017-01-29 NOTE — ED Provider Notes (Signed)
CSN: [id number]     Arrival date & time 01/29/17  1009 History   None    Chief Complaint  Patient presents with  . Sore Throat  . Fever   (Consider location/radiation/quality/duration/timing/severity/associated sxs/prior Treatment) Patient c/o sore throat, fever, and right ear pain for 2 days.   The history is provided by the patient.  Sore Throat  This is a new problem. The problem occurs constantly. The problem has not changed since onset.Nothing aggravates the symptoms. Nothing relieves the symptoms. She has tried nothing for the symptoms.  Fever  Associated symptoms: ear pain and sore throat     Past Medical History:  Diagnosis Date  . Adopted   . Anxiety   . Asthma   . Bipolar disorder ([place])   . Borderline personality disorder   . Depression   . GERD (gastroesophageal reflux disease)   . Headache    Migraines  . PTSD (post-traumatic stress disorder)   . Suicide attempt    Past Surgical History:  Procedure Laterality Date  . ESOPHAGOGASTRODUODENOSCOPY  06/25/2012   Procedure: ESOPHAGOGASTRODUODENOSCOPY (EGD);  Surgeon: Arta Silence, MD;  Location: [facility];  Service: Endoscopy;  Laterality: N/A;  [name]/ebp  . GANGLION CYST EXCISION    . HYSTEROSCOPY N/A 10/20/2015   Procedure: HYSTEROSCOPY;  Surgeon: Janyth Pupa, DO;  Location: [facility];  Service: Gynecology;  Laterality: N/A;  . IUD REMOVAL N/A 10/20/2015   Procedure: INTRAUTERINE DEVICE (IUD) REMOVAL;  Surgeon: Janyth Pupa, DO;  Location: [facility];  Service: Gynecology;  Laterality: N/A;   Family History  Problem Relation Age of Onset  . Adopted: Yes   Social History  Substance Use Topics  . Smoking status: Light Tobacco Smoker    Packs/day: 0.10    Years: 0.20    Types: Cigarettes  . Smokeless tobacco: Never Used  . Alcohol use No   OB History    Gravida Para Term Preterm AB Living   1       1     SAB TAB Ectopic Multiple Live Births   1             Review of Systems  Constitutional:  Positive for fever.  HENT: Positive for ear pain and sore throat.   Eyes: Negative.   Respiratory: Negative.   Cardiovascular: Negative.   Gastrointestinal: Negative.   Endocrine: Negative.   Genitourinary: Negative.   Musculoskeletal: Negative.   Allergic/Immunologic: Negative.   Neurological: Negative.   Hematological: Negative.   Psychiatric/Behavioral: Negative.     Allergies  Hydroxyzine and Icy hot  Home Medications   Prior to Admission medications   Medication Sig Start Date End Date Taking? Authorizing Provider  amoxicillin (AMOXIL) 875 MG tablet Take 1 tablet (875 mg total) by mouth 2 (two) times daily. 01/29/17   Lysbeth Penner, FNP  clonazePAM (KLONOPIN) 0.5 MG tablet Take 1 tablet (0.5 mg total) by mouth 3 (three) times daily as needed for anxiety. 01/14/17   Terald Sleeper, PA-C  fluconazole (DIFLUCAN) 150 MG tablet Take 1 tablet (150 mg total) by mouth daily. 01/29/17   Lysbeth Penner, FNP  ipratropium (ATROVENT) 0.06 % nasal spray Place 2 sprays into both nostrils 4 (four) times daily. 01/29/17   Lysbeth Penner, FNP  meclizine (ANTIVERT) 25 MG tablet Take 1 tablet (25 mg total) by mouth 3 (three) times daily as needed for dizziness. 01/29/17   Lysbeth Penner, FNP  prazosin (MINIPRESS) 2 MG capsule Take 2 capsules (4 mg total) by  mouth at bedtime. 01/14/17   Terald Sleeper, PA-C  sertraline (ZOLOFT) 100 MG tablet Take 50 mg by mouth daily. Take 1/2 tab daily 10 days, then 1/2 tab every other day    Historical Provider, MD  topiramate (TOPAMAX) 100 MG tablet Take 1 tablet (100 mg total) by mouth 2 (two) times daily. 01/14/17   Terald Sleeper, PA-C  ziprasidone (GEODON) 60 MG capsule Take 1 capsule (60 mg total) by mouth 2 (two) times daily with a meal. 01/14/17   Terald Sleeper, PA-C   Meds Ordered and Administered this Visit  Medications - No data to display  BP (!) 136/112   Pulse 88   Temp 98.9 F (37.2 C)   Resp 16   SpO2 98%  No data found.   Physical Exam   Constitutional: She is oriented to person, place, and time. She appears well-developed and well-nourished.  HENT:  Head: Normocephalic and atraumatic.  Left Ear: External ear normal.  Mouth/Throat: Oropharynx is clear and moist.  AS TM erythematous   Eyes: Conjunctivae and EOM are normal. Pupils are equal, round, and reactive to light.  Neck: Normal range of motion. Neck supple.  Cardiovascular: Normal rate, regular rhythm and normal heart sounds.   Pulmonary/Chest: Effort normal and breath sounds normal.  Abdominal: Soft. Bowel sounds are normal.  Musculoskeletal: Normal range of motion.  Neurological: She is alert and oriented to person, place, and time.  Nursing note and vitals reviewed.   Urgent Care Course     Procedures (including critical care time)  Labs Review Labs Reviewed  POCT RAPID STREP A    Imaging Review No results found.   Visual Acuity Review  Right Eye Distance:   Left Eye Distance:   Bilateral Distance:    Right Eye Near:   Left Eye Near:    Bilateral Near:         MDM   1. Acute nasopharyngitis   2. Acute pharyngitis due to other specified organisms   3. Right acute serous otitis media, recurrence not specified    Atrovent Nasal Spray Amoxicillin 875mg  one po bid x 7 days #14 Lidocaine viscous 90ml po q 4 hours prn #149ml  Push po fluids, rest, tylenol and motrin otc prn as directed for fever, arthralgias, and myalgias.  Follow up prn if sx's continue or persist.    Lysbeth Penner, FNP 01/29/17 1125

## 2017-01-29 NOTE — Telephone Encounter (Signed)
Patient requested that her prescriptions be resent to [facility] on [address].

## 2017-01-29 NOTE — ED Triage Notes (Signed)
Pt seen at [facility] urgent care today, prescribed amoxicillin, fluconazole, ipratropium, and meclizine. Pt states she then had lower back pain, nausea, emesis and restlessness. Pt is concerned she is having a reaction to the medication.

## 2017-01-29 NOTE — ED Triage Notes (Signed)
Pt here for sore throat and fever

## 2017-01-30 LAB — COMPREHENSIVE METABOLIC PANEL
ALT: 12 U/L — ABNORMAL LOW (ref 14–54)
AST: 15 U/L (ref 15–41)
Albumin: 4 g/dL (ref 3.5–5.0)
Alkaline Phosphatase: 64 U/L (ref 38–126)
Anion gap: 5 (ref 5–15)
BUN: 11 mg/dL (ref 6–20)
CHLORIDE: 114 mmol/L — AB (ref 101–111)
CO2: 20 mmol/L — ABNORMAL LOW (ref 22–32)
Calcium: 9.5 mg/dL (ref 8.9–10.3)
Creatinine, Ser: 0.57 mg/dL (ref 0.44–1.00)
Glucose, Bld: 94 mg/dL (ref 65–99)
POTASSIUM: 3.4 mmol/L — AB (ref 3.5–5.1)
SODIUM: 139 mmol/L (ref 135–145)
Total Bilirubin: 0.5 mg/dL (ref 0.3–1.2)
Total Protein: 7.6 g/dL (ref 6.5–8.1)

## 2017-01-30 LAB — CBC WITH DIFFERENTIAL/PLATELET
BASOS ABS: 0 10*3/uL (ref 0.0–0.1)
Basophils Relative: 0 %
EOS ABS: 0.1 10*3/uL (ref 0.0–0.7)
EOS PCT: 1 %
HCT: 38.4 % (ref 36.0–46.0)
Hemoglobin: 13.4 g/dL (ref 12.0–15.0)
LYMPHS PCT: 41 %
Lymphs Abs: 4.4 10*3/uL — ABNORMAL HIGH (ref 0.7–4.0)
MCH: 29.8 pg (ref 26.0–34.0)
MCHC: 34.9 g/dL (ref 30.0–36.0)
MCV: 85.3 fL (ref 78.0–100.0)
MONO ABS: 0.8 10*3/uL (ref 0.1–1.0)
Monocytes Relative: 7 %
Neutro Abs: 5.4 10*3/uL (ref 1.7–7.7)
Neutrophils Relative %: 51 %
PLATELETS: 330 10*3/uL (ref 150–400)
RBC: 4.5 MIL/uL (ref 3.87–5.11)
RDW: 12.8 % (ref 11.5–15.5)
WBC: 10.7 10*3/uL — ABNORMAL HIGH (ref 4.0–10.5)

## 2017-01-30 LAB — URINALYSIS, ROUTINE W REFLEX MICROSCOPIC
Bilirubin Urine: NEGATIVE
Glucose, UA: NEGATIVE mg/dL
Hgb urine dipstick: NEGATIVE
Ketones, ur: NEGATIVE mg/dL
LEUKOCYTES UA: NEGATIVE
Nitrite: NEGATIVE
PROTEIN: NEGATIVE mg/dL
Specific Gravity, Urine: 1.027 (ref 1.005–1.030)
pH: 5 (ref 5.0–8.0)

## 2017-01-30 LAB — LIPASE, BLOOD: LIPASE: 28 U/L (ref 11–51)

## 2017-01-30 LAB — I-STAT BETA HCG BLOOD, ED (MC, WL, AP ONLY)

## 2017-01-30 MED ORDER — KETOROLAC TROMETHAMINE 30 MG/ML IJ SOLN
15.0000 mg | Freq: Once | INTRAMUSCULAR | Status: AC
  Administered 2017-01-30: 15 mg via INTRAVENOUS
  Filled 2017-01-30: qty 1

## 2017-01-30 MED ORDER — ONDANSETRON HCL 4 MG/2ML IJ SOLN
4.0000 mg | Freq: Once | INTRAMUSCULAR | Status: AC
  Administered 2017-01-30: 4 mg via INTRAVENOUS
  Filled 2017-01-30: qty 2

## 2017-01-30 MED ORDER — SODIUM CHLORIDE 0.9 % IV BOLUS (SEPSIS)
1000.0000 mL | Freq: Once | INTRAVENOUS | Status: AC
  Administered 2017-01-30: 1000 mL via INTRAVENOUS

## 2017-01-30 NOTE — ED Provider Notes (Signed)
[facility] DEPT Provider Note   CSN: [id number] Arrival date & time: 01/29/17  2128     History   Chief Complaint Chief Complaint  Patient presents with  . Back Pain  . Emesis  . Medication Reaction    HPI Kelly Barry is a 26 y.o. female.  HPI The patient was seen at an urgent care center earlier this morning with complaints of sore throat and fever and ear pain. Patient was diagnosed with acute pharyngitis and acute serous otitis media. The patient was prescribed Atrovent, amoxicillin, meclizine, and Diflucan to help with possible yeast infection. Patient states after taking those medications she began having a nausea vomiting and severe pain in her lower back. She has been spitting up clear mucus. She wonders if it's an interaction with her medications. She denies any dysuria. No chest pain or shortness of breath. Past Medical History:  Diagnosis Date  . Adopted   . Anxiety   . Asthma   . Bipolar disorder ([place])   . Borderline personality disorder   . Depression   . GERD (gastroesophageal reflux disease)   . Headache    Migraines  . PTSD (post-traumatic stress disorder)   . Suicide attempt     Patient Active Problem List   Diagnosis Date Noted  . PTSD (post-traumatic stress disorder) 01/14/2017  . Suicidal ideations 03/28/2016  . MDD (major depressive disorder), recurrent episode, severe ([place]) 03/27/2016    Past Surgical History:  Procedure Laterality Date  . ESOPHAGOGASTRODUODENOSCOPY  06/25/2012   Procedure: ESOPHAGOGASTRODUODENOSCOPY (EGD);  Surgeon: Arta Silence, MD;  Location: [facility];  Service: Endoscopy;  Laterality: N/A;  [name]/ebp  . GANGLION CYST EXCISION    . HYSTEROSCOPY N/A 10/20/2015   Procedure: HYSTEROSCOPY;  Surgeon: Janyth Pupa, DO;  Location: [facility];  Service: Gynecology;  Laterality: N/A;  . IUD REMOVAL N/A 10/20/2015   Procedure: INTRAUTERINE DEVICE (IUD) REMOVAL;  Surgeon: Janyth Pupa, DO;  Location: [facility];  Service:  Gynecology;  Laterality: N/A;    OB History    Gravida Para Term Preterm AB Living   1       1     SAB TAB Ectopic Multiple Live Births   1               Home Medications    Prior to Admission medications   Medication Sig Start Date End Date Taking? Authorizing Provider  amoxicillin (AMOXIL) 875 MG tablet Take 1 tablet (875 mg total) by mouth 2 (two) times daily. 01/29/17   Lysbeth Penner, FNP  clonazePAM (KLONOPIN) 0.5 MG tablet Take 1 tablet (0.5 mg total) by mouth 3 (three) times daily as needed for anxiety. 01/14/17   Terald Sleeper, PA-C  fluconazole (DIFLUCAN) 150 MG tablet Take 1 tablet (150 mg total) by mouth daily. 01/29/17   Lysbeth Penner, FNP  prazosin (MINIPRESS) 2 MG capsule Take 2 capsules (4 mg total) by mouth at bedtime. 01/14/17   Terald Sleeper, PA-C  sertraline (ZOLOFT) 100 MG tablet Take 50 mg by mouth daily. Take 1/2 tab daily 10 days, then 1/2 tab every other day    Historical Provider, MD  topiramate (TOPAMAX) 100 MG tablet Take 1 tablet (100 mg total) by mouth 2 (two) times daily. 01/14/17   Terald Sleeper, PA-C  ziprasidone (GEODON) 60 MG capsule Take 1 capsule (60 mg total) by mouth 2 (two) times daily with a meal. 01/14/17   Terald Sleeper, PA-C    Family History Family  History  Problem Relation Age of Onset  . Adopted: Yes    Social History Social History  Substance Use Topics  . Smoking status: Light Tobacco Smoker    Packs/day: 0.10    Years: 0.20    Types: Cigarettes  . Smokeless tobacco: Never Used  . Alcohol use No     Allergies   Hydroxyzine and Icy hot   Review of Systems Review of Systems  All other systems reviewed and are negative.    Physical Exam Updated Vital Signs BP 109/72 (BP Location: Left Arm)   Pulse 80   Temp 98.7 F (37.1 C) (Oral)   Resp 14   Ht 4\' 11"  (1.499 m)   Wt 73.2 kg   LMP 01/22/2017   SpO2 100%   BMI 32.58 kg/m   Physical Exam  Constitutional: She appears well-developed and well-nourished.    HENT:  Head: Normocephalic and atraumatic.  Right Ear: External ear normal.  Left Ear: External ear normal.  Mouth/Throat: No oropharyngeal exudate.  Eyes: Conjunctivae are normal. Right eye exhibits no discharge. Left eye exhibits no discharge. No scleral icterus.  Neck: Neck supple. No tracheal deviation present.  Cardiovascular: Normal rate, regular rhythm and intact distal pulses.   Pulmonary/Chest: Effort normal and breath sounds normal. No stridor. No respiratory distress. She has no wheezes. She has no rales.  Abdominal: Soft. Bowel sounds are normal. She exhibits no distension. There is no tenderness. There is no rebound and no guarding.  Spitting up clear mucus in an emesis bag  Musculoskeletal: She exhibits no edema or tenderness.  No rash in her lower back, no CVA tenderness  Neurological: She is alert. She has normal strength. No cranial nerve deficit (no facial droop, extraocular movements intact, no slurred speech) or sensory deficit. She exhibits normal muscle tone. She displays no seizure activity. Coordination normal.  Skin: Skin is warm and dry. No rash noted.  Psychiatric: She has a normal mood and affect.  Nursing note and vitals reviewed.    ED Treatments / Results  Labs (all labs ordered are listed, but only abnormal results are displayed) Labs Reviewed  CBC WITH DIFFERENTIAL/PLATELET - Abnormal; Notable for the following:       Result Value   WBC 10.7 (*)    Lymphs Abs 4.4 (*)    All other components within normal limits  COMPREHENSIVE METABOLIC PANEL - Abnormal; Notable for the following:    Potassium 3.4 (*)    Chloride 114 (*)    CO2 20 (*)    ALT 12 (*)    All other components within normal limits  URINALYSIS, ROUTINE W REFLEX MICROSCOPIC - Abnormal; Notable for the following:    APPearance HAZY (*)    All other components within normal limits  LIPASE, BLOOD  I-STAT BETA HCG BLOOD, ED ([facility], [facility], [facility] ONLY)     Radiology No results  found.  Procedures Procedures (including critical care time)  Medications Ordered in ED Medications  ketorolac (TORADOL) 30 MG/ML injection 15 mg (15 mg Intravenous Given 01/30/17 0049)  sodium chloride 0.9 % bolus 1,000 mL (1,000 mLs Intravenous New Bag/Given 01/30/17 0049)  ondansetron (ZOFRAN) injection 4 mg (4 mg Intravenous Given 01/30/17 0049)     Initial Impression / Assessment and Plan / ED Course  I have reviewed the triage vital signs and the nursing notes.  Pertinent labs & imaging results that were available during my care of the patient were reviewed by me and considered in my medical decision  making (see chart for details).  Clinical Course as of Jan 30 137  Wed Jan 30, 2017  0138 Labs reviewed.    [JK]    Clinical Course User Index [JK] Dorie Rank, MD   Pt was treated with fluids and nsaids.  Sx have improved.  We discussed possible medication interactions. I think she can safely discontinue meclizine as well as the Atrovent. I think it unlikely that Diflucan caused any of her symptoms but she can also stop that and just take Monistat if she develops a yeast infection. Patient will continue her amoxicillin. Take NSAIDs as needed for pain.  Final Clinical Impressions(s) / ED Diagnoses   Final diagnoses:  Acute bilateral low back pain without sciatica  Adverse effect of drug, initial encounter    New Prescriptions Current Discharge Medication List       Dorie Rank, MD 01/30/17 [id number]

## 2017-01-30 NOTE — Discharge Instructions (Signed)
Take Tylenol or ibuprofen as needed for your pain, stop the Atrovent and meclizine as we discussed is that could be contributing to the side effects,

## 2017-01-31 LAB — CULTURE, GROUP A STREP (THRC)

## 2017-02-13 ENCOUNTER — Inpatient Hospital Stay (HOSPITAL_COMMUNITY)
Admission: AD | Admit: 2017-02-13 | Discharge: 2017-02-14 | Disposition: A | Discharge status: Home / Self Care | Payer: BLUE CROSS/BLUE SHIELD | Source: Ambulatory Visit | Attending: Obstetrics & Gynecology | Admitting: Obstetrics & Gynecology

## 2017-02-13 DIAGNOSIS — R1030 Lower abdominal pain, unspecified: Secondary | ICD-10-CM | POA: Diagnosis not present

## 2017-02-13 DIAGNOSIS — F17210 Nicotine dependence, cigarettes, uncomplicated: Secondary | ICD-10-CM | POA: Diagnosis not present

## 2017-02-13 DIAGNOSIS — Z9889 Other specified postprocedural states: Secondary | ICD-10-CM | POA: Insufficient documentation

## 2017-02-13 DIAGNOSIS — R51 Headache: Secondary | ICD-10-CM | POA: Insufficient documentation

## 2017-02-13 DIAGNOSIS — Z79899 Other long term (current) drug therapy: Secondary | ICD-10-CM | POA: Insufficient documentation

## 2017-02-13 DIAGNOSIS — R112 Nausea with vomiting, unspecified: Secondary | ICD-10-CM | POA: Insufficient documentation

## 2017-02-13 DIAGNOSIS — G4489 Other headache syndrome: Secondary | ICD-10-CM

## 2017-02-14 ENCOUNTER — Encounter (HOSPITAL_COMMUNITY): Payer: Self-pay | Admitting: *Deleted

## 2017-02-14 DIAGNOSIS — R112 Nausea with vomiting, unspecified: Secondary | ICD-10-CM

## 2017-02-14 LAB — URINALYSIS, ROUTINE W REFLEX MICROSCOPIC
Bilirubin Urine: NEGATIVE
GLUCOSE, UA: NEGATIVE mg/dL
Hgb urine dipstick: NEGATIVE
Ketones, ur: NEGATIVE mg/dL
LEUKOCYTES UA: NEGATIVE
Nitrite: NEGATIVE
PH: 8 (ref 5.0–8.0)
Protein, ur: NEGATIVE mg/dL
SPECIFIC GRAVITY, URINE: 1.019 (ref 1.005–1.030)

## 2017-02-14 LAB — POCT PREGNANCY, URINE: Preg Test, Ur: NEGATIVE

## 2017-02-14 MED ORDER — PROMETHAZINE HCL 25 MG PO TABS: 25.0000 mg | ORAL_TABLET | Freq: Four times a day (QID) | ORAL | 0 refills | Status: DC | PRN

## 2017-02-14 MED ORDER — PROMETHAZINE HCL 25 MG PO TABS
25.0000 mg | ORAL_TABLET | Freq: Once | ORAL | Status: AC
  Administered 2017-02-14: 25 mg via ORAL
  Filled 2017-02-14: qty 1

## 2017-02-14 NOTE — MAU Provider Note (Signed)
Chief Complaint:  Nausea and Headache   First Provider Initiated Contact with Patient 02/14/17 0141      HPI: Kelly Barry is a 26 y.o. G1P0010 who presents to maternity admissions reporting nausea today, vomited 2 days ago. Initially presented with lower abdominal pain but states she has not had any pain today, is just here for a pregnancy test.. Does have some intermittent nausea. She reports no vaginal bleeding, vaginal itching/burning, urinary symptoms, dizziness, n/v, or fever/chills.    States pain has not been present today.  States she is really here to have a pregnancy test.  Has been trying to get pregnant for two yrs and Dr Nelda Marseille "is not helping me".  Wants fertility care and a new doctor.  Does not want me to call Dr Nelda Marseille.  Does request Phenergan for nausea.  Has  Mild headache, not typical of migraine.  Does not want any Tylenol or other meds for it. "I'll just take something at home".  Headache   This is a new problem. The current episode started today. The problem occurs intermittently. The problem has been waxing and waning. The pain is located in the frontal region. The pain does not radiate. The pain quality is similar to prior headaches. The quality of the pain is described as aching. The pain is at a severity of 2/10. The pain is mild. Associated symptoms include nausea. Pertinent negatives include no abdominal pain, back pain, blurred vision, fever, photophobia, seizures, sinus pressure, visual change or vomiting. Nothing aggravates the symptoms. She has tried nothing for the symptoms.   RN Note Pt states she is having some lower abdominal cramping and some spotting that started last week. Having some nausea that started last week and HA that started today. Takes Topamax for migraines. Pt states she has been trying to get pregnant.   Past Medical History: Past Medical History:  Diagnosis Date  . Adopted   . Anxiety   . Asthma   . Bipolar disorder ([place])   . Borderline  personality disorder   . Depression   . GERD (gastroesophageal reflux disease)   . Headache    Migraines  . PTSD (post-traumatic stress disorder)   . Suicide attempt [facility])     Past obstetric history: OB History  Gravida Para Term Preterm AB Living  1       1    SAB TAB Ectopic Multiple Live Births  1            # Outcome Date GA Lbr Len/2nd Weight Sex Delivery Anes PTL Lv  1 SAB               Past Surgical History: Past Surgical History:  Procedure Laterality Date  . ESOPHAGOGASTRODUODENOSCOPY  06/25/2012   Procedure: ESOPHAGOGASTRODUODENOSCOPY (EGD);  Surgeon: Arta Silence, MD;  Location: [facility];  Service: Endoscopy;  Laterality: N/A;  [name]/ebp  . GANGLION CYST EXCISION    . HYSTEROSCOPY N/A 10/20/2015   Procedure: HYSTEROSCOPY;  Surgeon: Janyth Pupa, DO;  Location: [facility];  Service: Gynecology;  Laterality: N/A;  . IUD REMOVAL N/A 10/20/2015   Procedure: INTRAUTERINE DEVICE (IUD) REMOVAL;  Surgeon: Janyth Pupa, DO;  Location: [facility];  Service: Gynecology;  Laterality: N/A;    Family History: Family History  Problem Relation Age of Onset  . Adopted: Yes    Social History: Social History  Substance Use Topics  . Smoking status: Light Tobacco Smoker    Packs/day: 0.10    Years: 0.20  Types: Cigarettes  . Smokeless tobacco: Never Used  . Alcohol use No    Allergies:  Allergies  Allergen Reactions  . Hydroxyzine Other (See Comments)    Headaches....fevers  . Icy Hot Rash    Meds:  Prescriptions Prior to Admission  Medication Sig Dispense Refill Last Dose  . amoxicillin (AMOXIL) 875 MG tablet Take 1 tablet (875 mg total) by mouth 2 (two) times daily. 14 tablet 0   . clonazePAM (KLONOPIN) 0.5 MG tablet Take 1 tablet (0.5 mg total) by mouth 3 (three) times daily as needed for anxiety. 60 tablet 5   . fluconazole (DIFLUCAN) 150 MG tablet Take 1 tablet (150 mg total) by mouth daily. 2 tablet 1   . prazosin (MINIPRESS) 2 MG capsule Take 2  capsules (4 mg total) by mouth at bedtime. 60 capsule 2   . sertraline (ZOLOFT) 100 MG tablet Take 50 mg by mouth daily. Take 1/2 tab daily 10 days, then 1/2 tab every other day   Taking  . topiramate (TOPAMAX) 100 MG tablet Take 1 tablet (100 mg total) by mouth 2 (two) times daily. 60 tablet 2   . ziprasidone (GEODON) 60 MG capsule Take 1 capsule (60 mg total) by mouth 2 (two) times daily with a meal. 60 capsule 2     I have reviewed patient's Past Medical Hx, Surgical Hx, Family Hx, Social Hx, medications and allergies.  ROS:  Review of Systems  Constitutional: Negative for fever.  HENT: Negative for sinus pressure.   Eyes: Negative for blurred vision and photophobia.  Gastrointestinal: Positive for nausea. Negative for abdominal pain and vomiting.  Musculoskeletal: Negative for back pain.  Neurological: Positive for headaches. Negative for seizures.   Other systems negative     Physical Exam  Patient Vitals for the past 24 hrs:  BP Temp Temp src Pulse Resp SpO2 Height Weight  02/14/17 0037 103/64 98.7 F (37.1 C) Oral 80 17 - - -  02/14/17 0013 113/72 98.4 F (36.9 C) Oral 90 16 99 % 5' 0.5" (1.537 m) 160 lb (72.6 kg)   Constitutional: Well-developed, well-nourished female in no acute distress.  Cardiovascular: normal rate and rhythm, no ectopy audible, S1 & S2 heard, no murmur Respiratory: normal effort, no distress. Lungs CTAB with no wheezes or crackles GI: Abd soft, non-tender.  Nondistended.  No rebound, No guarding.   MS: Extremities nontender, no edema, normal ROM Neurologic: Alert and oriented x 4.   Grossly nonfocal. GU: Neg CVAT. Skin:  Warm and Dry Psych:  Affect appropriate.  PELVIC EXAM: declined by patient   Labs: Results for orders placed or performed during the hospital encounter of 02/13/17 (from the past 24 hour(s))  Urinalysis, Routine w reflex microscopic     Status: Abnormal   Collection Time: 02/13/17 11:56 PM  Result Value Ref Range   Color,  Urine YELLOW YELLOW   APPearance CLOUDY (A) CLEAR   Specific Gravity, Urine 1.019 1.005 - 1.030   pH 8.0 5.0 - 8.0   Glucose, UA NEGATIVE NEGATIVE mg/dL   Hgb urine dipstick NEGATIVE NEGATIVE   Bilirubin Urine NEGATIVE NEGATIVE   Ketones, ur NEGATIVE NEGATIVE mg/dL   Protein, ur NEGATIVE NEGATIVE mg/dL   Nitrite NEGATIVE NEGATIVE   Leukocytes, UA NEGATIVE NEGATIVE  Pregnancy, urine POC     Status: None   Collection Time: 02/14/17 12:03 AM  Result Value Ref Range   Preg Test, Ur NEGATIVE NEGATIVE      Imaging:  No results found.  MAU  Course/MDM: I have ordered labs as follows: UA, and UPT which were both negative Imaging ordered: none indicated Results reviewed.  Offered evaluation and treatment for symptoms she presented with . States she is not really here for treatment but that she really just wanted a pregnancy test and to be evaluated for why she cannot get pregnant. Wants to leave.  Pt stable at time of discharge.  Assessment: Nausea and vomiting, intractability of vomiting not specified, unspecified vomiting type - Plan: Discharge patient  Other headache syndrome - Plan: Discharge patient    Plan: Discharge home Recommend Supportive care for symptoms Rx sent for Promethazine  for nausea Phone number for Dr Kerin Perna given Encouraged to follow up with Dr Nelda Marseille   Encouraged to return here or to other Urgent Care/ED if she develops worsening of symptoms, increase in pain, fever, or other concerning symptoms.   Hansel Feinstein CNM, MSN Certified Nurse-Midwife 02/14/2017 1:41 AM

## 2017-02-14 NOTE — Discharge Instructions (Signed)
Nausea, Adult Feeling sick to your stomach (nausea) means that your stomach is upset or you feel like you have to throw up (vomit). Feeling sick to your stomach is usually not serious, but it may be an early sign of a more serious medical problem. As you feel sicker to your stomach, it can lead to throwing up (vomiting). If you throw up, or if you are not able to drink enough fluids, there is a risk of dehydration. Dehydration can make you feel tired and thirsty, have a dry mouth, and pee (urinate) less often. Older adults and people who have other diseases or a weak defense (immune) system have a higher risk of dehydration. The main goal of treating this condition is to:  Limit how often you feel sick to your stomach.  Prevent throwing up and dehydration. Follow these instructions at home: Follow instructions from your doctor about how to care for yourself at home. Eating and drinking  Follow these recommendations as told by your doctor:  Take an oral rehydration solution (ORS). This is a drink that is sold at pharmacies and stores.  Drink clear fluids in small amounts as you are able, such as:  Water.  Ice chips.  Fruit juice that has water added (diluted fruit juice).  Low-calorie sports drinks.  Eat bland, easy to digest foods in small amounts as you are able, such as:  Bananas.  Applesauce.  Rice.  Lean meats.  Toast.  Crackers.  Avoid drinking fluids that contain a lot of sugar or caffeine.  Avoid alcohol.  Avoid spicy or fatty foods. General instructions   Drink enough fluid to keep your pee (urine) clear or pale yellow.  Wash your hands often. If you cannot use soap and water, use hand sanitizer.  Make sure that all people in your household wash their hands well and often.  Rest at home while you get better.  Take over-the-counter and prescription medicines only as told by your doctor.  Breathe slowly and deeply when you feel sick to your  stomach.  Watch your condition for any changes.  Keep all follow-up visits as told by your doctor. This is important. Contact a doctor if:  You have a headache.  You have new symptoms.  You feel sicker to your stomach.  You have a fever.  You feel light-headed or dizzy.  You throw up.  You are not able to keep fluids down. Get help right away if:  You have pain in your chest, neck, arm, or jaw.  You feel very weak or you pass out (faint).  You have throw up that is bright red or looks like coffee grounds.  You have bloody or black poop (stools), or poop that looks like tar.  You have a very bad headache, a stiff neck, or both.  You have very bad pain, cramping, or bloating in your belly.  You have a rash.  You have trouble breathing or you are breathing very quickly.  Your heart is beating very quickly.  Your skin feels cold and clammy.  You feel confused.  You have pain while peeing.  You have signs of dehydration, such as:  Dark pee, or very little or no pee.  Cracked lips.  Dry mouth.  Sunken eyes.  Sleepiness.  Weakness. These symptoms may be an emergency. Do not wait to see if the symptoms will go away. Get medical help right away. Call your local emergency services (911 in the [place] Do not drive yourself to  the hospital. This information is not intended to replace advice given to you by your health care provider. Make sure you discuss any questions you have with your health care provider. Document Released: 09/27/2011 Document Revised: 03/15/2016 Document Reviewed: 06/14/2015 Elsevier Interactive Patient Education  2017 [place] Headache Without Cause A headache is pain or discomfort felt around the head or neck area. The specific cause of a headache may not be found. There are many causes and types of headaches. A few common ones are:  Tension headaches.  Migraine headaches.  Cluster headaches.  Chronic daily  headaches. Follow these instructions at home: Watch your condition for any changes. Take these steps to help with your condition: Managing pain   Take over-the-counter and prescription medicines only as told by your health care provider.  Lie down in a dark, quiet room when you have a headache.  If directed, apply ice to the head and neck area:  Put ice in a plastic bag.  Place a towel between your skin and the bag.  Leave the ice on for 20 minutes, 2-3 times per day.  Use a heating pad or hot shower to apply heat to the head and neck area as told by your health care provider.  Keep lights dim if bright lights bother you or make your headaches worse. Eating and drinking   Eat meals on a regular schedule.  Limit alcohol use.  Decrease the amount of caffeine you drink, or stop drinking caffeine. General instructions   Keep all follow-up visits as told by your health care provider. This is important.  Keep a headache journal to help find out what may trigger your headaches. For example, write down:  What you eat and drink.  How much sleep you get.  Any change to your diet or medicines.  Try massage or other relaxation techniques.  Limit stress.  Sit up straight, and do not tense your muscles.  Do not use tobacco products, including cigarettes, chewing tobacco, or e-cigarettes. If you need help quitting, ask your health care provider.  Exercise regularly as told by your health care provider.  Sleep on a regular schedule. Get 7-9 hours of sleep, or the amount recommended by your health care provider. Contact a health care provider if:  Your symptoms are not helped by medicine.  You have a headache that is different from the usual headache.  You have nausea or you vomit.  You have a fever. Get help right away if:  Your headache becomes severe.  You have repeated vomiting.  You have a stiff neck.  You have a loss of vision.  You have problems with  speech.  You have pain in the eye or ear.  You have muscular weakness or loss of muscle control.  You lose your balance or have trouble walking.  You feel faint or pass out.  You have confusion. This information is not intended to replace advice given to you by your health care provider. Make sure you discuss any questions you have with your health care provider. Document Released: 10/08/2005 Document Revised: 03/15/2016 Document Reviewed: 01/31/2015 Elsevier Interactive Patient Education  2017 [employer].

## 2017-02-14 NOTE — MAU Note (Signed)
Pt states she is having some lower abdominal cramping and some spotting that started last week. Having some nausea that started last week and HA that started today. Takes Topamax for migraines. Pt states she has been trying to get pregnant.

## 2017-02-18 ENCOUNTER — Ambulatory Visit: Payer: BLUE CROSS/BLUE SHIELD | Admitting: Physician Assistant

## 2017-03-01 ENCOUNTER — Ambulatory Visit: Payer: BLUE CROSS/BLUE SHIELD | Admitting: Physician Assistant

## 2017-03-04 ENCOUNTER — Telehealth: Payer: Self-pay | Admitting: Physician Assistant

## 2017-03-04 ENCOUNTER — Encounter: Payer: Self-pay | Admitting: Physician Assistant

## 2017-03-15 ENCOUNTER — Ambulatory Visit (HOSPITAL_COMMUNITY)
Admission: EM | Admit: 2017-03-15 | Discharge: 2017-03-15 | Disposition: A | Discharge status: Home / Self Care | Payer: BLUE CROSS/BLUE SHIELD | Attending: Internal Medicine | Admitting: Internal Medicine

## 2017-03-15 ENCOUNTER — Encounter (HOSPITAL_COMMUNITY): Payer: Self-pay | Admitting: *Deleted

## 2017-03-15 ENCOUNTER — Ambulatory Visit (INDEPENDENT_AMBULATORY_CARE_PROVIDER_SITE_OTHER): Payer: BLUE CROSS/BLUE SHIELD

## 2017-03-15 DIAGNOSIS — M25562 Pain in left knee: Secondary | ICD-10-CM | POA: Diagnosis not present

## 2017-03-15 MED ORDER — DICLOFENAC SODIUM 75 MG PO TBEC: 75.0000 mg | DELAYED_RELEASE_TABLET | Freq: Two times a day (BID) | ORAL | 0 refills | Status: DC

## 2017-03-15 MED ORDER — IBUPROFEN 800 MG PO TABS
ORAL_TABLET | ORAL | Status: AC
  Filled 2017-03-15: qty 1

## 2017-03-15 MED ORDER — IBUPROFEN 800 MG PO TABS
800.0000 mg | ORAL_TABLET | Freq: Once | ORAL | Status: AC
  Administered 2017-03-15: 800 mg via ORAL

## 2017-03-15 NOTE — ED Triage Notes (Signed)
Patient states she jumped off a high step and she landed on left leg, leg knee buckled, and she felt a sharp pain down leg. Patient reports swelling to the knee with pain. Reports decreased ROM.

## 2017-03-15 NOTE — ED Provider Notes (Signed)
CSN: [id number]     Arrival date & time 03/15/17  1423 History   None    Chief Complaint  Patient presents with  . Knee Pain   (Consider location/radiation/quality/duration/timing/severity/associated sxs/prior Treatment) The history is provided by the patient. No language interpreter was used.  Knee Pain  Location:  Knee Injury: no   Knee location:  L knee Pain details:    Quality:  Aching   Radiates to:  Does not radiate   Severity:  Moderate   Onset quality:  Gradual   Timing:  Constant   Progression:  Worsening Chronicity:  New Dislocation: no   Prior injury to area:  No Relieved by:  Nothing Worsened by:  Nothing Ineffective treatments:  None tried Associated symptoms: decreased ROM and swelling   Pt twisted leg while jumping off a step  Past Medical History:  Diagnosis Date  . Adopted   . Anxiety   . Asthma   . Bipolar disorder ([place])   . Borderline personality disorder   . Depression   . GERD (gastroesophageal reflux disease)   . Headache    Migraines  . PTSD (post-traumatic stress disorder)   . Suicide attempt [facility])    Past Surgical History:  Procedure Laterality Date  . ESOPHAGOGASTRODUODENOSCOPY  06/25/2012   Procedure: ESOPHAGOGASTRODUODENOSCOPY (EGD);  Surgeon: Arta Silence, MD;  Location: [facility];  Service: Endoscopy;  Laterality: N/A;  [name]/ebp  . GANGLION CYST EXCISION    . HYSTEROSCOPY N/A 10/20/2015   Procedure: HYSTEROSCOPY;  Surgeon: Janyth Pupa, DO;  Location: [facility];  Service: Gynecology;  Laterality: N/A;  . IUD REMOVAL N/A 10/20/2015   Procedure: INTRAUTERINE DEVICE (IUD) REMOVAL;  Surgeon: Janyth Pupa, DO;  Location: [facility];  Service: Gynecology;  Laterality: N/A;   Family History  Problem Relation Age of Onset  . Adopted: Yes   Social History  Substance Use Topics  . Smoking status: Light Tobacco Smoker    Packs/day: 0.10    Years: 0.20    Types: Cigarettes  . Smokeless tobacco: Never Used  . Alcohol use No   OB  History    Gravida Para Term Preterm AB Living   1       1     SAB TAB Ectopic Multiple Live Births   1             Review of Systems  All other systems reviewed and are negative.   Allergies  Hydroxyzine and Icy hot  Home Medications   Prior to Admission medications   Medication Sig Start Date End Date Taking? Authorizing Provider  clonazePAM (KLONOPIN) 0.5 MG tablet Take 1 tablet (0.5 mg total) by mouth 3 (three) times daily as needed for anxiety. 01/14/17  Yes Terald Sleeper, PA-C  prazosin (MINIPRESS) 2 MG capsule Take 2 capsules (4 mg total) by mouth at bedtime. 01/14/17  Yes Terald Sleeper, PA-C  sertraline (ZOLOFT) 100 MG tablet Take 50 mg by mouth daily. Take 1/2 tab daily 10 days, then 1/2 tab every other day   Yes [provider]  topiramate (TOPAMAX) 100 MG tablet Take 1 tablet (100 mg total) by mouth 2 (two) times daily. 01/14/17  Yes Terald Sleeper, PA-C  ziprasidone (GEODON) 60 MG capsule Take 1 capsule (60 mg total) by mouth 2 (two) times daily with a meal. 01/14/17  Yes Terald Sleeper, PA-C  amoxicillin (AMOXIL) 875 MG tablet Take 1 tablet (875 mg total) by mouth 2 (two) times daily. 01/29/17   [place],  Orson Ape, FNP  fluconazole (DIFLUCAN) 150 MG tablet Take 1 tablet (150 mg total) by mouth daily. 01/29/17   Lysbeth Penner, FNP  promethazine (PHENERGAN) 25 MG tablet Take 1 tablet (25 mg total) by mouth every 6 (six) hours as needed for nausea or vomiting. 02/14/17   Seabron Spates, CNM   Meds Ordered and Administered this Visit   Medications  ibuprofen (ADVIL,MOTRIN) tablet 800 mg (800 mg Oral Given 03/15/17 1655)    LMP 02/24/2017  No data found.   Physical Exam  Constitutional: She is oriented to person, place, and time. She appears well-developed and well-nourished.  Musculoskeletal: She exhibits tenderness.  Swollen left knee, pain with range of motion,  nv and ns intact,  No instability  Neurological: She is alert and oriented to person, place,  and time.  Skin: Skin is warm.  Psychiatric: She has a normal mood and affect.  Nursing note and vitals reviewed.   Urgent Care Course     Procedures (including critical care time)  Labs Review Labs Reviewed - No data to display  Imaging Review Dg Knee Complete 4 Views Left  Result Date: 03/15/2017 CLINICAL DATA:  Injured knee.  Pain and swelling. EXAM: LEFT KNEE - COMPLETE 4+ VIEW COMPARISON:  None. FINDINGS: The joint spaces are maintained. No acute fracture or osteochondral lesion. No joint effusion. There is a mildly sclerotic appearing metaphyseal bone lesion. It is eccentrically located medially and could be a large healed fibrous cortical lesion. Somewhat coarsened trabeculae could suggest a hemangioma. Other fibroosseous lesion is possible. I do not see any worrisome or aggressive plain film features but I would recommend an MRI examination for further evaluation given the patient's young age. IMPRESSION: No acute bony findings or joint effusion. Medial metaphyseal lesion, likely benign but recommend MR for confirmation. Electronically Signed   By: Marijo Sanes M.D.   On: 03/15/2017 17:13     Visual Acuity Review  Right Eye Distance:   Left Eye Distance:   Bilateral Distance:    Right Eye Near:   Left Eye Near:    Bilateral Near:         MDM   1. Acute pain of left knee    An After Visit Summary was printed and given to the patient.  I personally performed the services in this documentation, which was scribed in my presence.  The recorded information has been reviewed and considered.   Ronnald Collum.    Fransico Meadow, [place] 03/15/17 1744

## 2017-03-15 NOTE — Discharge Instructions (Signed)
Schedule to see the Orthopaedist for evaluation.  Your xray shows an abnormality that needs further evaluation.

## 2017-04-12 ENCOUNTER — Encounter (HOSPITAL_COMMUNITY): Payer: Self-pay | Admitting: *Deleted

## 2017-04-12 ENCOUNTER — Inpatient Hospital Stay (HOSPITAL_COMMUNITY)
Admission: AD | Admit: 2017-04-12 | Discharge: 2017-04-12 | Discharge status: Against Medical Advice | Payer: BLUE CROSS/BLUE SHIELD | Source: Ambulatory Visit | Attending: Family Medicine | Admitting: Family Medicine

## 2017-04-12 DIAGNOSIS — Z5321 Procedure and treatment not carried out due to patient leaving prior to being seen by health care provider: Secondary | ICD-10-CM | POA: Diagnosis not present

## 2017-04-12 DIAGNOSIS — R109 Unspecified abdominal pain: Secondary | ICD-10-CM | POA: Insufficient documentation

## 2017-04-12 LAB — URINALYSIS, ROUTINE W REFLEX MICROSCOPIC
BILIRUBIN URINE: NEGATIVE
GLUCOSE, UA: NEGATIVE mg/dL
HGB URINE DIPSTICK: NEGATIVE
KETONES UR: NEGATIVE mg/dL
Leukocytes, UA: NEGATIVE
Nitrite: NEGATIVE
PROTEIN: NEGATIVE mg/dL
Specific Gravity, Urine: 1.01 (ref 1.005–1.030)
pH: 6 (ref 5.0–8.0)

## 2017-04-12 LAB — POCT PREGNANCY, URINE: Preg Test, Ur: NEGATIVE

## 2017-04-12 NOTE — MAU Note (Signed)
Pt states she has been feeling sick, took two HPT's - both were positive.  Is on medication for mental health, isn't sure if she should be taking meds.  Is having mild cramping, bled last week for 4 days, no bleeding today.

## 2017-04-12 NOTE — MAU Note (Signed)
Pt wants to leave, requesting results of UPT.  Informed UPT is negative, informed pt we can still see her.  Pt still wants to go, signed AMA form.

## 2017-07-04 ENCOUNTER — Emergency Department (HOSPITAL_COMMUNITY)
Admission: EM | Admit: 2017-07-04 | Discharge: 2017-07-04 | Disposition: A | Discharge status: Home / Self Care | Payer: BLUE CROSS/BLUE SHIELD | Attending: Emergency Medicine | Admitting: Emergency Medicine

## 2017-07-04 ENCOUNTER — Ambulatory Visit (INDEPENDENT_AMBULATORY_CARE_PROVIDER_SITE_OTHER): Payer: BLUE CROSS/BLUE SHIELD | Admitting: Physician Assistant

## 2017-07-04 ENCOUNTER — Emergency Department (HOSPITAL_COMMUNITY)
Admission: EM | Admit: 2017-07-04 | Discharge: 2017-07-04 | Discharge status: Absent without leave | Payer: BLUE CROSS/BLUE SHIELD

## 2017-07-04 ENCOUNTER — Encounter (HOSPITAL_COMMUNITY): Payer: Self-pay | Admitting: *Deleted

## 2017-07-04 ENCOUNTER — Encounter: Payer: Self-pay | Admitting: Physician Assistant

## 2017-07-04 VITALS — BP 105/76 | HR 69 | Temp 97.8°F | Ht 59.0 in | Wt 149.8 lb

## 2017-07-04 DIAGNOSIS — F4310 Post-traumatic stress disorder, unspecified: Secondary | ICD-10-CM

## 2017-07-04 DIAGNOSIS — N72 Inflammatory disease of cervix uteri: Secondary | ICD-10-CM

## 2017-07-04 DIAGNOSIS — F17210 Nicotine dependence, cigarettes, uncomplicated: Secondary | ICD-10-CM | POA: Diagnosis not present

## 2017-07-04 DIAGNOSIS — J45909 Unspecified asthma, uncomplicated: Secondary | ICD-10-CM | POA: Diagnosis not present

## 2017-07-04 DIAGNOSIS — Z79899 Other long term (current) drug therapy: Secondary | ICD-10-CM | POA: Diagnosis not present

## 2017-07-04 DIAGNOSIS — F39 Unspecified mood [affective] disorder: Secondary | ICD-10-CM

## 2017-07-04 DIAGNOSIS — F332 Major depressive disorder, recurrent severe without psychotic features: Secondary | ICD-10-CM | POA: Diagnosis not present

## 2017-07-04 DIAGNOSIS — R1030 Lower abdominal pain, unspecified: Secondary | ICD-10-CM

## 2017-07-04 LAB — URINALYSIS, ROUTINE W REFLEX MICROSCOPIC
Bilirubin Urine: NEGATIVE
GLUCOSE, UA: NEGATIVE mg/dL
HGB URINE DIPSTICK: NEGATIVE
Ketones, ur: NEGATIVE mg/dL
NITRITE: NEGATIVE
Protein, ur: NEGATIVE mg/dL
Specific Gravity, Urine: 1.015 (ref 1.005–1.030)
pH: 6 (ref 5.0–8.0)

## 2017-07-04 LAB — COMPREHENSIVE METABOLIC PANEL
ALK PHOS: 49 U/L (ref 38–126)
ALT: 15 U/L (ref 14–54)
ANION GAP: 6 (ref 5–15)
AST: 20 U/L (ref 15–41)
Albumin: 3.9 g/dL (ref 3.5–5.0)
BILIRUBIN TOTAL: 0.7 mg/dL (ref 0.3–1.2)
BUN: 11 mg/dL (ref 6–20)
CALCIUM: 9.3 mg/dL (ref 8.9–10.3)
CO2: 19 mmol/L — ABNORMAL LOW (ref 22–32)
Chloride: 110 mmol/L (ref 101–111)
Creatinine, Ser: 0.68 mg/dL (ref 0.44–1.00)
Glucose, Bld: 95 mg/dL (ref 65–99)
Potassium: 3.3 mmol/L — ABNORMAL LOW (ref 3.5–5.1)
Sodium: 135 mmol/L (ref 135–145)
TOTAL PROTEIN: 7.1 g/dL (ref 6.5–8.1)

## 2017-07-04 LAB — CBC
HCT: 43.1 % (ref 36.0–46.0)
HEMOGLOBIN: 14.7 g/dL (ref 12.0–15.0)
MCH: 29.9 pg (ref 26.0–34.0)
MCHC: 34.1 g/dL (ref 30.0–36.0)
MCV: 87.8 fL (ref 78.0–100.0)
Platelets: 252 10*3/uL (ref 150–400)
RBC: 4.91 MIL/uL (ref 3.87–5.11)
RDW: 14.1 % (ref 11.5–15.5)
WBC: 9.5 10*3/uL (ref 4.0–10.5)

## 2017-07-04 LAB — RPR: RPR: NONREACTIVE

## 2017-07-04 LAB — WET PREP, GENITAL
Sperm: NONE SEEN
Trich, Wet Prep: NONE SEEN
Yeast Wet Prep HPF POC: NONE SEEN

## 2017-07-04 LAB — RAPID HIV SCREEN (HIV 1/2 AB+AG)
HIV 1/2 Antibodies: NONREACTIVE
HIV-1 P24 Antigen - HIV24: NONREACTIVE

## 2017-07-04 LAB — GC/CHLAMYDIA PROBE AMP (~~LOC~~) NOT AT ARMC
CHLAMYDIA, DNA PROBE: NEGATIVE
NEISSERIA GONORRHEA: NEGATIVE

## 2017-07-04 LAB — HCG, QUANTITATIVE, PREGNANCY

## 2017-07-04 MED ORDER — TOPIRAMATE 100 MG PO TABS: 100.0000 mg | ORAL_TABLET | Freq: Two times a day (BID) | ORAL | 5 refills | Status: DC

## 2017-07-04 MED ORDER — ZIPRASIDONE HCL 60 MG PO CAPS: 60.0000 mg | ORAL_CAPSULE | Freq: Two times a day (BID) | ORAL | 5 refills | Status: DC

## 2017-07-04 MED ORDER — DICLOFENAC SODIUM 75 MG PO TBEC: 75.0000 mg | DELAYED_RELEASE_TABLET | Freq: Two times a day (BID) | ORAL | 2 refills | Status: DC

## 2017-07-04 MED ORDER — AZITHROMYCIN 250 MG PO TABS
1000.0000 mg | ORAL_TABLET | Freq: Once | ORAL | Status: AC
  Administered 2017-07-04: 1000 mg via ORAL
  Filled 2017-07-04: qty 4

## 2017-07-04 MED ORDER — CEFTRIAXONE SODIUM 250 MG IJ SOLR
250.0000 mg | Freq: Once | INTRAMUSCULAR | Status: AC
  Administered 2017-07-04: 250 mg via INTRAMUSCULAR
  Filled 2017-07-04: qty 250

## 2017-07-04 MED ORDER — CLONAZEPAM 0.5 MG PO TABS: 0.5000 mg | ORAL_TABLET | Freq: Three times a day (TID) | ORAL | 5 refills | Status: DC | PRN

## 2017-07-04 MED ORDER — PRAZOSIN HCL 2 MG PO CAPS: 4.0000 mg | ORAL_CAPSULE | Freq: Every day | ORAL | 5 refills | Status: DC

## 2017-07-04 MED ORDER — SERTRALINE HCL 100 MG PO TABS: 100.0000 mg | ORAL_TABLET | Freq: Every day | ORAL | 5 refills | Status: DC

## 2017-07-04 NOTE — Discharge Instructions (Signed)
You will be notified if you are tested for any specific infection that can be treated.  Follow up with your doctor for further evaluation and refill of your medication.  Take Gas-X at home as needed for bloating.  Take tylenol or ibuprofen as needed for pain.

## 2017-07-04 NOTE — Progress Notes (Signed)
BP 105/76   Pulse 69   Temp 97.8 F (36.6 C) (Oral)   Ht 4\' 11"  (1.499 m)   Wt 149 lb 12.8 oz (67.9 kg)   LMP 06/17/2017   BMI 30.26 kg/m    Subjective:    Patient ID: Kelly Barry, female    DOB: 1991/07/04, 26 y.o.   MRN: [id number]  HPI: Kelly Barry is a 26 y.o. female presenting on 07/04/2017 for Medication Refill  This patient comes in for periodic recheck on medications and conditions including mood disorder, PTSD, depression.  She has been out of medication for some time. She became quite manic. Her husband her future husband is here with her. They are trying to get her medications regulated. She is to get started back and plan to see [place] every 3 months.   All medications are reviewed today. There are no reports of any problems with the medications. All of the medical conditions are reviewed and updated.  Lab work is reviewed and will be ordered as medically necessary. There are no new problems reported with today's visit.   Relevant past medical, surgical, family and social history reviewed and updated as indicated. Allergies and medications reviewed and updated.  Past Medical History:  Diagnosis Date  . Adopted   . Anxiety   . Asthma   . Bipolar disorder ([place])   . Borderline personality disorder   . Depression   . GERD (gastroesophageal reflux disease)   . Headache    Migraines  . PTSD (post-traumatic stress disorder)   . Suicide attempt [facility]     Past Surgical History:  Procedure Laterality Date  . ESOPHAGOGASTRODUODENOSCOPY  06/25/2012   Procedure: ESOPHAGOGASTRODUODENOSCOPY (EGD);  Surgeon: Arta Silence, MD;  Location: [facility];  Service: Endoscopy;  Laterality: N/A;  [name]/ebp  . GANGLION CYST EXCISION    . HYSTEROSCOPY N/A 10/20/2015   Procedure: HYSTEROSCOPY;  Surgeon: Janyth Pupa, DO;  Location: [facility];  Service: Gynecology;  Laterality: N/A;  . IUD REMOVAL N/A 10/20/2015   Procedure: INTRAUTERINE DEVICE (IUD) REMOVAL;  Surgeon: Janyth Pupa, DO;  Location: [facility];  Service: Gynecology;  Laterality: N/A;    Review of Systems  Constitutional: Negative.  Negative for activity change, fatigue and fever.  HENT: Negative.   Eyes: Negative.   Respiratory: Negative.  Negative for cough.   Cardiovascular: Negative.  Negative for chest pain.  Gastrointestinal: Negative.  Negative for abdominal pain.  Endocrine: Negative.   Genitourinary: Negative.  Negative for dysuria.  Musculoskeletal: Negative.   Skin: Negative.   Neurological: Negative.   Psychiatric/Behavioral: Positive for agitation, behavioral problems and dysphoric mood. Negative for suicidal ideas. The patient is nervous/anxious.     Allergies as of 07/04/2017      Reactions   Hydroxyzine Other (See Comments)   Headaches....fevers   Icy Hot Rash      Medication List       Accurate as of 07/04/17  3:27 PM. Always use your most recent med list.          clonazePAM 0.5 MG tablet Commonly known as:  KLONOPIN Take 1 tablet (0.5 mg total) by mouth 3 (three) times daily as needed for anxiety.   diclofenac 75 MG EC tablet Commonly known as:  VOLTAREN Take 1 tablet (75 mg total) by mouth 2 (two) times daily.   prazosin 2 MG capsule Commonly known as:  MINIPRESS Take 2 capsules (4 mg total) by mouth at bedtime.   sertraline 100 MG tablet Commonly  known as:  ZOLOFT Take 1 tablet (100 mg total) by mouth daily.   topiramate 100 MG tablet Commonly known as:  TOPAMAX Take 1 tablet (100 mg total) by mouth 2 (two) times daily.   ziprasidone 60 MG capsule Commonly known as:  GEODON Take 1 capsule (60 mg total) by mouth 2 (two) times daily with a meal.            Discharge Care Instructions        Start     Ordered   07/04/17 0000  sertraline (ZOLOFT) 100 MG tablet  Daily    Question:  Supervising Provider  Answer:  Timmothy Euler   07/04/17 1409   07/04/17 0000  topiramate (TOPAMAX) 100 MG tablet  2 times daily    Question:  Supervising  Provider  Answer:  Timmothy Euler   07/04/17 1409   07/04/17 0000  ziprasidone (GEODON) 60 MG capsule  2 times daily with meals    Question:  Supervising Provider  Answer:  Timmothy Euler   07/04/17 1409   07/04/17 0000  clonazePAM (KLONOPIN) 0.5 MG tablet  3 times daily PRN    Question:  Supervising Provider  Answer:  Timmothy Euler   07/04/17 1409   07/04/17 0000  prazosin (MINIPRESS) 2 MG capsule  Daily at bedtime    Question:  Supervising Provider  Answer:  Kenn File L   07/04/17 1409   07/04/17 0000  diclofenac (VOLTAREN) 75 MG EC tablet  2 times daily    Question:  Supervising Provider  Answer:  Timmothy Euler   07/04/17 1409         Objective:    BP 105/76   Pulse 69   Temp 97.8 F (36.6 C) (Oral)   Ht 4\' 11"  (1.499 m)   Wt 149 lb 12.8 oz (67.9 kg)   LMP 06/17/2017   BMI 30.26 kg/m   Allergies  Allergen Reactions  . Hydroxyzine Other (See Comments)    Headaches....fevers  . Icy Hot Rash    Physical Exam  Constitutional: She is oriented to person, place, and time. She appears well-developed and well-nourished.  HENT:  Head: Normocephalic and atraumatic.  Eyes: Pupils are equal, round, and reactive to light. Conjunctivae and EOM are normal.  Cardiovascular: Normal rate, regular rhythm, normal heart sounds and intact distal pulses.   Pulmonary/Chest: Effort normal and breath sounds normal.  Abdominal: Soft. Bowel sounds are normal.  Neurological: She is alert and oriented to person, place, and time. She has normal reflexes.  Skin: Skin is warm and dry. No rash noted.  Psychiatric: Judgment and thought content normal. Her mood appears anxious. Her speech is rapid and/or pressured. She is agitated. Cognition and memory are normal.        Assessment & Plan:   1. Severe episode of recurrent major depressive disorder, without psychotic features ([place]) - sertraline (ZOLOFT) 100 MG tablet; Take 1 tablet (100 mg total) by mouth daily.  Dispense:  30 tablet; Refill: 5 - topiramate (TOPAMAX) 100 MG tablet; Take 1 tablet (100 mg total) by mouth 2 (two) times daily.  Dispense: 60 tablet; Refill: 5 - ziprasidone (GEODON) 60 MG capsule; Take 1 capsule (60 mg total) by mouth 2 (two) times daily with a meal.  Dispense: 60 capsule; Refill: 5 - prazosin (MINIPRESS) 2 MG capsule; Take 2 capsules (4 mg total) by mouth at bedtime.  Dispense: 60 capsule; Refill: 5  2. PTSD (post-traumatic stress disorder) - clonazePAM (KLONOPIN)  0.5 MG tablet; Take 1 tablet (0.5 mg total) by mouth 3 (three) times daily as needed for anxiety.  Dispense: 60 tablet; Refill: 5  3. Mood disorder ([facility]) - sertraline (ZOLOFT) 100 MG tablet; Take 1 tablet (100 mg total) by mouth daily.  Dispense: 30 tablet; Refill: 5 - topiramate (TOPAMAX) 100 MG tablet; Take 1 tablet (100 mg total) by mouth 2 (two) times daily.  Dispense: 60 tablet; Refill: 5 - ziprasidone (GEODON) 60 MG capsule; Take 1 capsule (60 mg total) by mouth 2 (two) times daily with a meal.  Dispense: 60 capsule; Refill: 5    Current Outpatient Prescriptions:  .  clonazePAM (KLONOPIN) 0.5 MG tablet, Take 1 tablet (0.5 mg total) by mouth 3 (three) times daily as needed for anxiety., Disp: 60 tablet, Rfl: 5 .  diclofenac (VOLTAREN) 75 MG EC tablet, Take 1 tablet (75 mg total) by mouth 2 (two) times daily., Disp: 60 tablet, Rfl: 2 .  prazosin (MINIPRESS) 2 MG capsule, Take 2 capsules (4 mg total) by mouth at bedtime., Disp: 60 capsule, Rfl: 5 .  sertraline (ZOLOFT) 100 MG tablet, Take 1 tablet (100 mg total) by mouth daily., Disp: 30 tablet, Rfl: 5 .  topiramate (TOPAMAX) 100 MG tablet, Take 1 tablet (100 mg total) by mouth 2 (two) times daily., Disp: 60 tablet, Rfl: 5 .  ziprasidone (GEODON) 60 MG capsule, Take 1 capsule (60 mg total) by mouth 2 (two) times daily with a meal., Disp: 60 capsule, Rfl: 5 Continue all other maintenance medications as listed above.  Follow up plan: Return if symptoms worsen or fail to  improve.  Educational handout given for [place] PA-C [facility] [address]  [place], [place] [address] [phone]   07/04/2017, 3:27 PM

## 2017-07-04 NOTE — ED Provider Notes (Signed)
[facility] DEPT Provider Note   CSN: [id number] Arrival date & time: 07/04/17  0545     History   Chief Complaint Chief Complaint  Patient presents with  . Emesis  . Abdominal Pain    HPI Kelly Barry is a 26 y.o. female.  HPI   26 year old female with history of depression and suicidal ideation presenting for evaluation of abdominal pain. Patient states for the past 2-3 days she has had persistent abdominal bloatedness. Furthermore, she also report having breast sensitivity, feeling nauseous, has vomited several times of nonbloody nonbilious content. She has normal bowel movement last night but did notice trace of bright red blood when she wipes. Her primary discomfort is the abdominal bloatedness. Her last menstrual period was 2 weeks ago.  Patient states she is actively trying to get pregnant. She also report a running out of her psychiatric medication which has caused her increased anxiety. She is scheduled for a follow-up appointment today but afraid that she may not be able to make it to the appointment. She denies any fever, severe headache, chest pain, terms of breath, productive cough, dysuria, hematuria, vaginal bleeding or vaginal discharge.  Past Medical History:  Diagnosis Date  . Adopted   . Anxiety   . Asthma   . Bipolar disorder ([place])   . Borderline personality disorder   . Depression   . GERD (gastroesophageal reflux disease)   . Headache    Migraines  . PTSD (post-traumatic stress disorder)   . Suicide attempt [facility])     Patient Active Problem List   Diagnosis Date Noted  . PTSD (post-traumatic stress disorder) 01/14/2017  . Suicidal ideations 03/28/2016  . MDD (major depressive disorder), recurrent episode, severe ([place]) 03/27/2016    Past Surgical History:  Procedure Laterality Date  . ESOPHAGOGASTRODUODENOSCOPY  06/25/2012   Procedure: ESOPHAGOGASTRODUODENOSCOPY (EGD);  Surgeon: Arta Silence, MD;  Location: [facility];  Service: Endoscopy;   Laterality: N/A;  [name]/ebp  . GANGLION CYST EXCISION    . HYSTEROSCOPY N/A 10/20/2015   Procedure: HYSTEROSCOPY;  Surgeon: Janyth Pupa, DO;  Location: [facility];  Service: Gynecology;  Laterality: N/A;  . IUD REMOVAL N/A 10/20/2015   Procedure: INTRAUTERINE DEVICE (IUD) REMOVAL;  Surgeon: Janyth Pupa, DO;  Location: [facility];  Service: Gynecology;  Laterality: N/A;    OB History    Gravida Para Term Preterm AB Living   1       1     SAB TAB Ectopic Multiple Live Births   1               Home Medications    Prior to Admission medications   Medication Sig Start Date End Date Taking? Authorizing Provider  amoxicillin (AMOXIL) 875 MG tablet Take 1 tablet (875 mg total) by mouth 2 (two) times daily. 01/29/17   Lysbeth Penner, FNP  clonazePAM (KLONOPIN) 0.5 MG tablet Take 1 tablet (0.5 mg total) by mouth 3 (three) times daily as needed for anxiety. 01/14/17   Terald Sleeper, PA-C  diclofenac (VOLTAREN) 75 MG EC tablet Take 1 tablet (75 mg total) by mouth 2 (two) times daily. 03/15/17   Fransico Meadow, PA-C  fluconazole (DIFLUCAN) 150 MG tablet Take 1 tablet (150 mg total) by mouth daily. 01/29/17   Lysbeth Penner, FNP  prazosin (MINIPRESS) 2 MG capsule Take 2 capsules (4 mg total) by mouth at bedtime. 01/14/17   Terald Sleeper, PA-C  promethazine (PHENERGAN) 25 MG tablet Take 1 tablet (25 mg  total) by mouth every 6 (six) hours as needed for nausea or vomiting. 02/14/17   Seabron Spates, CNM  sertraline (ZOLOFT) 100 MG tablet Take 50 mg by mouth daily. Take 1/2 tab daily 10 days, then 1/2 tab every other day    [provider]  topiramate (TOPAMAX) 100 MG tablet Take 1 tablet (100 mg total) by mouth 2 (two) times daily. 01/14/17   Terald Sleeper, PA-C  ziprasidone (GEODON) 60 MG capsule Take 1 capsule (60 mg total) by mouth 2 (two) times daily with a meal. 01/14/17   Terald Sleeper, PA-C    Family History Family History  Problem Relation Age of Onset  . Adopted: Yes     Social History Social History  Substance Use Topics  . Smoking status: Light Tobacco Smoker    Packs/day: 0.10    Years: 0.20    Types: Cigarettes  . Smokeless tobacco: Never Used  . Alcohol use No     Allergies   Hydroxyzine and Icy hot   Review of Systems Review of Systems  All other systems reviewed and are negative.    Physical Exam Updated Vital Signs BP 118/74   Pulse 83   Temp 98.5 F (36.9 C)   Resp 16   LMP 06/17/2017   SpO2 100%   Physical Exam  Constitutional: She appears well-developed and well-nourished. No distress.  HENT:  Head: Atraumatic.  Eyes: Conjunctivae are normal.  Neck: Neck supple.  Cardiovascular: Normal rate and regular rhythm.   Pulmonary/Chest: Effort normal and breath sounds normal.  Abdominal: Soft. Bowel sounds are normal. She exhibits no distension. There is tenderness (Tenderness to suprapubic region without guarding or rebound tenderness).  Genitourinary:  Genitourinary Comments: Chaperone present during exam. No inguinal lymphadenopathy or inguinal hernia noted. Normal external genitalia. Normal vaginal vault free of rash or discharge. Close cervical os with some dystrophic mucosal changes at the T-zone. On bimanual examination,mild left adnexal tenderness with cervical motion tenderness.  Neurological: She is alert.  Skin: No rash noted.  Psychiatric: She has a normal mood and affect.  Nursing note and vitals reviewed.    ED Treatments / Results  Labs (all labs ordered are listed, but only abnormal results are displayed) Labs Reviewed  COMPREHENSIVE METABOLIC PANEL  CBC  URINALYSIS, ROUTINE W REFLEX MICROSCOPIC  POC URINE PREG, ED    EKG  EKG Interpretation None       Radiology No results found.  Procedures Procedures (including critical care time)  Medications Ordered in ED Medications - No data to display   Initial Impression / Assessment and Plan / ED Course  I have reviewed the triage vital  signs and the nursing notes.  Pertinent labs & imaging results that were available during my care of the patient were reviewed by me and considered in my medical decision making (see chart for details).     BP 97/72   Pulse 70   Temp 98.5 F (36.9 C)   Resp 16   LMP 06/17/2017   SpO2 99%    Final Clinical Impressions(s) / ED Diagnoses   Final diagnoses:  Cervicitis  Lower abdominal pain    New Prescriptions New Prescriptions   No medications on file   6:47 AM Patient with multiple complaints. She is actually trying to get pregnant and currently complaining of abdominal bloatedness, breast tenderness. She does have some mild superpubic tenderness on exam, she warranted a pelvic examination.  Patient also report of running out of her  psychiatric medication, however she does have a appointment later on today. Therefore, I will defer medication prescription refill.   8:01 AM Blood pressure with evidence of clue cells and many WBC. She does have cervical motion tenderness on exam therefore she'll be treated for PID. Her urinalysis shows no signs of urinary tract infection. Her electrolyte panel are reassuring, normal WBC, pregnancy test is negative.  Pt will be given rocephin/zithromax.  Her sxs not consistent with BV, therefore no treatment necessary.  Mild hypokalemia, recommend eating leafy vegetable. Pt will f/u with PCP today for further evaluation and medication refill for psych related complaints.  Return precaution given.     Domenic Moras, PA-C 07/04/17 Marijo Conception    Ripley Fraise, MD 07/04/17 [phone]

## 2017-07-04 NOTE — Patient Instructions (Signed)
In a few days you may receive a survey in the mail or online from [employer] regarding your visit with us today. Please take a moment to fill this out. Your feedback is very important to our whole office. It can help us better understand your needs as well as improve your experience and satisfaction. Thank you for taking your time to complete it. We care about you.  Zyara Riling, PA-C  

## 2017-07-04 NOTE — ED Triage Notes (Addendum)
Pt c/o NV with lower abdominal pain x 2 days. LMP two weeks ago; denies vaginal bleeding or discharge.   Pt also requesting medication refill for klonopin and sertraline

## 2017-09-19 ENCOUNTER — Other Ambulatory Visit: Payer: Self-pay | Admitting: Family Medicine

## 2017-10-04 ENCOUNTER — Encounter: Payer: Self-pay | Admitting: Physician Assistant

## 2017-10-04 ENCOUNTER — Ambulatory Visit (INDEPENDENT_AMBULATORY_CARE_PROVIDER_SITE_OTHER): Payer: BLUE CROSS/BLUE SHIELD | Admitting: Physician Assistant

## 2017-10-04 DIAGNOSIS — F39 Unspecified mood [affective] disorder: Secondary | ICD-10-CM

## 2017-10-04 DIAGNOSIS — F332 Major depressive disorder, recurrent severe without psychotic features: Secondary | ICD-10-CM

## 2017-10-04 MED ORDER — ZIPRASIDONE HCL 20 MG PO CAPS: 20.0000 mg | ORAL_CAPSULE | Freq: Two times a day (BID) | ORAL | 0 refills | Status: DC

## 2017-10-04 NOTE — Patient Instructions (Addendum)
Topamax:  Take topamax once daily 1 week, then 1/2 tab daily 1 week, then 1/2 tab every other day one week.   NEXT Geodon 60 mg  1 tablet daily 1 week, Take 20 mg once daily one week, then 20 mg every other day 1 week.   Discuss Sleep options with OB/GYN

## 2017-10-06 NOTE — Progress Notes (Signed)
BP 103/71   Pulse 72   Temp 98.7 F (37.1 C) (Oral)   Ht 4\' 11"  (1.499 m)   Wt 160 lb 12.8 oz (72.9 kg)   BMI 32.48 kg/m    Subjective:    Patient ID: Kelly Barry, female    DOB: 05/13/1991, 26 y.o.   MRN: [id number]  HPI: Kelly Barry is a 26 y.o. female presenting on 10/04/2017 for Follow-up (3 month )  Needs to taper off medications due to wanting to get pregnant. We discussed doing one a a time. Starting with topamax and then next geodon.  She knows that if anything gets bad to call the office.  Will recheck in 4 weeks to determine the next step.  She goes to GYN in a couple more weeks. Will discuss antidepressant and sleep options.  Relevant past medical, surgical, family and social history reviewed and updated as indicated. Allergies and medications reviewed and updated.  Past Medical History:  Diagnosis Date  . Adopted   . Anxiety   . Asthma   . Bipolar disorder ([place])   . Borderline personality disorder ([place])   . Depression   . GERD (gastroesophageal reflux disease)   . Headache    Migraines  . PTSD (post-traumatic stress disorder)   . Suicide attempt [facility])     Past Surgical History:  Procedure Laterality Date  . ESOPHAGOGASTRODUODENOSCOPY  06/25/2012   Procedure: ESOPHAGOGASTRODUODENOSCOPY (EGD);  Surgeon: Arta Silence, MD;  Location: [facility];  Service: Endoscopy;  Laterality: N/A;  [name]/ebp  . GANGLION CYST EXCISION    . HYSTEROSCOPY N/A 10/20/2015   Procedure: HYSTEROSCOPY;  Surgeon: Janyth Pupa, DO;  Location: [facility];  Service: Gynecology;  Laterality: N/A;  . IUD REMOVAL N/A 10/20/2015   Procedure: INTRAUTERINE DEVICE (IUD) REMOVAL;  Surgeon: Janyth Pupa, DO;  Location: [facility];  Service: Gynecology;  Laterality: N/A;    Review of Systems  Constitutional: Negative.  Negative for activity change, fatigue and fever.  HENT: Negative.   Eyes: Negative.   Respiratory: Negative.  Negative for cough.   Cardiovascular: Negative.  Negative for  chest pain.  Gastrointestinal: Negative.  Negative for abdominal pain.  Endocrine: Negative.   Genitourinary: Negative.  Negative for dysuria.  Musculoskeletal: Negative.   Skin: Negative.   Neurological: Negative.     Allergies as of 10/04/2017      Reactions   Diclofenac Anaphylaxis, Rash   Hydroxyzine Other (See Comments)   Headaches....fevers   Icy Hot Rash      Medication List        Accurate as of 10/04/17 11:59 PM. Always use your most recent med list.          clonazePAM 0.5 MG tablet Commonly known as:  KLONOPIN Take 1 tablet (0.5 mg total) by mouth 3 (three) times daily as needed for anxiety.   prazosin 2 MG capsule Commonly known as:  MINIPRESS Take 2 capsules (4 mg total) by mouth at bedtime.   sertraline 100 MG tablet Commonly known as:  ZOLOFT Take 1 tablet (100 mg total) by mouth daily.   ziprasidone 20 MG capsule Commonly known as:  GEODON Take 1 capsule (20 mg total) by mouth 2 (two) times daily with a meal.          Objective:    BP 103/71   Pulse 72   Temp 98.7 F (37.1 C) (Oral)   Ht 4\' 11"  (1.499 m)   Wt 160 lb 12.8 oz (72.9 kg)  BMI 32.48 kg/m   Allergies  Allergen Reactions  . Diclofenac Anaphylaxis and Rash  . Hydroxyzine Other (See Comments)    Headaches....fevers  . Icy Hot Rash    Physical Exam  Constitutional: She is oriented to person, place, and time. She appears well-developed and well-nourished.  HENT:  Head: Normocephalic and atraumatic.  Right Ear: Tympanic membrane, external ear and ear canal normal.  Left Ear: Tympanic membrane, external ear and ear canal normal.  Nose: Nose normal. No rhinorrhea.  Mouth/Throat: Oropharynx is clear and moist and mucous membranes are normal. No oropharyngeal exudate or posterior oropharyngeal erythema.  Eyes: Conjunctivae and EOM are normal. Pupils are equal, round, and reactive to light.  Neck: Normal range of motion. Neck supple.  Cardiovascular: Normal rate, regular  rhythm, normal heart sounds and intact distal pulses.  Pulmonary/Chest: Effort normal and breath sounds normal.  Abdominal: Soft. Bowel sounds are normal.  Neurological: She is alert and oriented to person, place, and time. She has normal reflexes.  Skin: Skin is warm and dry. No rash noted.  Psychiatric: She has a normal mood and affect. Her behavior is normal. Judgment and thought content normal.    Results for orders placed or performed during the hospital encounter of 07/04/17  Wet prep, genital  Result Value Ref Range   Yeast Wet Prep HPF POC NONE SEEN NONE SEEN   Trich, Wet Prep NONE SEEN NONE SEEN   Clue Cells Wet Prep HPF POC PRESENT (A) NONE SEEN   WBC, Wet Prep HPF POC MANY (A) NONE SEEN   Sperm NONE SEEN   Comprehensive metabolic panel  Result Value Ref Range   Sodium 135 135 - 145 mmol/L   Potassium 3.3 (L) 3.5 - 5.1 mmol/L   Chloride 110 101 - 111 mmol/L   CO2 19 (L) 22 - 32 mmol/L   Glucose, Bld 95 65 - 99 mg/dL   BUN 11 6 - 20 mg/dL   Creatinine, Ser 0.68 0.44 - 1.00 mg/dL   Calcium 9.3 8.9 - 10.3 mg/dL   Total Protein 7.1 6.5 - 8.1 g/dL   Albumin 3.9 3.5 - 5.0 g/dL   AST 20 15 - 41 U/L   ALT 15 14 - 54 U/L   Alkaline Phosphatase 49 38 - 126 U/L   Total Bilirubin 0.7 0.3 - 1.2 mg/dL   GFR calc non Af Amer >60 >60 mL/min   GFR calc Af Amer >60 >60 mL/min   Anion gap 6 5 - 15  CBC  Result Value Ref Range   WBC 9.5 4.0 - 10.5 K/uL   RBC 4.91 3.87 - 5.11 MIL/uL   Hemoglobin 14.7 12.0 - 15.0 g/dL   HCT 43.1 36.0 - 46.0 %   MCV 87.8 78.0 - 100.0 fL   MCH 29.9 26.0 - 34.0 pg   MCHC 34.1 30.0 - 36.0 g/dL   RDW 14.1 11.5 - 15.5 %   Platelets 252 150 - 400 K/uL  Urinalysis, Routine w reflex microscopic  Result Value Ref Range   Color, Urine YELLOW YELLOW   APPearance CLEAR CLEAR   Specific Gravity, Urine 1.015 1.005 - 1.030   pH 6.0 5.0 - 8.0   Glucose, UA NEGATIVE NEGATIVE mg/dL   Hgb urine dipstick NEGATIVE NEGATIVE   Bilirubin Urine NEGATIVE NEGATIVE    Ketones, ur NEGATIVE NEGATIVE mg/dL   Protein, ur NEGATIVE NEGATIVE mg/dL   Nitrite NEGATIVE NEGATIVE   Leukocytes, UA TRACE (A) NEGATIVE   RBC / HPF 0-5 0 -  5 RBC/hpf   WBC, UA 0-5 0 - 5 WBC/hpf   Bacteria, UA RARE (A) NONE SEEN   Squamous Epithelial / LPF 0-5 (A) NONE SEEN   Mucus PRESENT   Rapid HIV screen (HIV 1/2 Ab+Ag)  Result Value Ref Range   HIV-1 P24 Antigen - HIV24 NON REACTIVE NON REACTIVE   HIV 1/2 Antibodies NON REACTIVE NON REACTIVE   Interpretation (HIV Ag Ab)      A non reactive test result means that HIV 1 or HIV 2 antibodies and HIV 1 p24 antigen were not detected in the specimen.  RPR  Result Value Ref Range   RPR Ser Ql Non Reactive Non Reactive  hCG, quantitative, pregnancy  Result Value Ref Range   hCG, Beta Chain, Quant, S <1 <5 mIU/mL  GC/Chlamydia probe amp ([place])not at [facility]  Result Value Ref Range   Chlamydia Negative    Neisseria gonorrhea Negative       Assessment & Plan:   1. Severe episode of recurrent major depressive disorder, without psychotic features ([place]) - ziprasidone (GEODON) 20 MG capsule; Take 1 capsule (20 mg total) by mouth 2 (two) times daily with a meal.  Dispense: 60 capsule; Refill: 0  2. Mood disorder ([facility]) - ziprasidone (GEODON) 20 MG capsule; Take 1 capsule (20 mg total) by mouth 2 (two) times daily with a meal.  Dispense: 60 capsule; Refill: 0    Current Outpatient Medications:  .  clonazePAM (KLONOPIN) 0.5 MG tablet, Take 1 tablet (0.5 mg total) by mouth 3 (three) times daily as needed for anxiety., Disp: 60 tablet, Rfl: 5 .  prazosin (MINIPRESS) 2 MG capsule, Take 2 capsules (4 mg total) by mouth at bedtime., Disp: 60 capsule, Rfl: 5 .  sertraline (ZOLOFT) 100 MG tablet, Take 1 tablet (100 mg total) by mouth daily., Disp: 30 tablet, Rfl: 5 .  ziprasidone (GEODON) 20 MG capsule, Take 1 capsule (20 mg total) by mouth 2 (two) times daily with a meal., Disp: 60 capsule, Rfl: 0 Continue all other maintenance medications  as listed above.  Follow up plan: Return in about 6 weeks (around 11/15/2017) for recheck.  Educational handout given for instructions  Terald Sleeper PA-C [facility] [address]  [place], [place] [address] [phone]   10/06/2017, 9:56 PM

## 2017-11-15 ENCOUNTER — Ambulatory Visit: Payer: BLUE CROSS/BLUE SHIELD | Admitting: Physician Assistant

## 2017-11-15 ENCOUNTER — Other Ambulatory Visit: Payer: Self-pay | Admitting: Endocrinology

## 2017-11-15 DIAGNOSIS — N912 Amenorrhea, unspecified: Secondary | ICD-10-CM

## 2017-11-18 ENCOUNTER — Ambulatory Visit: Payer: BLUE CROSS/BLUE SHIELD | Admitting: Physician Assistant

## 2017-11-21 ENCOUNTER — Encounter: Payer: Self-pay | Admitting: Family Medicine

## 2017-12-18 ENCOUNTER — Encounter: Payer: Self-pay | Admitting: Physician Assistant

## 2017-12-18 ENCOUNTER — Ambulatory Visit (INDEPENDENT_AMBULATORY_CARE_PROVIDER_SITE_OTHER): Payer: BLUE CROSS/BLUE SHIELD | Admitting: Physician Assistant

## 2017-12-18 VITALS — BP 105/71 | HR 69 | Ht 59.0 in | Wt 166.8 lb

## 2017-12-18 DIAGNOSIS — Z3A01 Less than 8 weeks gestation of pregnancy: Secondary | ICD-10-CM | POA: Diagnosis not present

## 2017-12-18 DIAGNOSIS — Z3490 Encounter for supervision of normal pregnancy, unspecified, unspecified trimester: Secondary | ICD-10-CM | POA: Insufficient documentation

## 2017-12-18 DIAGNOSIS — F332 Major depressive disorder, recurrent severe without psychotic features: Secondary | ICD-10-CM | POA: Diagnosis not present

## 2017-12-18 DIAGNOSIS — N912 Amenorrhea, unspecified: Secondary | ICD-10-CM

## 2017-12-18 NOTE — Patient Instructions (Signed)
geodon stop now,  Minipress stop in 4 days Clonazepam can be stopped anytime now

## 2017-12-19 NOTE — Progress Notes (Addendum)
BP 105/71   Pulse 69   Ht 4\' 11"  (1.499 m)   Wt 166 lb 12.8 oz (75.7 kg)   BMI 33.69 kg/m    Subjective:    Patient ID: Kelly Barry, female    DOB: Sep 28, 1991, 27 y.o.   MRN: [id number]  HPI: Kelly Barry is a 27 y.o. female presenting on 12/18/2017 for Follow-up (patient is pregnant and needs to stop some medication ) Patient is [redacted] weeks pregnant.  Overall she is feeling good.  She had already discontinued many of her medications.  We have talked about the order and how to discontinue them the instructions are below.  She has no other questions at this time.   Past Medical History:  Diagnosis Date  . Adopted   . Anxiety   . Asthma   . Bipolar disorder ([place])   . Borderline personality disorder ([place])   . Depression   . GERD (gastroesophageal reflux disease)   . Headache    Migraines  . PTSD (post-traumatic stress disorder)   . Suicide attempt [facility])    Relevant past medical, surgical, family and social history reviewed and updated as indicated. Interim medical history since our last visit reviewed. Allergies and medications reviewed and updated. DATA REVIEWED: CHART IN EPIC  Family History reviewed for pertinent findings.  Review of Systems  Constitutional: Negative.   HENT: Negative.   Eyes: Negative.   Respiratory: Negative.   Gastrointestinal: Negative.   Genitourinary: Negative.     Allergies as of 12/18/2017      Reactions   Diclofenac Anaphylaxis, Rash   Hydroxyzine Other (See Comments)   Headaches....fevers   Icy Hot Rash      Medication List        Accurate as of 12/18/17 11:59 PM. Always use your most recent med list.          clonazePAM 0.5 MG tablet Commonly known as:  KLONOPIN Take 1 tablet (0.5 mg total) by mouth 3 (three) times daily as needed for anxiety.   prazosin 2 MG capsule Commonly known as:  MINIPRESS Take 2 capsules (4 mg total) by mouth at bedtime.   sertraline 100 MG tablet Commonly known as:  ZOLOFT Take 1 tablet (100  mg total) by mouth daily.   ziprasidone 20 MG capsule Commonly known as:  GEODON Take 1 capsule (20 mg total) by mouth 2 (two) times daily with a meal.          Objective:    BP 105/71   Pulse 69   Ht 4\' 11"  (1.499 m)   Wt 166 lb 12.8 oz (75.7 kg)   BMI 33.69 kg/m   Allergies  Allergen Reactions  . Diclofenac Anaphylaxis and Rash  . Hydroxyzine Other (See Comments)    Headaches....fevers  . Icy Hot Rash    Wt Readings from Last 3 Encounters:  12/18/17 166 lb 12.8 oz (75.7 kg)  10/04/17 160 lb 12.8 oz (72.9 kg)  07/04/17 149 lb 12.8 oz (67.9 kg)    Physical Exam  Constitutional: She is oriented to person, place, and time. She appears well-developed and well-nourished.  HENT:  Head: Normocephalic and atraumatic.  Eyes: Conjunctivae and EOM are normal. Pupils are equal, round, and reactive to light.  Cardiovascular: Normal rate, regular rhythm, normal heart sounds and intact distal pulses.  Pulmonary/Chest: Effort normal and breath sounds normal.  Abdominal: Soft. Bowel sounds are normal.  Neurological: She is alert and oriented to person, place, and time. She has  normal reflexes.  Skin: Skin is warm and dry. No rash noted.  Psychiatric: She has a normal mood and affect. Her behavior is normal. Judgment and thought content normal.        Assessment & Plan:   1. Amenorrhea  2. Less than [redacted] weeks gestation of pregnancy  3. Severe episode of recurrent major depressive disorder, without psychotic features ([place])  Stop Geodon today.  In 4 days stop Minipress Use melatonin for sleep Clonazepam can be discontinued at this point also  Continue all other maintenance medications as listed above.  Follow up plan: Return if symptoms worsen or fail to improve.  Educational handout given for [place] PA-C [facility] [address]  [place], [place] [address] [phone]   12/19/2017, 1:44 PM

## 2017-12-31 ENCOUNTER — Other Ambulatory Visit: Payer: Self-pay

## 2017-12-31 ENCOUNTER — Encounter (HOSPITAL_COMMUNITY): Payer: Self-pay | Admitting: *Deleted

## 2018-01-01 ENCOUNTER — Other Ambulatory Visit: Payer: Self-pay | Admitting: Obstetrics and Gynecology

## 2018-01-01 DIAGNOSIS — Z419 Encounter for procedure for purposes other than remedying health state, unspecified: Secondary | ICD-10-CM

## 2018-01-02 ENCOUNTER — Ambulatory Visit (HOSPITAL_COMMUNITY): Payer: BLUE CROSS/BLUE SHIELD

## 2018-01-02 ENCOUNTER — Other Ambulatory Visit: Payer: Self-pay

## 2018-01-02 ENCOUNTER — Encounter (HOSPITAL_COMMUNITY): Payer: Self-pay

## 2018-01-02 ENCOUNTER — Encounter (HOSPITAL_COMMUNITY)
Admission: AD | Disposition: A | Discharge status: Home / Self Care | Payer: Self-pay | Source: Ambulatory Visit | Attending: Obstetrics and Gynecology

## 2018-01-02 ENCOUNTER — Ambulatory Visit (HOSPITAL_COMMUNITY): Payer: BLUE CROSS/BLUE SHIELD | Admitting: Anesthesiology

## 2018-01-02 ENCOUNTER — Ambulatory Visit (HOSPITAL_COMMUNITY)
Admission: AD | Admit: 2018-01-02 | Discharge: 2018-01-02 | Disposition: A | Discharge status: Home / Self Care | Payer: BLUE CROSS/BLUE SHIELD | Source: Ambulatory Visit | Attending: Obstetrics and Gynecology | Admitting: Obstetrics and Gynecology

## 2018-01-02 DIAGNOSIS — Z79899 Other long term (current) drug therapy: Secondary | ICD-10-CM | POA: Insufficient documentation

## 2018-01-02 DIAGNOSIS — F603 Borderline personality disorder: Secondary | ICD-10-CM | POA: Insufficient documentation

## 2018-01-02 DIAGNOSIS — O99511 Diseases of the respiratory system complicating pregnancy, first trimester: Secondary | ICD-10-CM | POA: Insufficient documentation

## 2018-01-02 DIAGNOSIS — R58 Hemorrhage, not elsewhere classified: Secondary | ICD-10-CM

## 2018-01-02 DIAGNOSIS — Z87891 Personal history of nicotine dependence: Secondary | ICD-10-CM | POA: Diagnosis not present

## 2018-01-02 DIAGNOSIS — J45909 Unspecified asthma, uncomplicated: Secondary | ICD-10-CM | POA: Diagnosis not present

## 2018-01-02 DIAGNOSIS — F319 Bipolar disorder, unspecified: Secondary | ICD-10-CM | POA: Insufficient documentation

## 2018-01-02 DIAGNOSIS — F4310 Post-traumatic stress disorder, unspecified: Secondary | ICD-10-CM | POA: Insufficient documentation

## 2018-01-02 DIAGNOSIS — O021 Missed abortion: Secondary | ICD-10-CM | POA: Diagnosis present

## 2018-01-02 DIAGNOSIS — O99341 Other mental disorders complicating pregnancy, first trimester: Secondary | ICD-10-CM | POA: Insufficient documentation

## 2018-01-02 HISTORY — PX: DILATION AND EVACUATION: SHX1459

## 2018-01-02 LAB — CBC
HEMATOCRIT: 40.3 % (ref 36.0–46.0)
HEMOGLOBIN: 14.4 g/dL (ref 12.0–15.0)
MCH: 31 pg (ref 26.0–34.0)
MCHC: 35.7 g/dL (ref 30.0–36.0)
MCV: 86.9 fL (ref 78.0–100.0)
Platelets: 257 10*3/uL (ref 150–400)
RBC: 4.64 MIL/uL (ref 3.87–5.11)
RDW: 12.4 % (ref 11.5–15.5)
WBC: 8.9 10*3/uL (ref 4.0–10.5)

## 2018-01-02 LAB — ABO/RH: ABO/RH(D): B POS

## 2018-01-02 SURGERY — DILATION AND EVACUATION, UTERUS
Anesthesia: Monitor Anesthesia Care | Site: Vagina

## 2018-01-02 MED ORDER — IBUPROFEN 800 MG PO TABS: 800.0000 mg | ORAL_TABLET | Freq: Three times a day (TID) | ORAL | 0 refills | Status: DC | PRN

## 2018-01-02 MED ORDER — CHLOROPROCAINE HCL 1 % IJ SOLN
INTRAMUSCULAR | Status: AC
  Filled 2018-01-02: qty 30

## 2018-01-02 MED ORDER — DOXYCYCLINE HYCLATE 100 MG PO TABS: 100.0000 mg | ORAL_TABLET | Freq: Two times a day (BID) | ORAL | 0 refills | Status: AC

## 2018-01-02 MED ORDER — FENTANYL CITRATE (PF) 100 MCG/2ML IJ SOLN
INTRAMUSCULAR | Status: AC
  Filled 2018-01-02: qty 2

## 2018-01-02 MED ORDER — SCOPOLAMINE 1 MG/3DAYS TD PT72
1.0000 | MEDICATED_PATCH | Freq: Once | TRANSDERMAL | Status: DC
  Administered 2018-01-02: 1.5 mg via TRANSDERMAL

## 2018-01-02 MED ORDER — MIDAZOLAM HCL 2 MG/2ML IJ SOLN
INTRAMUSCULAR | Status: AC
  Filled 2018-01-02: qty 2

## 2018-01-02 MED ORDER — LACTATED RINGERS IV SOLN
INTRAVENOUS | Status: DC
  Administered 2018-01-02: 13:00:00 via INTRAVENOUS

## 2018-01-02 MED ORDER — LIDOCAINE HCL (CARDIAC) 20 MG/ML IV SOLN
INTRAVENOUS | Status: DC | PRN
  Administered 2018-01-02: 60 mg via INTRAVENOUS

## 2018-01-02 MED ORDER — FENTANYL CITRATE (PF) 100 MCG/2ML IJ SOLN
INTRAMUSCULAR | Status: DC | PRN
  Administered 2018-01-02: 50 ug via INTRAVENOUS
  Administered 2018-01-02: 100 ug via INTRAVENOUS
  Administered 2018-01-02: 50 ug via INTRAVENOUS

## 2018-01-02 MED ORDER — SODIUM CHLORIDE 0.9 % IV SOLN
100.0000 mg | Freq: Two times a day (BID) | INTRAVENOUS | Status: DC
  Filled 2018-01-02: qty 100

## 2018-01-02 MED ORDER — MIDAZOLAM HCL 2 MG/2ML IJ SOLN: 0.5000 mg | Freq: Once | INTRAMUSCULAR | Status: DC | PRN

## 2018-01-02 MED ORDER — MIDAZOLAM HCL 2 MG/2ML IJ SOLN
INTRAMUSCULAR | Status: DC | PRN
  Administered 2018-01-02: 2 mg via INTRAVENOUS

## 2018-01-02 MED ORDER — KETOROLAC TROMETHAMINE 30 MG/ML IJ SOLN
INTRAMUSCULAR | Status: DC | PRN
  Administered 2018-01-02: 30 mg via INTRAVENOUS
  Administered 2018-01-02: 30 mg via INTRAMUSCULAR

## 2018-01-02 MED ORDER — PROPOFOL 500 MG/50ML IV EMUL
INTRAVENOUS | Status: DC | PRN
  Administered 2018-01-02 (×2): 20 mg via INTRAVENOUS
  Administered 2018-01-02: 50 mg via INTRAVENOUS
  Administered 2018-01-02 (×2): 20 mg via INTRAVENOUS

## 2018-01-02 MED ORDER — FENTANYL CITRATE (PF) 100 MCG/2ML IJ SOLN: 25.0000 ug | INTRAMUSCULAR | Status: DC | PRN

## 2018-01-02 MED ORDER — DEXAMETHASONE SODIUM PHOSPHATE 10 MG/ML IJ SOLN
INTRAMUSCULAR | Status: DC | PRN
  Administered 2018-01-02: 4 mg via INTRAVENOUS

## 2018-01-02 MED ORDER — ONDANSETRON HCL 4 MG/2ML IJ SOLN
INTRAMUSCULAR | Status: DC | PRN
  Administered 2018-01-02: 4 mg via INTRAVENOUS

## 2018-01-02 MED ORDER — MEPERIDINE HCL 25 MG/ML IJ SOLN: 6.2500 mg | INTRAMUSCULAR | Status: DC | PRN

## 2018-01-02 MED ORDER — SCOPOLAMINE 1 MG/3DAYS TD PT72
MEDICATED_PATCH | TRANSDERMAL | Status: AC
  Administered 2018-01-02: 1.5 mg via TRANSDERMAL
  Filled 2018-01-02: qty 1

## 2018-01-02 MED ORDER — PROMETHAZINE HCL 25 MG/ML IJ SOLN: 6.2500 mg | INTRAMUSCULAR | Status: DC | PRN

## 2018-01-02 MED ORDER — SODIUM CHLORIDE 0.9 % IV SOLN
INTRAVENOUS | Status: DC | PRN
  Administered 2018-01-02: 100 mg via INTRAVENOUS

## 2018-01-02 SURGICAL SUPPLY — 17 items
CATH ROBINSON RED A/P 16FR (CATHETERS) ×3 IMPLANT
DECANTER SPIKE VIAL GLASS SM (MISCELLANEOUS) ×3 IMPLANT
GLOVE BIOGEL PI IND STRL 7.0 (GLOVE) ×2 IMPLANT
GLOVE BIOGEL PI INDICATOR 7.0 (GLOVE) ×4
GLOVE ECLIPSE 6.5 STRL STRAW (GLOVE) ×3 IMPLANT
GOWN STRL REUS W/TWL LRG LVL3 (GOWN DISPOSABLE) ×6 IMPLANT
KIT BERKELEY 1ST TRIMESTER 3/8 (MISCELLANEOUS) ×3 IMPLANT
NS IRRIG 1000ML POUR BTL (IV SOLUTION) ×3 IMPLANT
PACK VAGINAL MINOR WOMEN LF (CUSTOM PROCEDURE TRAY) ×3 IMPLANT
PAD OB MATERNITY 4.3X12.25 (PERSONAL CARE ITEMS) ×3 IMPLANT
PAD PREP 24X48 CUFFED NSTRL (MISCELLANEOUS) ×3 IMPLANT
SET BERKELEY SUCTION TUBING (SUCTIONS) ×3 IMPLANT
TOWEL OR 17X24 6PK STRL BLUE (TOWEL DISPOSABLE) ×6 IMPLANT
VACURETTE 10 RIGID CVD (CANNULA) IMPLANT
VACURETTE 7MM CVD STRL WRAP (CANNULA) ×3 IMPLANT
VACURETTE 8 RIGID CVD (CANNULA) IMPLANT
VACURETTE 9 RIGID CVD (CANNULA) IMPLANT

## 2018-01-02 NOTE — Anesthesia Postprocedure Evaluation (Signed)
Anesthesia Post Note  Patient: Kelly Barry  Procedure(s) Performed: ULTRASOUND GUIDED DILATATION AND EVACUATION (N/A Vagina )     Patient location during evaluation: PACU Anesthesia Type: MAC Level of consciousness: awake and alert, patient cooperative and oriented Pain management: pain level controlled Vital Signs Assessment: post-procedure vital signs reviewed and stable Respiratory status: nonlabored ventilation, spontaneous breathing and respiratory function stable Cardiovascular status: blood pressure returned to baseline and stable Postop Assessment: no apparent nausea or vomiting Anesthetic complications: no    Last Vitals:  Vitals:   01/02/18 1535 01/02/18 1600  BP:  115/65  Pulse: 67 68  Resp: 17   Temp: 37 C   SpO2: 100% 100%    Last Pain:  Vitals:   01/02/18 1535  TempSrc: Oral   Pain Goal: Patients Stated Pain Goal: 3 (01/02/18 1219)               Seleta Rhymes. Kelly Barry

## 2018-01-02 NOTE — Anesthesia Preprocedure Evaluation (Addendum)
Anesthesia Evaluation  Patient identified by MRN, date of birth, ID band Patient awake    Reviewed: Allergy & Precautions, NPO status , Patient's Chart, lab work & pertinent test results  History of Anesthesia Complications Negative for: history of anesthetic complications  Airway Mallampati: II  TM Distance: >3 FB Neck ROM: Full    Dental  (+) Dental Advisory Given   Pulmonary asthma , COPD: last inhaler needed a week ago., former smoker,    breath sounds clear to auscultation       Cardiovascular negative cardio ROS   Rhythm:Regular Rate:Normal     Neuro/Psych  Headaches, PSYCHIATRIC DISORDERS (PTSD) Anxiety Depression Bipolar Disorder    GI/Hepatic Neg liver ROS, GERD  Controlled,  Endo/Other  Morbid obesity  Renal/GU negative Renal ROS     Musculoskeletal   Abdominal (+) + obese,   Peds  Hematology plt 257k   Anesthesia Other Findings   Reproductive/Obstetrics (+) Pregnancy                            Anesthesia Physical Anesthesia Plan  ASA: II  Anesthesia Plan: MAC   Post-op Pain Management:    Induction:   PONV Risk Score and Plan: 2 and Treatment may vary due to age or medical condition, Ondansetron and Dexamethasone  Airway Management Planned: Natural Airway and Simple Face Mask  Additional Equipment:   Intra-op Plan:   Post-operative Plan:   Informed Consent: I have reviewed the patients History and Physical, chart, labs and discussed the procedure including the risks, benefits and alternatives for the proposed anesthesia with the patient or authorized representative who has indicated his/her understanding and acceptance.   Dental advisory given  Plan Discussed with: CRNA and Surgeon  Anesthesia Plan Comments: (Plan routine monitors, MAC)       Anesthesia Quick Evaluation

## 2018-01-02 NOTE — Discharge Instructions (Signed)
DISCHARGE INSTRUCTIONS: D&C / D&E °The following instructions have been prepared to help you care for yourself upon your return home. °  °Personal hygiene: °• Use sanitary pads for vaginal drainage, not tampons. °• Shower the day after your procedure. °• NO tub baths, pools or Jacuzzis for 2-3 weeks. °• Wipe front to back after using the bathroom. ° °Activity and limitations: °• Do NOT drive or operate any equipment for 24 hours. The effects of anesthesia are still present and drowsiness may result. °• Do NOT rest in bed all day. °• Walking is encouraged. °• Walk up and down stairs slowly. °• You may resume your normal activity in one to two days or as indicated by your physician. ° °Sexual activity: NO intercourse for at least 2 weeks after the procedure, or as indicated by your physician. ° °Diet: Eat a light meal as desired this evening. You may resume your usual diet tomorrow. ° °Return to work: You may resume your work activities in one to two days or as indicated by your doctor. ° °What to expect after your surgery: Expect to have vaginal bleeding/discharge for 2-3 days and spotting for up to 10 days. It is not unusual to have soreness for up to 1-2 weeks. You may have a slight burning sensation when you urinate for the first day. Mild cramps may continue for a couple of days. You may have a regular period in 2-6 weeks. ° °Call your doctor for any of the following: °• Excessive vaginal bleeding, saturating and changing one pad every hour. °• Inability to urinate 6 hours after discharge from hospital. °• Pain not relieved by pain medication. °• Fever of 100.4° F or greater. °• Unusual vaginal discharge or odor. ° ° Call for an appointment:  ° ° °Patient’s signature: ______________________ ° °Nurse’s signature ________________________ ° °Support person's signature_______________________ ° ° °DISCHARGE INSTRUCTIONS: D&C / D&E °The following instructions have been prepared to help you care for yourself upon your  return home. °  °Personal hygiene: °• Use sanitary pads for vaginal drainage, not tampons. °• Shower the day after your procedure. °• NO tub baths, pools or Jacuzzis for 2-3 weeks. °• Wipe front to back after using the bathroom. ° °Activity and limitations: °• Do NOT drive or operate any equipment for 24 hours. The effects of anesthesia are still present and drowsiness may result. °• Do NOT rest in bed all day. °• Walking is encouraged. °• Walk up and down stairs slowly. °• You may resume your normal activity in one to two days or as indicated by your physician. ° °Sexual activity: NO intercourse for at least 2 weeks after the procedure, or as indicated by your physician. ° °Diet: Eat a light meal as desired this evening. You may resume your usual diet tomorrow. ° °Return to work: You may resume your work activities in one to two days or as indicated by your doctor. ° °What to expect after your surgery: Expect to have vaginal bleeding/discharge for 2-3 days and spotting for up to 10 days. It is not unusual to have soreness for up to 1-2 weeks. You may have a slight burning sensation when you urinate for the first day. Mild cramps may continue for a couple of days. You may have a regular period in 2-6 weeks. ° °Call your doctor for any of the following: °• Excessive vaginal bleeding, saturating and changing one pad every hour. °• Inability to urinate 6 hours after discharge from hospital. °• Pain not relieved by   pain medication.  Fever of 100.4 F or greater.  Unusual vaginal discharge or odor.   Call for an appointment:    Patients signature: ______________________  Nurses signature ________________________  Support person's signature_______________________   DISCHARGE INSTRUCTIONS: D&C / D&E The following instructions have been prepared to help you care for yourself upon your return home.   Personal hygiene:  Use sanitary pads for vaginal drainage, not tampons.  Shower the day after your  procedure.  NO tub baths, pools or Jacuzzis for 2-3 weeks.  Wipe front to back after using the bathroom.  Activity and limitations:  Do NOT drive or operate any equipment for 24 hours. The effects of anesthesia are still present and drowsiness may result.  Do NOT rest in bed all day.  Walking is encouraged.  Walk up and down stairs slowly.  You may resume your normal activity in one to two days or as indicated by your physician.  Sexual activity: NO intercourse for at least 2 weeks after the procedure, or as indicated by your physician.  Diet: Eat a light meal as desired this evening. You may resume your usual diet tomorrow.  Return to work: You may resume your work activities in one to two days or as indicated by your doctor.  What to expect after your surgery: Expect to have vaginal bleeding/discharge for 2-3 days and spotting for up to 10 days. It is not unusual to have soreness for up to 1-2 weeks. You may have a slight burning sensation when you urinate for the first day. Mild cramps may continue for a couple of days. You may have a regular period in 2-6 weeks.  Call your doctor for any of the following:  Excessive vaginal bleeding, saturating and changing one pad every hour.  Inability to urinate 6 hours after discharge from hospital.  Pain not relieved by pain medication.  Fever of 100.4 F or greater.  Unusual vaginal discharge or odor.   Call for an appointment:    Patients signature: ______________________  Nurses signature ________________________  Support person's signature_______________________

## 2018-01-02 NOTE — H&P (Signed)
Kelly Barry is an 27 y.o. female.G1P0 SF with dx of Missed Abortion here for surgical mgmt of missed abortion. Pt requesting nexplanon for contraception  Pertinent Gynecological History: Menses: oligomenorrhea Bleeding: irreg Contraception: none DES exposure: denies Blood transfusions: none Sexually transmitted diseases: no past history Previous GYN Procedures: none  Last mammogram: n/a Date: n/a Last pap: normal Date: nl OB History: G1P0010   Menstrual History: Menarche age: n/a No LMP recorded (lmp unknown).    Past Medical History:  Diagnosis Date  . Adopted   . Anxiety   . Asthma   . Bipolar disorder (Kelly Barry)   . Borderline personality disorder (Kelly Barry)   . Depression   . GERD (gastroesophageal reflux disease)   . Headache    Migraines  . PTSD (post-traumatic stress disorder)   . Suicide attempt [facility])     Past Surgical History:  Procedure Laterality Date  . ESOPHAGOGASTRODUODENOSCOPY  06/25/2012   Procedure: ESOPHAGOGASTRODUODENOSCOPY (EGD);  Surgeon: Kelly Silence, MD;  Location: [facility];  Service: Kelly Barry;  Laterality: N/A;  Kelly Barry/ebp  . GANGLION CYST EXCISION    . Kelly Barry N/A 10/20/2015   Procedure: Kelly Barry;  Surgeon: Kelly Pupa, DO;  Location: [facility];  Service: Kelly Barry;  Laterality: N/A;  . Kelly Barry REMOVAL N/A 10/20/2015   Procedure: Kelly Barry (Kelly Barry) REMOVAL;  Surgeon: Kelly Pupa, DO;  Location: Kelly [facility];  Service: Kelly Barry;  Laterality: N/A;    Family History  Adopted: Yes    Social History:  reports that she has quit smoking. Her smoking use included cigarettes. She has a 0.02 pack-year smoking history. she has never used smokeless tobacco. She reports that she does not drink alcohol or use drugs.  Allergies:  Allergies  Allergen Reactions  . Diclofenac Anaphylaxis and Rash  . Hydroxyzine Other (See Comments)    Headaches....fevers  . Icy Hot Rash    Medications Prior to Admission  Medication Sig Dispense Refill  Last Dose  . acetaminophen (TYLENOL) 500 MG tablet Take 1,000 mg by mouth every 6 (six) hours as needed (for pain.).   Past Week at Unknown time  . clonazePAM (KLONOPIN) 0.5 MG tablet Take 1 tablet (0.5 mg total) by mouth 3 (three) times daily as needed for anxiety. (Patient taking differently: Take 0.5 mg by mouth at bedtime as needed for anxiety. ) 60 tablet 5 01/01/2018 at Unknown time  . prazosin (MINIPRESS) 2 MG capsule Take 2 capsules (4 mg total) by mouth at bedtime. 60 capsule 5 01/01/2018 at Unknown time  . sertraline (ZOLOFT) 100 MG tablet Take 1 tablet (100 mg total) by mouth daily. (Patient taking differently: Take 100 mg by mouth at bedtime. ) 30 tablet 5 01/01/2018 at Unknown time  . topiramate (TOPAMAX) 100 MG tablet Take 100 mg by mouth daily.   01/01/2018 at Unknown time  . ziprasidone (GEODON) 60 MG capsule Take 60 mg by mouth at bedtime.   01/01/2018 at Unknown time  . ziprasidone (GEODON) 20 MG capsule Take 1 capsule (20 mg total) by mouth 2 (two) times daily with a meal. 60 capsule 0 Taking    Review of Systems  All other systems reviewed and are negative.   Blood pressure 111/65, pulse 82, temperature 98.1 F (36.7 C), temperature source Oral, resp. rate 16, height 4\' 11"  (1.499 m), weight 72.6 kg (160 lb), SpO2 100 %. Physical Exam  Constitutional: She is oriented to person, place, and time. She appears well-developed and well-nourished.  HENT:  Head: Atraumatic.  Eyes: EOM are normal.  Neck:  Neck supple.  Cardiovascular: Regular rhythm.  Respiratory: Breath sounds normal.  GI: Soft.  Genitourinary: Vagina normal.  Neurological: She is alert and oriented to person, place, and time.  Skin: Skin is warm and dry.    Results for orders placed or performed during the hospital encounter of 01/02/18 (from the past 24 hour(s))  ABO/Rh     Status: None (Preliminary result)   Collection Time: 01/02/18 12:20 PM  Result Value Ref Range   ABO/RH(D)      B POS Performed at  [facility], [address], [place], [place] [address]   CBC     Status: None   Collection Time: 01/02/18 12:20 PM  Result Value Ref Range   WBC 8.9 4.0 - 10.5 K/uL   RBC 4.64 3.87 - 5.11 MIL/uL   Hemoglobin 14.4 12.0 - 15.0 g/dL   HCT 40.3 36.0 - 46.0 %   MCV 86.9 78.0 - 100.0 fL   MCH 31.0 26.0 - 34.0 pg   MCHC 35.7 30.0 - 36.0 g/dL   RDW 12.4 11.5 - 15.5 %   Platelets 257 150 - 400 K/uL    No results found.  Assessment/Plan: MAB P) u/s suction D&E. Risk of surgery including infection, bleeding, injury to surrounding organ structures, thermal injury, uterine perforation and its mgmt, retained POC. All ? answered  [name] A [name] 01/02/2018, 1:44 PM

## 2018-01-02 NOTE — Brief Op Note (Signed)
01/02/2018  2:40 PM  PATIENT:  Kelly Barry  27 y.o. female  PRE-OPERATIVE DIAGNOSIS:  Missed Abortion  POST-OPERATIVE DIAGNOSIS:  missed abortion  PROCEDURE:  Ultrasound guided suction dilation and evacuation  SURGEON:  Surgeon(s) and Role:    * Servando Salina, MD - Primary  PHYSICIAN ASSISTANT:   ASSISTANTS: none   ANESTHESIA:   general  EBL:  30 mL   BLOOD ADMINISTERED:none  DRAINS: none   LOCAL MEDICATIONS USED:  NONE  SPECIMEN:  Source of Specimen:  POC  DISPOSITION OF SPECIMEN:  PATHOLOGY  COUNTS:  YES  TOURNIQUET:  * No tourniquets in log *  DICTATION: .Other Dictation: Dictation Number [id number]  PLAN OF CARE: Discharge to home after PACU  PATIENT DISPOSITION:  PACU - hemodynamically stable.   Delay start of Pharmacological VTE agent (>24hrs) due to surgical blood loss or risk of bleeding: no

## 2018-01-02 NOTE — Transfer of Care (Signed)
Immediate Anesthesia Transfer of Care Note  Patient: Kelly Barry  Procedure(s) Performed: ULTRASOUND GUIDED DILATATION AND EVACUATION (N/A Vagina )  Patient Location: PACU  Anesthesia Type:MAC  Level of Consciousness: awake, alert  and oriented  Airway & Oxygen Therapy: Patient Spontanous Breathing  Post-op Assessment: Report given to RN and Post -op Vital signs reviewed and stable  Post vital signs: Reviewed and stable  Last Vitals:  Vitals:   01/02/18 1224  BP: 111/65  Pulse: 82  Resp: 16  Temp: 36.7 C  SpO2: 100%    Last Pain:  Vitals:   01/02/18 1224  TempSrc: Oral      Patients Stated Pain Goal: 3 (24/15/90 1724)  Complications: No apparent anesthesia complications

## 2018-01-03 ENCOUNTER — Encounter (HOSPITAL_COMMUNITY): Payer: Self-pay | Admitting: Obstetrics and Gynecology

## 2018-01-03 NOTE — Op Note (Signed)
NAME:  Kelly Barry, Kelly Barry                ACCOUNT NO.:  [id number]  MEDICAL RECORD NO.:  [id number]  LOCATION:                                 FACILITY:  PHYSICIAN:  Servando Salina, M.D.DATE OF BIRTH:  08-31-1991  DATE OF PROCEDURE:  01/02/2018 DATE OF DISCHARGE:  01/02/2018                              OPERATIVE REPORT   PREOPERATIVE DIAGNOSIS:  Missed abortion.  PROCEDURE:  Ultrasound-guided suction dilation and evacuation.  POSTOPERATIVE DIAGNOSIS:  Missed abortion.  ANESTHESIA:  MAC, paracervical block.  SURGEON:  Servando Salina, M.D.  ASSISTANT:  None.  DESCRIPTION OF PROCEDURE:  Under adequate monitored anesthesia, the patient was placed in dorsal lithotomy position.  She was sterilely prepped and draped in usual fashion.  Foley was not placed due to planned ultrasound.  Exam under anesthesia revealed anteverted uterus. No adnexal masses could be appreciated.  Bivalve speculum was placed into the vagina.  Single-tooth tenaculum was placed on the anterior lip of the cervix.  20 cc of 1% Nesacaine was injected paracervically at the 3 and 9 o'clock positions.  The cervix was then serially dilated up to #27 [facility] dilator.  A #7 mm curved suction cannula was introduced into the uterine cavity under direct ultrasound guidance, the products of conception was removed. When all tissue was felt to have been removed, the procedure was terminated by removing all instruments from the vagina.  SPECIMENS:  Products of conception sent to Pathology.  ESTIMATED BLOOD LOSS:  10 cc.  COMPLICATIONS:  None.  The patient tolerated the procedure well, was transferred to recovery room in stable condition.     Servando Salina, M.D.    [place]/MEDQ  D:  01/02/2018  T:  01/02/2018  Job:  [id number]

## 2018-02-10 ENCOUNTER — Other Ambulatory Visit: Payer: Self-pay | Admitting: Physician Assistant

## 2018-02-10 DIAGNOSIS — F4310 Post-traumatic stress disorder, unspecified: Secondary | ICD-10-CM

## 2018-02-19 ENCOUNTER — Ambulatory Visit (INDEPENDENT_AMBULATORY_CARE_PROVIDER_SITE_OTHER): Payer: BLUE CROSS/BLUE SHIELD | Admitting: Physician Assistant

## 2018-02-19 ENCOUNTER — Encounter: Payer: Self-pay | Admitting: Physician Assistant

## 2018-02-19 DIAGNOSIS — F332 Major depressive disorder, recurrent severe without psychotic features: Secondary | ICD-10-CM

## 2018-02-19 DIAGNOSIS — F4310 Post-traumatic stress disorder, unspecified: Secondary | ICD-10-CM | POA: Diagnosis not present

## 2018-02-19 DIAGNOSIS — Z8759 Personal history of other complications of pregnancy, childbirth and the puerperium: Secondary | ICD-10-CM

## 2018-02-19 MED ORDER — CLONAZEPAM 0.5 MG PO TABS: 0.5000 mg | ORAL_TABLET | Freq: Three times a day (TID) | ORAL | 3 refills | Status: DC | PRN

## 2018-02-19 MED ORDER — ZIPRASIDONE HCL 60 MG PO CAPS: 60.0000 mg | ORAL_CAPSULE | Freq: Every day | ORAL | 3 refills | Status: DC

## 2018-02-19 MED ORDER — PRAZOSIN HCL 2 MG PO CAPS: 4.0000 mg | ORAL_CAPSULE | Freq: Every day | ORAL | 3 refills | Status: DC

## 2018-02-19 NOTE — Progress Notes (Signed)
BP 126/84   Pulse 79   Temp 98.4 F (36.9 C) (Oral)   Ht 5\' 1"  (1.549 m)   Wt 176 lb 6.4 oz (80 kg)   BMI 33.33 kg/m    Subjective:    Patient ID: Kelly Barry, female    DOB: 03/11/1991, 27 y.o.   MRN: [id number]  HPI: Kelly Barry is a 27 y.o. female presenting on 02/19/2018 for Medication Refill  This patient comes in for recheck on her medications.  She has a history of bipolar disorder, anxiety, posttraumatic stress disorder.  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  She miscarried earlier this year.  She had gone off of her medications at that time and was able to restart them after the miscarriage.  She does need refills now.  Past Medical History:  Diagnosis Date  . Adopted   . Anxiety   . Asthma   . Bipolar disorder ([place])   . Borderline personality disorder ([place])   . Depression   . GERD (gastroesophageal reflux disease)   . Headache    Migraines  . PTSD (post-traumatic stress disorder)   . Suicide attempt [facility])    Relevant past medical, surgical, family and social history reviewed and updated as indicated. Interim medical history since our last visit reviewed. Allergies and medications reviewed and updated. DATA REVIEWED: CHART IN EPIC  Family History reviewed for pertinent findings.  Review of Systems  Constitutional: Negative.   HENT: Negative.   Eyes: Negative.   Respiratory: Negative.   Gastrointestinal: Negative.   Genitourinary: Negative.     Allergies as of 02/19/2018      Reactions   Diclofenac Anaphylaxis, Rash   Hydroxyzine Other (See Comments)   Headaches....fevers   Icy Hot Rash      Medication List        Accurate as of 02/19/18 11:59 PM. Always use your most recent med list.          clonazePAM 0.5 MG tablet Commonly known as:  KLONOPIN Take 1 tablet (0.5 mg total) by mouth 3 (three) times daily as needed. for anxiety   prazosin 2 MG capsule Commonly known as:  MINIPRESS Take 2 capsules  (4 mg total) by mouth at bedtime.   ziprasidone 60 MG capsule Commonly known as:  GEODON Take 1 capsule (60 mg total) by mouth at bedtime.          Objective:    BP 126/84   Pulse 79   Temp 98.4 F (36.9 C) (Oral)   Ht 5\' 1"  (1.549 m)   Wt 176 lb 6.4 oz (80 kg)   BMI 33.33 kg/m   Allergies  Allergen Reactions  . Diclofenac Anaphylaxis and Rash  . Hydroxyzine Other (See Comments)    Headaches....fevers  . Icy Hot Rash    Wt Readings from Last 3 Encounters:  02/19/18 176 lb 6.4 oz (80 kg)  12/31/17 160 lb (72.6 kg)  12/18/17 166 lb 12.8 oz (75.7 kg)    Physical Exam  Constitutional: She is oriented to person, place, and time. She appears well-developed and well-nourished.  HENT:  Head: Normocephalic and atraumatic.  Eyes: Pupils are equal, round, and reactive to light. Conjunctivae and EOM are normal.  Cardiovascular: Normal rate, regular rhythm, normal heart sounds and intact distal pulses.  Pulmonary/Chest: Effort normal and breath sounds normal.  Abdominal: Soft. Bowel sounds are normal.  Neurological: She is alert and oriented to person, place, and time.  She has normal reflexes.  Skin: Skin is warm and dry. No rash noted.  Psychiatric: She has a normal mood and affect. Her behavior is normal. Judgment and thought content normal.        Assessment & Plan:   1. Severe episode of recurrent major depressive disorder, without psychotic features ([place]) - prazosin (MINIPRESS) 2 MG capsule; Take 2 capsules (4 mg total) by mouth at bedtime.  Dispense: 180 capsule; Refill: 3 - ziprasidone (GEODON) 60 MG capsule; Take 1 capsule (60 mg total) by mouth at bedtime.  Dispense: 90 capsule; Refill: 3  2. PTSD (post-traumatic stress disorder) - clonazePAM (KLONOPIN) 0.5 MG tablet; Take 1 tablet (0.5 mg total) by mouth 3 (three) times daily as needed. for anxiety  Dispense: 180 tablet; Refill: 3   Continue all other maintenance medications as listed above.  Follow up  plan: No follow-ups on file.  Educational handout given for [place] PA-C [place] [address]  [place], [place] [address] [phone]   02/21/2018, 2:13 PM

## 2018-02-21 DIAGNOSIS — Z8759 Personal history of other complications of pregnancy, childbirth and the puerperium: Secondary | ICD-10-CM | POA: Insufficient documentation

## 2018-08-11 ENCOUNTER — Ambulatory Visit: Payer: BLUE CROSS/BLUE SHIELD | Admitting: Physician Assistant

## 2018-08-11 ENCOUNTER — Encounter: Payer: Self-pay | Admitting: Physician Assistant

## 2018-08-11 DIAGNOSIS — F332 Major depressive disorder, recurrent severe without psychotic features: Secondary | ICD-10-CM

## 2018-08-11 DIAGNOSIS — Z23 Encounter for immunization: Secondary | ICD-10-CM

## 2018-08-11 DIAGNOSIS — F4310 Post-traumatic stress disorder, unspecified: Secondary | ICD-10-CM | POA: Diagnosis not present

## 2018-08-11 MED ORDER — PRAZOSIN HCL 2 MG PO CAPS: 4.0000 mg | ORAL_CAPSULE | Freq: Every day | ORAL | 3 refills | Status: DC

## 2018-08-11 MED ORDER — ZIPRASIDONE HCL 60 MG PO CAPS: 60.0000 mg | ORAL_CAPSULE | Freq: Every day | ORAL | 3 refills | Status: DC

## 2018-08-11 MED ORDER — CLONAZEPAM 0.5 MG PO TABS: 0.5000 mg | ORAL_TABLET | Freq: Three times a day (TID) | ORAL | 3 refills | Status: DC | PRN
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
## 2019-02-02 ENCOUNTER — Telehealth: Payer: Self-pay | Admitting: Physician Assistant

## 2019-02-02 NOTE — Telephone Encounter (Signed)
Patient notified visit can be done on the telephone.  Offered her a phone number for counseling.  She states she is planning to call [facility].

## 2019-02-02 NOTE — Telephone Encounter (Signed)
If it is for her depression, then phone visit okay

## 2019-02-02 NOTE — Telephone Encounter (Signed)
Different pcp?  Worker Comp patient?  Can she be done as a telephone visit for her depression?

## 2019-02-03 ENCOUNTER — Encounter: Payer: Self-pay | Admitting: Physician Assistant

## 2019-02-03 ENCOUNTER — Other Ambulatory Visit: Payer: Self-pay

## 2019-02-03 ENCOUNTER — Ambulatory Visit (INDEPENDENT_AMBULATORY_CARE_PROVIDER_SITE_OTHER): Payer: Medicaid Other | Admitting: Physician Assistant

## 2019-02-03 DIAGNOSIS — F321 Major depressive disorder, single episode, moderate: Secondary | ICD-10-CM

## 2019-02-03 DIAGNOSIS — F411 Generalized anxiety disorder: Secondary | ICD-10-CM | POA: Diagnosis not present

## 2019-02-03 MED ORDER — SERTRALINE HCL 50 MG PO TABS: 50.0000 mg | ORAL_TABLET | Freq: Every day | ORAL | 5 refills | Status: DC

## 2019-02-03 NOTE — Progress Notes (Signed)
Telephone visit  Subjective: CC: Depression and anxiety worse PCP: Terald Sleeper, PA-C HQI:ONGE Kelly Barry is a 28 y.o. female calls for telephone consult today. Patient provides verbal consent for consult held via phone.  Patient is identified with 2 separate identifiers.  At this time the entire area is on COVID-19 social distancing and stay home orders are in place.  Patient is of higher risk and therefore we are performing this by a virtual method.  Location of patient: Home Location of provider: [facility] Others present for call: No  This patient is having a phone visit concerning her depression anxiety.  She states that she is much worse with her anxiety with the current situation.  She does not have much income.  She is not able to get out of her house much at all.  She states that most days her depression is much worse.  She has been doing quite well.  She does remember taking Zoloft in the past and doing well with that.  I let her know this is a good choice.  Her PHQ score is elevated this time at 23. Depression screen [facility] 2/9 02/03/2019 08/11/2018 02/19/2018 10/04/2017 07/04/2017  Decreased Interest 3 0 0 0 1  Down, Depressed, Hopeless 3 0 1 0 1  PHQ - 2 Score 6 0 1 0 2  Altered sleeping 1 - - - 2  Tired, decreased energy 3 - - - 3  Change in appetite 3 - - - 3  Feeling bad or failure about yourself  3 - - - 3  Trouble concentrating 3 - - - 0  Moving slowly or fidgety/restless 3 - - - 2  Suicidal thoughts 1 - - - 0  PHQ-9 Score 23 - - - 15      ROS: Per HPI  Allergies  Allergen Reactions  . Diclofenac Anaphylaxis and Rash  . Hydroxyzine Other (See Comments)    Headaches....fevers  . Icy Hot Rash   Past Medical History:  Diagnosis Date  . Adopted   . Anxiety   . Asthma   . Bipolar disorder ([place])   . Borderline personality disorder ([place])   . Depression   . GERD (gastroesophageal reflux disease)   . Headache    Migraines  . PTSD (post-traumatic stress disorder)    . Suicide attempt [facility])     Current Outpatient Medications:  .  clonazePAM (KLONOPIN) 0.5 MG tablet, Take 1 tablet (0.5 mg total) by mouth 3 (three) times daily as needed. for anxiety, Disp: 180 tablet, Rfl: 3 .  prazosin (MINIPRESS) 2 MG capsule, Take 2 capsules (4 mg total) by mouth at bedtime., Disp: 180 capsule, Rfl: 3 .  sertraline (ZOLOFT) 50 MG tablet, Take 1 tablet (50 mg total) by mouth daily., Disp: 30 tablet, Rfl: 5 .  ziprasidone (GEODON) 60 MG capsule, Take 1 capsule (60 mg total) by mouth at bedtime., Disp: 90 capsule, Rfl: 3  Assessment/ Plan: 28 y.o. female   1. GAD (generalized anxiety disorder) - sertraline (ZOLOFT) 50 MG tablet; Take 1 tablet (50 mg total) by mouth daily.  Dispense: 30 tablet; Refill: 5  2. Depression, major, single episode, moderate ([facility]) - sertraline (ZOLOFT) 50 MG tablet; Take 1 tablet (50 mg total) by mouth daily.  Dispense: 30 tablet; Refill: 5  Call back for recheck in 4 weeks, okay to do by telephone  Start time: 10:22 AM End time: 10:32 AM  Meds ordered this encounter  Medications  . sertraline (ZOLOFT) 50  MG tablet    Sig: Take 1 tablet (50 mg total) by mouth daily.    Dispense:  30 tablet    Refill:  5    Order Specific Question:   Supervising Provider    Answer:   Janora Norlander [[id number]]    Particia Nearing PA-C [place] [phone]

## 2019-02-11 ENCOUNTER — Ambulatory Visit (INDEPENDENT_AMBULATORY_CARE_PROVIDER_SITE_OTHER): Payer: Medicaid Other | Admitting: Licensed Clinical Social Worker

## 2019-02-11 ENCOUNTER — Other Ambulatory Visit: Payer: Self-pay

## 2019-02-11 DIAGNOSIS — F332 Major depressive disorder, recurrent severe without psychotic features: Secondary | ICD-10-CM | POA: Diagnosis not present

## 2019-02-11 DIAGNOSIS — F4310 Post-traumatic stress disorder, unspecified: Secondary | ICD-10-CM | POA: Diagnosis not present
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
## 2019-02-25 ENCOUNTER — Other Ambulatory Visit: Payer: Self-pay

## 2019-02-25 ENCOUNTER — Ambulatory Visit (INDEPENDENT_AMBULATORY_CARE_PROVIDER_SITE_OTHER): Payer: Medicaid Other | Admitting: Licensed Clinical Social Worker

## 2019-02-25 DIAGNOSIS — F332 Major depressive disorder, recurrent severe without psychotic features: Secondary | ICD-10-CM

## 2019-02-25 NOTE — Progress Notes (Signed)
Virtual Visit via Telephone Note  I connected with Kelly Barry on 02/25/19 at  3:00 PM EDT by telephone and verified that I am speaking with the correct person using two identifiers. Client declined video due to worry about weight and having to see herself.   I discussed the limitations, risks, security and privacy concerns of performing an evaluation and management service by telephone and the availability of in person appointments. I also discussed with the patient that there may be a patient responsible charge related to this service. The patient expressed understanding and agreed to proceed.   I discussed the assessment and treatment plan with the patient. The patient was provided an opportunity to ask questions and all were answered. The patient agreed with the plan and demonstrated an understanding of the instructions.   The patient was advised to call back or seek an in-person evaluation if the symptoms worsen or if the condition fails to improve as anticipated.    THERAPIST PROGRESS NOTE  Session Time: 3pm-3:55pm  Participation Level: Active  Behavioral Response: NAAlertDepressed  Type of Therapy: Individual Therapy  Treatment Goals addressed: Coping  Interventions: CBT and Supportive  Summary: Kelly Barry is a 28 y.o. female who presents with symptoms of depression and anxiety. Client verbalized worry about grandfather, with whom she is very close, not being well and reports after her grandmothers death 3 years ago she had a suicide attempt. Client denies any current SI or desire to self harm. Client does report she minimized substance use during assessment, noting over the past 1-2 years she has been drinking as much as 6 shots nightly to sleep without having nightmares related to trauma as a child. Client reports she is currently 5 days sober and did endorse previous withdrawal symptoms of nausea, aches, cold sweat, and feelings of exhaustion. Client also reports she  has worry about her weight at 198 lbs for being 89f 11in and states she cannot look at herself without feeling disgusted. Client agrees that she will contact OBGYN to discuss weight gain related to birth control.   Client reports previously receiving EMDR with another counselor and is interested in returning to that counselor. Client notes until she can start EMDR for trauma symptoms she would like to continue here. Client declined scheduling with a psychiatrist, reporting wanting to stay with her current prescriber.   Suicidal/Homicidal: Nowithout intent/plan  Therapist Response: Clinician met with client via telehealth due to pandemic stay at home safety order, assessing for SI/HI/psychosis and overall level of functioning. Clinician inquired about changes in stressors. Clinician provided supportive listening and clarifying questions related to client changing report of ETOH use. Clinician provided psycho-education related to substance use and withdrawal. Clinician inquired about relationship with client and grandfather as well as exploring support of client boyfriend. Clinician encouraged client to add movement and exersice into her week at least 2 days per week and do something creative at least 2 times per week as client noted she was a creative person which made her happy before. Clinician encouraged client to use her creativity to engaged with her grandfather who lives in another state who she is concerned about.   Plan: Return again in 2 weeks.  Diagnosis: Axis I: Major Depression, Recurrent severe and Post Traumatic Stress Disorder    KOlegario Messier LCSW 02/25/2019

## 2019-03-02 ENCOUNTER — Other Ambulatory Visit: Payer: Self-pay | Admitting: Physician Assistant

## 2019-03-02 DIAGNOSIS — F4310 Post-traumatic stress disorder, unspecified: Secondary | ICD-10-CM

## 2019-03-10 ENCOUNTER — Encounter: Payer: Self-pay | Admitting: Physician Assistant

## 2019-03-10 ENCOUNTER — Ambulatory Visit (INDEPENDENT_AMBULATORY_CARE_PROVIDER_SITE_OTHER): Payer: Medicaid Other | Admitting: Physician Assistant

## 2019-03-10 ENCOUNTER — Other Ambulatory Visit: Payer: Self-pay

## 2019-03-10 DIAGNOSIS — F4310 Post-traumatic stress disorder, unspecified: Secondary | ICD-10-CM | POA: Diagnosis not present

## 2019-03-10 DIAGNOSIS — F411 Generalized anxiety disorder: Secondary | ICD-10-CM

## 2019-03-10 DIAGNOSIS — F321 Major depressive disorder, single episode, moderate: Secondary | ICD-10-CM | POA: Insufficient documentation

## 2019-03-10 MED ORDER — CLONAZEPAM 0.5 MG PO TABS: 0.5000 mg | ORAL_TABLET | Freq: Three times a day (TID) | ORAL | 5 refills | Status: DC | PRN

## 2019-03-10 NOTE — Progress Notes (Signed)
Telephone visit  Subjective: CC: Recheck the following depression, anxiety, posttraumatic stress disorder PCP: Terald Sleeper, PA-C [name] is a 28 y.o. female calls for telephone consult today. Patient provides verbal consent for consult held via phone.  Patient is identified with 2 separate identifiers.  At this time the entire area is on COVID-19 social distancing and stay home orders are in place.  Patient is of higher risk and therefore we are performing this by a virtual method.  Location of patient: Home Location of provider: [facility] Others present for call: No  This is a periodic recheck on the patient's chronic medical conditions that do include depression, anxiety, posttraumatic stress disorder.  She is currently working with a Social worker every 3 weeks.  They are doing it by tele-visit.  She is not anxious about the COVID-19.  She has been able to stay home and be safe.  She states overall she is very well controlled with her current regimen.  And she does need refills.  She states that she is due a physical exam this summer.  Have asked her to give [place] a call back and schedule that for the summer.  We can recheck her in 5 months for her depression and anxiety.  She has no other complaints at this time.   ROS: Per HPI  Allergies  Allergen Reactions  . Diclofenac Anaphylaxis and Rash  . Hydroxyzine Other (See Comments)    Headaches....fevers  . Icy Hot Rash   Past Medical History:  Diagnosis Date  . Adopted   . Anxiety   . Asthma   . Bipolar disorder ([place])   . Borderline personality disorder ([place])   . Depression   . GERD (gastroesophageal reflux disease)   . Headache    Migraines  . PTSD (post-traumatic stress disorder)   . Suicide attempt [facility])     Current Outpatient Medications:  .  clonazePAM (KLONOPIN) 0.5 MG tablet, Take 1 tablet (0.5 mg total) by mouth 3 (three) times daily as needed. for anxiety, Disp: 90 tablet, Rfl: 5 .  prazosin (MINIPRESS)  2 MG capsule, Take 2 capsules (4 mg total) by mouth at bedtime., Disp: 180 capsule, Rfl: 3 .  sertraline (ZOLOFT) 50 MG tablet, Take 1 tablet (50 mg total) by mouth daily., Disp: 30 tablet, Rfl: 5 .  ziprasidone (GEODON) 60 MG capsule, Take 1 capsule (60 mg total) by mouth at bedtime., Disp: 90 capsule, Rfl: 3  Assessment/ Plan: 28 y.o. female   1. PTSD (post-traumatic stress disorder) - clonazePAM (KLONOPIN) 0.5 MG tablet; Take 1 tablet (0.5 mg total) by mouth 3 (three) times daily as needed. for anxiety  Dispense: 90 tablet; Refill: 5  2. GAD (generalized anxiety disorder) - clonazePAM (KLONOPIN) 0.5 MG tablet; Take 1 tablet (0.5 mg total) by mouth 3 (three) times daily as needed. for anxiety  Dispense: 90 tablet; Refill: 5  3. Depression, major, single episode, moderate (HCC) Continue maintenance medications Zoloft Geodon Prazosin   Start time: 8:34 AM End time: 8:43 AM  Meds ordered this encounter  Medications  . clonazePAM (KLONOPIN) 0.5 MG tablet    Sig: Take 1 tablet (0.5 mg total) by mouth 3 (three) times daily as needed. for anxiety    Dispense:  90 tablet    Refill:  5    Order Specific Question:   Supervising Provider    Answer:   Janora Norlander [[id number]]    [name]

## 2019-03-11 ENCOUNTER — Other Ambulatory Visit: Payer: Self-pay

## 2019-03-11 ENCOUNTER — Ambulatory Visit (INDEPENDENT_AMBULATORY_CARE_PROVIDER_SITE_OTHER): Payer: Medicaid Other | Admitting: Licensed Clinical Social Worker

## 2019-03-11 DIAGNOSIS — F4310 Post-traumatic stress disorder, unspecified: Secondary | ICD-10-CM
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
## 2019-04-16 DIAGNOSIS — U071 COVID-19: Secondary | ICD-10-CM | POA: Diagnosis not present

## 2019-04-16 DIAGNOSIS — R0689 Other abnormalities of breathing: Secondary | ICD-10-CM | POA: Diagnosis not present

## 2019-04-16 DIAGNOSIS — Z209 Contact with and (suspected) exposure to unspecified communicable disease: Secondary | ICD-10-CM | POA: Diagnosis not present

## 2019-04-16 DIAGNOSIS — R000 Tachycardia, unspecified: Secondary | ICD-10-CM | POA: Diagnosis not present

## 2019-04-16 DIAGNOSIS — R52 Pain, unspecified: Secondary | ICD-10-CM | POA: Diagnosis not present

## 2019-04-16 DIAGNOSIS — R51 Headache: Secondary | ICD-10-CM | POA: Diagnosis not present

## 2019-04-28 ENCOUNTER — Inpatient Hospital Stay (HOSPITAL_COMMUNITY)
Admission: AD | Admit: 2019-04-28 | Discharge: 2019-04-30 | DRG: 177 | Disposition: A | Discharge status: Home / Self Care | Payer: Medicaid Other | Source: Other Acute Inpatient Hospital | Attending: Internal Medicine | Admitting: Internal Medicine

## 2019-04-28 ENCOUNTER — Other Ambulatory Visit: Payer: Self-pay

## 2019-04-28 ENCOUNTER — Encounter (HOSPITAL_COMMUNITY): Payer: Self-pay

## 2019-04-28 DIAGNOSIS — R5381 Other malaise: Secondary | ICD-10-CM | POA: Diagnosis not present

## 2019-04-28 DIAGNOSIS — Z6841 Body Mass Index (BMI) 40.0 and over, adult: Secondary | ICD-10-CM

## 2019-04-28 DIAGNOSIS — R279 Unspecified lack of coordination: Secondary | ICD-10-CM | POA: Diagnosis not present

## 2019-04-28 DIAGNOSIS — R740 Nonspecific elevation of levels of transaminase and lactic acid dehydrogenase [LDH]: Secondary | ICD-10-CM | POA: Diagnosis present

## 2019-04-28 DIAGNOSIS — F319 Bipolar disorder, unspecified: Secondary | ICD-10-CM | POA: Diagnosis present

## 2019-04-28 DIAGNOSIS — J1282 Pneumonia due to coronavirus disease 2019: Secondary | ICD-10-CM | POA: Diagnosis present

## 2019-04-28 DIAGNOSIS — Z743 Need for continuous supervision: Secondary | ICD-10-CM | POA: Diagnosis not present

## 2019-04-28 DIAGNOSIS — J4520 Mild intermittent asthma, uncomplicated: Secondary | ICD-10-CM | POA: Diagnosis not present

## 2019-04-28 DIAGNOSIS — F419 Anxiety disorder, unspecified: Secondary | ICD-10-CM | POA: Diagnosis present

## 2019-04-28 DIAGNOSIS — E669 Obesity, unspecified: Secondary | ICD-10-CM | POA: Diagnosis present

## 2019-04-28 DIAGNOSIS — U071 COVID-19: Secondary | ICD-10-CM | POA: Diagnosis not present

## 2019-04-28 DIAGNOSIS — R7401 Elevation of levels of liver transaminase levels: Secondary | ICD-10-CM

## 2019-04-28 DIAGNOSIS — F39 Unspecified mood [affective] disorder: Secondary | ICD-10-CM | POA: Diagnosis present

## 2019-04-28 DIAGNOSIS — J1289 Other viral pneumonia: Secondary | ICD-10-CM

## 2019-04-28 DIAGNOSIS — R0602 Shortness of breath: Secondary | ICD-10-CM

## 2019-04-28 DIAGNOSIS — J45909 Unspecified asthma, uncomplicated: Secondary | ICD-10-CM

## 2019-04-28 DIAGNOSIS — J9601 Acute respiratory failure with hypoxia: Secondary | ICD-10-CM | POA: Diagnosis present

## 2019-04-28 NOTE — Plan of Care (Signed)
Care plan discussed with pt and initiated.

## 2019-04-28 NOTE — H&P (Addendum)
History and Physical  Patient Name: Kelly Barry     SWF:[id number]    DOB: 06-18-1991    DOA: 04/28/2019 PCP: Terald Sleeper, PA-C  Patient coming from: [facility] ER  Chief Complaint: Dyspnea      HPI: Kelly Barry is a 28 y.o. F with hx obesity, asthma, and depression who presents with cough, now dyspneic.  Patient developed flu-like symptoms, cough, malaise, aches and fever a few days ago.  Tested positive for COVID, and was self-isolating but today started to feel out of breath, so came to the ER.  In the ER, she was in the mid-80s on room air, stabilized on 2L.  CXR showed multilobar pneumonia.  Got albuterol and Solu-medrol and [facility] were asked to accept in trasnfer for COVID-19.  Hemogram normal, K slightly low after albuterol but renal function normal.      ROS: Review of Systems  Constitutional: Positive for fever and malaise/fatigue.  Respiratory: Positive for cough and shortness of breath.   All other systems reviewed and are negative.         Past Medical History:  Diagnosis Date  . Adopted   . Anxiety   . Asthma   . Bipolar disorder ([place])   . Borderline personality disorder ([place])   . Depression   . GERD (gastroesophageal reflux disease)   . Headache    Migraines  . PTSD (post-traumatic stress disorder)   . Suicide attempt [facility])     Past Surgical History:  Procedure Laterality Date  . DILATION AND EVACUATION N/A 01/02/2018   Procedure: ULTRASOUND GUIDED DILATATION AND EVACUATION;  Surgeon: Servando Salina, MD;  Location: [place] [facility];  Service: Gynecology;  Laterality: N/A;  . ESOPHAGOGASTRODUODENOSCOPY  06/25/2012   Procedure: ESOPHAGOGASTRODUODENOSCOPY (EGD);  Surgeon: Arta Silence, MD;  Location: [facility];  Service: Endoscopy;  Laterality: N/A;  [name]/ebp  . GANGLION CYST EXCISION    . HYSTEROSCOPY N/A 10/20/2015   Procedure: HYSTEROSCOPY;  Surgeon: Janyth Pupa, DO;  Location: [place] [facility];  Service: Gynecology;  Laterality: N/A;  .  IUD REMOVAL N/A 10/20/2015   Procedure: INTRAUTERINE DEVICE (IUD) REMOVAL;  Surgeon: Janyth Pupa, DO;  Location: [facility];  Service: Gynecology;  Laterality: N/A;    Social History: Patient lives with her boyfriend.  The patient walks unassisted.  Nonsmoker.  On disability.  Allergies  Allergen Reactions  . Diclofenac Anaphylaxis and Rash  . Hydroxyzine Other (See Comments)    Headaches....fevers  . Icy Hot Rash    Family history: family history is not on file. She was adopted.  Prior to Admission medications   Medication Sig Start Date End Date Taking? Authorizing Provider  clonazePAM (KLONOPIN) 0.5 MG tablet Take 1 tablet (0.5 mg total) by mouth 3 (three) times daily as needed. for anxiety 03/10/19   Terald Sleeper, PA-C  prazosin (MINIPRESS) 2 MG capsule Take 2 capsules (4 mg total) by mouth at bedtime. 08/11/18   Terald Sleeper, PA-C  sertraline (ZOLOFT) 50 MG tablet Take 1 tablet (50 mg total) by mouth daily. 02/03/19   Terald Sleeper, PA-C  ziprasidone (GEODON) 60 MG capsule Take 1 capsule (60 mg total) by mouth at bedtime. 08/11/18   Terald Sleeper, PA-C       Physical Exam: BP 123/85 (BP Location: Right Arm)   Pulse (!) 107   Temp 98.6 F (37 C) (Oral)   Ht 4\' 11"  (1.499 m)   Wt 108.4 kg   SpO2 93%   BMI 48.27 kg/m  General appearance: Well-developed, adult female, alert and in no acute distress.   Eyes: Anicteric, conjunctiva pink, lids and lashes normal. PERRL.    ENT: No nasal deformity, discharge, epistaxis.  Hearing normal. OP moist without lesions.   Neck: No neck masses.  Trachea midline.  No thyromegaly/tenderness. Lymph: No cervical or supraclavicular lymphadenopathy. Skin: Warm and dry.  No jaundice.  No suspicious rashes or lesions. Cardiac: RRR, nl S1-S2, no murmurs appreciated.  Capillary refill is brisk.  JVP normal.  No LE edema.  Radial pulses 2+ and symmetric. Respiratory: Tachypneic but unlabored.  CTAB without rales or wheezes. Abdomen: Abdomen  soft.  No TTP or guarding. No ascites, distension, hepatosplenomegaly.   MSK: No deformities or effusions of the large joints of the upper or lower extremities bilaterally.  No cyanosis or clubbing. Neuro: Cranial nerves normal.  Sensation intact to light touch. Speech is fluent.  Muscle strength normal.    Psych: Sensorium intact and responding to questions, attention normal.  Behavior appropriate.  Affect normal.  Judgment and insight appear normal.     Labs on Admission:  I have personally reviewed the outside labs which were summarized above.       Radiological Exams on Admission: Personally reviewed CXR report described multifocal pneumonia   EKG: Independently reviewed. Heart rate 100, QTc 405, no ishcemic changes.        Assessment/Plan    Coronavirus pneumonitis with acute hypoxic respiratory failure In setting of ongoing 2020 COVID-19 pandemic.  Minimal O2 requirement.  Will observe off O2 for the next few hours and if has intermittent or worsening O2 requirement, will start remdesivir and steroids.   -VTE PPx with Lovenox -Continue Zinc and Vitamin C -Obtain d-dimer, ferritin and CRP     Transaminitis From COVID -Trend LFTs  Asthma, uncomplicated Only on rescue inhaler.  No active wheezing  Mood disorder -Continue Geodon, clonazepam      DVT prophylaxis: Lovenox  Code Status: FULL  Family Communication: None  Disposition Plan: Anticipate Monitoring overnight.  If with persistnet O2 requirement, will start remdesivir and monitor. Consults called: None Admission status: OBS   At the point of initial evaluation, it is my clinical opinion that admission for OBSERVATION is reasonable and necessary because the patient's presenting complaints in the context of their chronic conditions represent sufficient risk of deterioration or significant morbidity to constitute reasonable grounds for close observation in the hospital setting, but that the patient  may be medically stable for discharge from the hospital within 24 to 48 hours.     Medical decision making: Patient seen at 10:30 PM on 04/28/2019.  The patient was discussed with Dr. Frederich Chick.  What exists of the patient's chart was reviewed in depth and summarized above.  Clinical condition: stable on 2L.        [place] [facility] Pager: please page via [email or web address]

## 2019-04-29 ENCOUNTER — Observation Stay (HOSPITAL_COMMUNITY): Payer: Medicaid Other

## 2019-04-29 ENCOUNTER — Encounter (HOSPITAL_COMMUNITY): Payer: Self-pay | Admitting: Family Medicine

## 2019-04-29 DIAGNOSIS — R740 Nonspecific elevation of levels of transaminase and lactic acid dehydrogenase [LDH]: Secondary | ICD-10-CM | POA: Diagnosis not present

## 2019-04-29 DIAGNOSIS — F419 Anxiety disorder, unspecified: Secondary | ICD-10-CM | POA: Diagnosis not present

## 2019-04-29 DIAGNOSIS — J9601 Acute respiratory failure with hypoxia: Secondary | ICD-10-CM | POA: Diagnosis not present

## 2019-04-29 DIAGNOSIS — R7401 Elevation of levels of liver transaminase levels: Secondary | ICD-10-CM

## 2019-04-29 DIAGNOSIS — F319 Bipolar disorder, unspecified: Secondary | ICD-10-CM | POA: Diagnosis not present

## 2019-04-29 DIAGNOSIS — J45909 Unspecified asthma, uncomplicated: Secondary | ICD-10-CM | POA: Diagnosis not present

## 2019-04-29 DIAGNOSIS — Z6841 Body Mass Index (BMI) 40.0 and over, adult: Secondary | ICD-10-CM | POA: Diagnosis not present

## 2019-04-29 DIAGNOSIS — E669 Obesity, unspecified: Secondary | ICD-10-CM | POA: Diagnosis not present

## 2019-04-29 DIAGNOSIS — J1289 Other viral pneumonia: Secondary | ICD-10-CM | POA: Diagnosis not present

## 2019-04-29 DIAGNOSIS — U071 COVID-19: Secondary | ICD-10-CM | POA: Diagnosis present

## 2019-04-29 DIAGNOSIS — J988 Other specified respiratory disorders: Secondary | ICD-10-CM | POA: Diagnosis not present

## 2019-04-29 DIAGNOSIS — J4520 Mild intermittent asthma, uncomplicated: Secondary | ICD-10-CM | POA: Diagnosis not present

## 2019-04-29 DIAGNOSIS — J1282 Pneumonia due to coronavirus disease 2019: Secondary | ICD-10-CM | POA: Diagnosis present

## 2019-04-29 LAB — CBC WITH DIFFERENTIAL/PLATELET
Abs Immature Granulocytes: 0.13 10*3/uL — ABNORMAL HIGH (ref 0.00–0.07)
Basophils Absolute: 0 10*3/uL (ref 0.0–0.1)
Basophils Relative: 0 %
Eosinophils Absolute: 0 10*3/uL (ref 0.0–0.5)
Eosinophils Relative: 0 %
HCT: 43 % (ref 36.0–46.0)
Hemoglobin: 14.5 g/dL (ref 12.0–15.0)
Immature Granulocytes: 1 %
Lymphocytes Relative: 18 %
Lymphs Abs: 1.7 10*3/uL (ref 0.7–4.0)
MCH: 30.9 pg (ref 26.0–34.0)
MCHC: 33.7 g/dL (ref 30.0–36.0)
MCV: 91.5 fL (ref 80.0–100.0)
Monocytes Absolute: 0.3 10*3/uL (ref 0.1–1.0)
Monocytes Relative: 3 %
Neutro Abs: 7.4 10*3/uL (ref 1.7–7.7)
Neutrophils Relative %: 78 %
Platelets: 633 10*3/uL — ABNORMAL HIGH (ref 150–400)
RBC: 4.7 MIL/uL (ref 3.87–5.11)
RDW: 12.4 % (ref 11.5–15.5)
WBC: 9.6 10*3/uL (ref 4.0–10.5)
nRBC: 0.3 % — ABNORMAL HIGH (ref 0.0–0.2)

## 2019-04-29 LAB — COMPREHENSIVE METABOLIC PANEL
ALT: 45 U/L — ABNORMAL HIGH (ref 0–44)
AST: 51 U/L — ABNORMAL HIGH (ref 15–41)
Albumin: 3.5 g/dL (ref 3.5–5.0)
Alkaline Phosphatase: 67 U/L (ref 38–126)
Anion gap: 12 (ref 5–15)
BUN: 12 mg/dL (ref 6–20)
CO2: 20 mmol/L — ABNORMAL LOW (ref 22–32)
Calcium: 9.9 mg/dL (ref 8.9–10.3)
Chloride: 106 mmol/L (ref 98–111)
Creatinine, Ser: 0.56 mg/dL (ref 0.44–1.00)
GFR calc Af Amer: >60 mL/min (ref >60)
GFR calc non Af Amer: >60 mL/min (ref >60)
Glucose, Bld: 241 mg/dL — ABNORMAL HIGH (ref 70–99)
Potassium: 3.9 mmol/L (ref 3.5–5.1)
Sodium: 138 mmol/L (ref 135–145)
Total Bilirubin: 0.6 mg/dL (ref 0.3–1.2)
Total Protein: 7.8 g/dL (ref 6.5–8.1)

## 2019-04-29 LAB — C-REACTIVE PROTEIN: CRP: 1.8 mg/dL — ABNORMAL HIGH (ref <1.0)

## 2019-04-29 LAB — PREGNANCY, URINE: Preg Test, Ur: NEGATIVE

## 2019-04-29 LAB — D-DIMER, QUANTITATIVE: D-Dimer, Quant: 1.28 ug/mL-FEU — ABNORMAL HIGH (ref 0.00–0.50)

## 2019-04-29 LAB — MAGNESIUM: Magnesium: 2.5 mg/dL — ABNORMAL HIGH (ref 1.7–2.4)

## 2019-04-29 LAB — HIV ANTIBODY (ROUTINE TESTING W REFLEX): HIV Screen 4th Generation wRfx: NONREACTIVE

## 2019-04-29 MED ORDER — ONDANSETRON HCL 4 MG PO TABS: 4.0000 mg | ORAL_TABLET | Freq: Four times a day (QID) | ORAL | Status: DC | PRN

## 2019-04-29 MED ORDER — PRAZOSIN HCL 1 MG PO CAPS
4.0000 mg | ORAL_CAPSULE | Freq: Every day | ORAL | Status: DC
  Administered 2019-04-29 (×2): 4 mg via ORAL
  Filled 2019-04-29 (×2): qty 4

## 2019-04-29 MED ORDER — HYDROCOD POLST-CPM POLST ER 10-8 MG/5ML PO SUER
5.0000 mL | Freq: Two times a day (BID) | ORAL | Status: DC | PRN
  Administered 2019-04-29 (×2): 5 mL via ORAL
  Filled 2019-04-29 (×2): qty 5

## 2019-04-29 MED ORDER — GUAIFENESIN-DM 100-10 MG/5ML PO SYRP
10.0000 mL | ORAL_SOLUTION | ORAL | Status: DC | PRN
  Administered 2019-04-29: 10 mL via ORAL
  Filled 2019-04-29: qty 10

## 2019-04-29 MED ORDER — ENOXAPARIN SODIUM 40 MG/0.4ML ~~LOC~~ SOLN
40.0000 mg | SUBCUTANEOUS | Status: DC
  Administered 2019-04-29 – 2019-04-30 (×2): 40 mg via SUBCUTANEOUS
  Filled 2019-04-29 (×3): qty 0.4

## 2019-04-29 MED ORDER — CLONAZEPAM 0.5 MG PO TABS
0.5000 mg | ORAL_TABLET | Freq: Three times a day (TID) | ORAL | Status: DC | PRN
  Administered 2019-04-29 (×2): 0.5 mg via ORAL
  Filled 2019-04-29 (×2): qty 1

## 2019-04-29 MED ORDER — DEXAMETHASONE SODIUM PHOSPHATE 10 MG/ML IJ SOLN
6.0000 mg | INTRAMUSCULAR | Status: DC
  Administered 2019-04-29: 6 mg via INTRAVENOUS
  Filled 2019-04-29: qty 1

## 2019-04-29 MED ORDER — DEXAMETHASONE SODIUM PHOSPHATE 10 MG/ML IJ SOLN: 6.0000 mg | Freq: Two times a day (BID) | INTRAMUSCULAR | Status: DC

## 2019-04-29 MED ORDER — ONDANSETRON HCL 4 MG/2ML IJ SOLN: 4.0000 mg | Freq: Four times a day (QID) | INTRAMUSCULAR | Status: DC | PRN

## 2019-04-29 MED ORDER — ACETAMINOPHEN 325 MG PO TABS: 650.0000 mg | ORAL_TABLET | Freq: Four times a day (QID) | ORAL | Status: DC | PRN

## 2019-04-29 MED ORDER — PRAZOSIN HCL 1 MG PO CAPS: 4.0000 mg | ORAL_CAPSULE | Freq: Every day | ORAL | Status: DC

## 2019-04-29 MED ORDER — ZIPRASIDONE HCL 60 MG PO CAPS
60.0000 mg | ORAL_CAPSULE | Freq: Every day | ORAL | Status: DC
  Administered 2019-04-29 (×2): 60 mg via ORAL
  Filled 2019-04-29 (×2): qty 1

## 2019-04-29 MED ORDER — ZIPRASIDONE HCL 60 MG PO CAPS: 60.0000 mg | ORAL_CAPSULE | Freq: Every day | ORAL | Status: DC

## 2019-04-29 MED ORDER — ALBUTEROL SULFATE HFA 108 (90 BASE) MCG/ACT IN AERS
1.0000 | INHALATION_SPRAY | RESPIRATORY_TRACT | Status: DC | PRN
Start: 2019-04-29 — End: 2019-04-30
  Filled 2019-04-29: qty 6.7

## 2019-04-29 NOTE — Progress Notes (Signed)
Patient up in chair for all meals and able to transfer from bed to chair and ambulate to BR without difficulty.  Now on room air, oxygen saturations 88-94%.  Using IS and flutter valve, but continued instruction needed.

## 2019-04-29 NOTE — Progress Notes (Signed)
Telephone call to patient's mother, Morrisa Aldaba, updated on plan of care for shift and answered all questions.

## 2019-04-29 NOTE — Progress Notes (Signed)
PROGRESS NOTE                                                                                                                                                                                                             Patient Demographics:    Kelly Barry, is a 28 y.o. female, DOB - Aug 18, 1991, VQX:[id number]  Outpatient Primary MD for the patient is Terald Sleeper, PA-C    LOS - 1  Admit date - 04/28/2019    CC - SOB     Brief Narrative Kelly Barry is a 28 y.o. F with hx obesity, asthma, and depression who presents with cough, now dyspneic. Patient developed flu-like symptoms, cough, malaise, aches and fever a few days ago.  Tested positive for COVID, and was self-isolating but today started to feel out of breath, so came to the ER.   Subjective:    Kelly Barry today has, No headache, No chest pain, No abdominal pain - No Nausea, No new weakness tingling or numbness, +ve Cough - SOB.     Assessment  & Plan :     1. Acute Covid 19 Viral Pneumonitis during the ongoing 2020 Covid 19 Pandemic - minimal to no hypoxia, when sitting in chair pulse ox is 95% on room air, chest x-ray did show infiltrate in the outside facility.  Inflammatory markers are borderline, will place on low-dose steroid and monitor.  COVID-19 Labs  Recent Labs    04/29/19 0930  DDIMER 1.28*  CRP 1.8*    No results found for: SARSCOV2NAA   Hepatic Function Latest Ref Rng & Units 04/29/2019 07/04/2017 01/30/2017  Total Protein 6.5 - 8.1 g/dL 7.8 7.1 7.6  Albumin 3.5 - 5.0 g/dL 3.5 3.9 4.0  AST 15 - 41 U/L 51(H) 20 15  ALT 0 - 44 U/L 45(H) 15 12(L)  Alk Phosphatase 38 - 126 U/L 67 49 64  Total Bilirubin 0.3 - 1.2 mg/dL 0.6 0.7 0.5     No results found for: BNP    2.  History of asthma.  Currently stable.  No wheezing.  3.  Mild COVID-19 related transaminitis.  Stable.  No abdominal pain.  Trend.  4. Mood disorder - Continue Geodon,  clonazepam    Condition - Fair  Family Communication  :  None  Code Status :  Full  Diet :   Diet Order            Diet regular Room service appropriate? Yes; Fluid consistency: Thin  Diet effective now               Disposition Plan  :  Home 1-2 days  Consults  :  No  Procedures  :    PUD Prophylaxis :    DVT Prophylaxis  :  Lovenox   Lab Results  Component Value Date   PLT 633 (H) 04/29/2019    Inpatient Medications  Scheduled Meds: . enoxaparin (LOVENOX) injection  40 mg Subcutaneous Q24H  . prazosin  4 mg Oral QHS  . ziprasidone  60 mg Oral QHS   Continuous Infusions: PRN Meds:.acetaminophen, albuterol, chlorpheniramine-HYDROcodone, clonazePAM, guaiFENesin-dextromethorphan, ondansetron **OR** ondansetron (ZOFRAN) IV  Antibiotics  :    Anti-infectives (From admission, onward)   None       Time Spent in minutes  30   Lala Lund M.D on 04/29/2019 at 10:51 AM  To page go to [email or web address] - password [facility]  [facility] -  Office  [phone]  See all Orders from today for further details    Objective:   Vitals:   04/28/19 2215 04/29/19 0754  BP: 123/85 116/78  Pulse: (!) 107 69  Resp: 20   Temp: 98.6 F (37 C) 97.6 F (36.4 C)  TempSrc: Oral Oral  SpO2: 93% 93%  Weight: 108.4 kg   Height: '4\' 11"'  (1.499 m)     Wt Readings from Last 3 Encounters:  04/28/19 108.4 kg  08/11/18 87.9 kg  02/19/18 80 kg     Intake/Output Summary (Last 24 hours) at 04/29/2019 1051 Last data filed at 04/29/2019 0900 Gross per 24 hour  Intake 360 ml  Output -  Net 360 ml     Physical Exam  Awake Alert, Oriented X 3, No new F.N deficits, Normal affect [place].AT,PERRAL Supple Neck,No JVD, No cervical lymphadenopathy appriciated.  Symmetrical Chest wall movement, Good air movement bilaterally, CTAB RRR,No Gallops,Rubs or new Murmurs, No Parasternal Heave +ve B.Sounds, Abd Soft, No tenderness, No organomegaly appriciated, No rebound - guarding or  rigidity. No Cyanosis, Clubbing or edema, No new Rash or bruise       Data Review:    CBC Recent Labs  Lab 04/29/19 0930  WBC 9.6  HGB 14.5  HCT 43.0  PLT 633*  MCV 91.5  MCH 30.9  MCHC 33.7  RDW 12.4  LYMPHSABS 1.7  MONOABS 0.3  EOSABS 0.0  BASOSABS 0.0    Chemistries  Recent Labs  Lab 04/29/19 0930  NA 138  K 3.9  CL 106  CO2 20*  GLUCOSE 241*  BUN 12  CREATININE 0.56  CALCIUM 9.9  MG 2.5*  AST 51*  ALT 45*  ALKPHOS 67  BILITOT 0.6   ------------------------------------------------------------------------------------------------------------------ No results for input(s): CHOL, HDL, LDLCALC, TRIG, CHOLHDL, LDLDIRECT in the last 72 hours.  Lab Results  Component Value Date   HGBA1C 5.2 03/29/2016   ------------------------------------------------------------------------------------------------------------------ No results for input(s): TSH, T4TOTAL, T3FREE, THYROIDAB in the last 72 hours.  Invalid input(s): FREET3  Cardiac Enzymes No results for input(s): CKMB, TROPONINI, MYOGLOBIN in the last 168 hours.  Invalid input(s): CK ------------------------------------------------------------------------------------------------------------------ No results found for: BNP  Micro Results No results found for this or any previous visit (from the past 240 hour(s)).  Radiology Reports No results found.

## 2019-04-29 NOTE — Progress Notes (Addendum)
O2 Sats 96-97% on 3L.  O2 titrated to 2L sats still remain 96-97%.  O2 titrated to 1L sats 92% at this time.   0400    Tried weaning pt off O2 completely.  O2 sats on RA 86-89%.  Put pt back on 1L O2 sats back to 92-93%.  Pt has been laying in prone position most of the night.

## 2019-04-29 NOTE — Progress Notes (Signed)
Patient ambulated 250 feet around unit on room air.  Oxygen sats decreased to 83%, patient reports feeling SOB.  Returned to room and patient placed on 1L [place] sats increased to 90%.  Increased to 2L [place] for sats of 92-93% at this time. Patient  Coughing, states small amount of phlegm.

## 2019-04-30 LAB — CBC WITH DIFFERENTIAL/PLATELET
Abs Immature Granulocytes: 0.13 10*3/uL — ABNORMAL HIGH (ref 0.00–0.07)
Basophils Absolute: 0 10*3/uL (ref 0.0–0.1)
Basophils Relative: 0 %
Eosinophils Absolute: 0 10*3/uL (ref 0.0–0.5)
Eosinophils Relative: 0 %
HCT: 42.3 % (ref 36.0–46.0)
Hemoglobin: 14.1 g/dL (ref 12.0–15.0)
Immature Granulocytes: 1 %
Lymphocytes Relative: 17 %
Lymphs Abs: 2.3 10*3/uL (ref 0.7–4.0)
MCH: 31 pg (ref 26.0–34.0)
MCHC: 33.3 g/dL (ref 30.0–36.0)
MCV: 93 fL (ref 80.0–100.0)
Monocytes Absolute: 0.9 10*3/uL (ref 0.1–1.0)
Monocytes Relative: 6 %
Neutro Abs: 9.9 10*3/uL — ABNORMAL HIGH (ref 1.7–7.7)
Neutrophils Relative %: 76 %
Platelets: 654 10*3/uL — ABNORMAL HIGH (ref 150–400)
RBC: 4.55 MIL/uL (ref 3.87–5.11)
RDW: 12.7 % (ref 11.5–15.5)
WBC: 13.2 10*3/uL — ABNORMAL HIGH (ref 4.0–10.5)
nRBC: 0 % (ref 0.0–0.2)

## 2019-04-30 LAB — LACTATE DEHYDROGENASE: LDH: 328 U/L — ABNORMAL HIGH (ref 98–192)

## 2019-04-30 LAB — COMPREHENSIVE METABOLIC PANEL
ALT: 47 U/L — ABNORMAL HIGH (ref 0–44)
AST: 53 U/L — ABNORMAL HIGH (ref 15–41)
Albumin: 3.5 g/dL (ref 3.5–5.0)
Alkaline Phosphatase: 66 U/L (ref 38–126)
Anion gap: 15 (ref 5–15)
BUN: 14 mg/dL (ref 6–20)
CO2: 22 mmol/L (ref 22–32)
Calcium: 9.7 mg/dL (ref 8.9–10.3)
Chloride: 103 mmol/L (ref 98–111)
Creatinine, Ser: 0.57 mg/dL (ref 0.44–1.00)
GFR calc Af Amer: >60 mL/min (ref >60)
GFR calc non Af Amer: >60 mL/min (ref >60)
Glucose, Bld: 127 mg/dL — ABNORMAL HIGH (ref 70–99)
Potassium: 3.6 mmol/L (ref 3.5–5.1)
Sodium: 140 mmol/L (ref 135–145)
Total Bilirubin: 0.4 mg/dL (ref 0.3–1.2)
Total Protein: 7.3 g/dL (ref 6.5–8.1)

## 2019-04-30 LAB — FERRITIN
Ferritin: 272 ng/mL (ref 11–307)
Ferritin: 283 ng/mL (ref 11–307)

## 2019-04-30 LAB — D-DIMER, QUANTITATIVE: D-Dimer, Quant: 0.68 ug/mL-FEU — ABNORMAL HIGH (ref 0.00–0.50)

## 2019-04-30 LAB — C-REACTIVE PROTEIN: CRP: <0.8 mg/dL (ref <1.0)

## 2019-04-30 LAB — HEMOGLOBIN A1C
Hgb A1c MFr Bld: 6.2 % — ABNORMAL HIGH (ref 4.8–5.6)
Mean Plasma Glucose: 131 mg/dL

## 2019-04-30 LAB — MAGNESIUM: Magnesium: 2.5 mg/dL — ABNORMAL HIGH (ref 1.7–2.4)

## 2019-04-30 MED ORDER — ALBUTEROL SULFATE HFA 108 (90 BASE) MCG/ACT IN AERS: 2.0000 | INHALATION_SPRAY | Freq: Four times a day (QID) | RESPIRATORY_TRACT | 0 refills | Status: DC | PRN

## 2019-04-30 MED ORDER — PREDNISONE 5 MG PO TABS: ORAL_TABLET | ORAL | 0 refills | Status: DC

## 2019-04-30 NOTE — Progress Notes (Addendum)
Pt sleeping.  No s/sx of distress noted.  CLWR.  0355  Pt sleeping.  No s/sx of distress noted.  CLWR.

## 2019-04-30 NOTE — Discharge Instructions (Signed)
Follow with Primary MD Terald Sleeper, PA-C in 7 days   Get CBC, CMP, 2 view Chest X ray -  checked next visit within 1 week by Primary MD   Activity: As tolerated with Full fall precautions use walker/cane & assistance as needed  Disposition Home   Diet: Heart Healthy   Special Instructions: If you have smoked or chewed Tobacco  in the last 2 yrs please stop smoking, stop any regular Alcohol  and or any Recreational drug use.  On your next visit with your primary care physician please Get Medicines reviewed and adjusted.  Please request your Prim.MD to go over all Hospital Tests and Procedure/Radiological results at the follow up, please get all Hospital records sent to your Prim MD by signing hospital release before you go home.  If you experience worsening of your admission symptoms, develop shortness of breath, life threatening emergency, suicidal or homicidal thoughts you must seek medical attention immediately by calling 911 or calling your MD immediately  if symptoms less severe.  You Must read complete instructions/literature along with all the possible adverse reactions/side effects for all the Medicines you take and that have been prescribed to you. Take any new Medicines after you have completely understood and accpet all the possible adverse reactions/side effects.        Person Under Monitoring Name: Kelly Barry  Location: [address] [place] [address]   Infection Prevention Recommendations for Individuals Confirmed to have, or Being Evaluated for, 2019 Novel Coronavirus (COVID-19) Infection Who Receive Care at Home  Individuals who are confirmed to have, or are being evaluated for, COVID-19 should follow the prevention steps below until a healthcare provider or local or state health department says they can return to normal activities.  Stay home except to get medical care You should restrict activities outside your home, except for getting medical  care. Do not go to work, school, or public areas, and do not use public transportation or taxis.  Call ahead before visiting your doctor Before your medical appointment, call the healthcare provider and tell them that you have, or are being evaluated for, COVID-19 infection. This will help the healthcare providers office take steps to keep other people from getting infected. Ask your healthcare provider to call the local or state health department.  Monitor your symptoms Seek prompt medical attention if your illness is worsening (e.g., difficulty breathing). Before going to your medical appointment, call the healthcare provider and tell them that you have, or are being evaluated for, COVID-19 infection. Ask your healthcare provider to call the local or state health department.  Wear a facemask You should wear a facemask that covers your nose and mouth when you are in the same room with other people and when you visit a healthcare provider. People who live with or visit you should also wear a facemask while they are in the same room with you.  Separate yourself from other people in your home As much as possible, you should stay in a different room from other people in your home. Also, you should use a separate bathroom, if available.  Avoid sharing household items You should not share dishes, drinking glasses, cups, eating utensils, towels, bedding, or other items with other people in your home. After using these items, you should wash them thoroughly with soap and water.  Cover your coughs and sneezes Cover your mouth and nose with a tissue when you cough or sneeze, or you can cough  or sneeze into your sleeve. Throw used tissues in a lined trash can, and immediately wash your hands with soap and water for at least 20 seconds or use an alcohol-based hand rub.  Wash your [facility] your hands often and thoroughly with soap and water for at least 20 seconds. You can use an alcohol-based  hand sanitizer if soap and water are not available and if your hands are not visibly dirty. Avoid touching your eyes, nose, and mouth with unwashed hands.   Prevention Steps for Caregivers and Household Members of Individuals Confirmed to have, or Being Evaluated for, COVID-19 Infection Being Cared for in the Home  If you live with, or provide care at home for, a person confirmed to have, or being evaluated for, COVID-19 infection please follow these guidelines to prevent infection:  Follow healthcare providers instructions Make sure that you understand and can help the patient follow any healthcare provider instructions for all care.  Provide for the patients basic needs You should help the patient with basic needs in the home and provide support for getting groceries, prescriptions, and other personal needs.  Monitor the patients symptoms If they are getting sicker, call his or her medical provider and tell them that the patient has, or is being evaluated for, COVID-19 infection. This will help the healthcare providers office take steps to keep other people from getting infected. Ask the healthcare provider to call the local or state health department.  Limit the number of people who have contact with the patient  If possible, have only one caregiver for the patient.  Other household members should stay in another home or place of residence. If this is not possible, they should stay  in another room, or be separated from the patient as much as possible. Use a separate bathroom, if available.  Restrict visitors who do not have an essential need to be in the home.  Keep older adults, very young children, and other sick people away from the patient Keep older adults, very young children, and those who have compromised immune systems or chronic health conditions away from the patient. This includes people with chronic heart, lung, or kidney conditions, diabetes, and  cancer.  Ensure good ventilation Make sure that shared spaces in the home have good air flow, such as from an air conditioner or an opened window, weather permitting.  Wash your hands often  Wash your hands often and thoroughly with soap and water for at least 20 seconds. You can use an alcohol based hand sanitizer if soap and water are not available and if your hands are not visibly dirty.  Avoid touching your eyes, nose, and mouth with unwashed hands.  Use disposable paper towels to dry your hands. If not available, use dedicated cloth towels and replace them when they become wet.  Wear a facemask and gloves  Wear a disposable facemask at all times in the room and gloves when you touch or have contact with the patients blood, body fluids, and/or secretions or excretions, such as sweat, saliva, sputum, nasal mucus, vomit, urine, or feces.  Ensure the mask fits over your nose and mouth tightly, and do not touch it during use.  Throw out disposable facemasks and gloves after using them. Do not reuse.  Wash your hands immediately after removing your facemask and gloves.  If your personal clothing becomes contaminated, carefully remove clothing and launder. Wash your hands after handling contaminated clothing.  Place all used disposable facemasks, gloves, and other  waste in a lined container before disposing them with other household waste.  Remove gloves and wash your hands immediately after handling these items.  Do not share dishes, glasses, or other household items with the patient  Avoid sharing household items. You should not share dishes, drinking glasses, cups, eating utensils, towels, bedding, or other items with a patient who is confirmed to have, or being evaluated for, COVID-19 infection.  After the person uses these items, you should wash them thoroughly with soap and water.  Wash laundry thoroughly  Immediately remove and wash clothes or bedding that have blood, body  fluids, and/or secretions or excretions, such as sweat, saliva, sputum, nasal mucus, vomit, urine, or feces, on them.  Wear gloves when handling laundry from the patient.  Read and follow directions on labels of laundry or clothing items and detergent. In general, wash and dry with the warmest temperatures recommended on the label.  Clean all areas the individual has used often  Clean all touchable surfaces, such as counters, tabletops, doorknobs, bathroom fixtures, toilets, phones, keyboards, tablets, and bedside tables, every day. Also, clean any surfaces that may have blood, body fluids, and/or secretions or excretions on them.  Wear gloves when cleaning surfaces the patient has come in contact with.  Use a diluted bleach solution (e.g., dilute bleach with 1 part bleach and 10 parts water) or a household disinfectant with a label that says EPA-registered for coronaviruses. To make a bleach solution at home, add 1 tablespoon of bleach to 1 quart (4 cups) of water. For a larger supply, add  cup of bleach to 1 gallon (16 cups) of water.  Read labels of cleaning products and follow recommendations provided on product labels. Labels contain instructions for safe and effective use of the cleaning product including precautions you should take when applying the product, such as wearing gloves or eye protection and making sure you have good ventilation during use of the product.  Remove gloves and wash hands immediately after cleaning.  Monitor yourself for signs and symptoms of illness Caregivers and household members are considered close contacts, should monitor their health, and will be asked to limit movement outside of the home to the extent possible. Follow the monitoring steps for close contacts listed on the symptom monitoring form.   ? If you have additional questions, contact your local health department or call the epidemiologist on call at [phone] (available 24/7). ? This  guidance is subject to change. For the most up-to-date guidance from [facility], please refer to their website: [email or web address].pl

## 2019-04-30 NOTE — Discharge Summary (Signed)
Kelly Barry:[id number] DOB: [birth year] DOA: 04/28/2019  PCP: Terald Sleeper, PA-C  Admit date: 04/28/2019  Discharge date: 04/30/2019  Admitted From: Home   Disposition:  Home   Recommendations for Outpatient Follow-up:   Follow up with PCP in 1-2 weeks  PCP Please obtain BMP/CBC, 2 view CXR in 1week,  (see Discharge instructions)   PCP Please follow up on the following pending results:    Home Health: None   Equipment/Devices: None  Consultations: None  Discharge Condition: Stable    CODE STATUS: Full    Diet Recommendation: Heart Healthy    CC - Fever   Brief history of present illness from the day of admission and additional interim summary    Kelly Barry a 28 y.o.Fwith hx obesity, asthma, and depressionwho presents with cough, now dyspneic. Patient developed flu-like symptoms, cough, malaise, aches and fever a few days ago. Tested positive for COVID, and was self-isolating but today started to feel out of breath, so came to the ER.                                                                 Hospital Course   1. Acute Covid 19 Viral Pneumonitis during the ongoing 2020 Covid 19 Pandemic - completely no hypoxia at this time stable on room air was only treated with low-dose steroids, chest x-ray did show infiltrate in the outside facility. Inflammatory markers are borderline and trend improving, will be discharged home on low-dose steroid taper, written instructions provided follow with PCP in a week.  COVID-19 Labs  Recent Labs    04/29/19 0930 04/30/19 0300  DDIMER 1.28* 0.68*  FERRITIN  --  283   272  LDH  --  328*  CRP 1.8* <0.8    No results found for: SARSCOV2NAA   2.  History of asthma.  Currently stable.  No wheezing.  3.  Mild COVID-19 related transaminitis.   Stable.  No abdominal pain.  Trend.  4. Mood disorder - ContinueGeodon, clonazepam  Discharge diagnosis     Active Problems:   Mood disorder ([place])   COVID-19   Asthma   Transaminitis   Pneumonia due to COVID-19 virus    Discharge instructions    Discharge Instructions    Diet - low sodium heart healthy   Complete by: As directed    Discharge instructions   Complete by: As directed    Follow with Primary MD Terald Sleeper, PA-C in 7 days   Get CBC, CMP, 2 view Chest X ray -  checked next visit within 1 week by Primary MD   Activity: As tolerated with Full fall precautions use walker/cane & assistance as needed  Disposition Home   Diet: Heart Healthy   Special Instructions: If you have smoked or  chewed Tobacco  in the last 2 yrs please stop smoking, stop any regular Alcohol  and or any Recreational drug use.  On your next visit with your primary care physician please Get Medicines reviewed and adjusted.  Please request your Prim.MD to go over all Hospital Tests and Procedure/Radiological results at the follow up, please get all Hospital records sent to your Prim MD by signing hospital release before you go home.  If you experience worsening of your admission symptoms, develop shortness of breath, life threatening emergency, suicidal or homicidal thoughts you must seek medical attention immediately by calling 911 or calling your MD immediately  if symptoms less severe.  You Must read complete instructions/literature along with all the possible adverse reactions/side effects for all the Medicines you take and that have been prescribed to you. Take any new Medicines after you have completely understood and accpet all the possible adverse reactions/side effects.   Increase activity slowly   Complete by: As directed       Discharge Medications   Allergies as of 04/30/2019      Reactions   Diclofenac Anaphylaxis, Rash   Hydroxyzine Other (See Comments)    Headaches....fevers   Icy Hot Rash      Medication List    TAKE these medications   albuterol 108 (90 Base) MCG/ACT inhaler Commonly known as: VENTOLIN HFA Inhale 2 puffs into the lungs every 6 (six) hours as needed for wheezing or shortness of breath.   clonazePAM 0.5 MG tablet Commonly known as: KLONOPIN Take 1 tablet (0.5 mg total) by mouth 3 (three) times daily as needed. for anxiety   prazosin 2 MG capsule Commonly known as: MINIPRESS Take 2 capsules (4 mg total) by mouth at bedtime.   predniSONE 5 MG tablet Commonly known as: DELTASONE Label  & dispense according to the schedule below. Take 6 Pills PO for 3 days, 4 Pills PO for 3 days, 2 Pills PO for 3 days, 1 Pills PO for 3 days, 1/2 Pill  PO for 3 days then STOP. Total 45 pills.   sertraline 50 MG tablet Commonly known as: Zoloft Take 1 tablet (50 mg total) by mouth daily.   ziprasidone 60 MG capsule Commonly known as: GEODON Take 1 capsule (60 mg total) by mouth at bedtime.       Follow-up Information    Terald Sleeper, PA-C. Schedule an appointment as soon as possible for a visit in 1 week(s).   Specialties: Physician Assistant, Family Medicine Contact information: [place] [id number] [phone]           Major procedures and Radiology Reports - PLEASE review detailed and final reports thoroughly  -       Dg Chest Port 1 View  Result Date: 04/29/2019 CLINICAL DATA:  COVID-19 pneumonia EXAM: PORTABLE CHEST 1 VIEW COMPARISON:  04/28/2019 FINDINGS: The cardiomediastinal contours are somewhat prominent but some of this is due to the AP projection and portable technique. Persistent diffuse patchy interstitial and airspace infiltrates but improved lung aeration partly due to better inspiration. No pleural effusions. IMPRESSION: Persistent patchy bilateral interstitial and airspace infiltrates with slight improved aeration at least partly due to better inspiratory effort. Electronically Signed    By: Marijo Sanes M.D.   On: 04/29/2019 11:22    Micro Results     No results found for this or any previous visit (from the past 240 hour(s)).  Today   Subjective    Shemeka Barry today has  no headache,no chest abdominal pain,no new weakness tingling or numbness, feels much better wants to go home today.     Objective   Blood pressure (!) 94/49, pulse 81, temperature (!) 97.4 F (36.3 C), temperature source Oral, resp. rate 14, height 4\' 11"  (1.499 m), weight 108.4 kg, SpO2 90 %.   Intake/Output Summary (Last 24 hours) at 04/30/2019 1041 Last data filed at 04/29/2019 1800 Gross per 24 hour  Intake 360 ml  Output --  Net 360 ml    Exam  Awake Alert, Oriented x 3, No new F.N deficits, Normal affect [place].AT,PERRAL Supple Neck,No JVD, No cervical lymphadenopathy appriciated.  Symmetrical Chest wall movement, Good air movement bilaterally, CTAB RRR,No Gallops,Rubs or new Murmurs, No Parasternal Heave +ve B.Sounds, Abd Soft, Non tender, No organomegaly appriciated, No rebound -guarding or rigidity. No Cyanosis, Clubbing or edema, No new Rash or bruise   Data Review   CBC w Diff:  Lab Results  Component Value Date   WBC 13.2 (H) 04/30/2019   HGB 14.1 04/30/2019   HCT 42.3 04/30/2019   PLT 654 (H) 04/30/2019   LYMPHOPCT 17 04/30/2019   MONOPCT 6 04/30/2019   EOSPCT 0 04/30/2019   BASOPCT 0 04/30/2019    CMP:  Lab Results  Component Value Date   NA 140 04/30/2019   K 3.6 04/30/2019   CL 103 04/30/2019   CO2 22 04/30/2019   BUN 14 04/30/2019   CREATININE 0.57 04/30/2019   PROT 7.3 04/30/2019   ALBUMIN 3.5 04/30/2019   BILITOT 0.4 04/30/2019   ALKPHOS 66 04/30/2019   AST 53 (H) 04/30/2019   ALT 47 (H) 04/30/2019  .   Total Time in preparing paper work, data evaluation and todays exam - 31 minutes  Lala Lund M.D on 04/30/2019 at 10:41 AM  [facility]   Office  [phone]

## 2019-04-30 NOTE — Progress Notes (Signed)
Pt O2 sats 90-95% on room air all night.

## 2019-05-05 ENCOUNTER — Other Ambulatory Visit: Payer: Self-pay | Admitting: Physician Assistant

## 2019-05-05 DIAGNOSIS — F411 Generalized anxiety disorder: Secondary | ICD-10-CM

## 2019-05-05 DIAGNOSIS — F4310 Post-traumatic stress disorder, unspecified: Secondary | ICD-10-CM

## 2019-05-08 ENCOUNTER — Emergency Department (HOSPITAL_COMMUNITY): Payer: Medicaid Other

## 2019-05-08 ENCOUNTER — Other Ambulatory Visit: Payer: Self-pay

## 2019-05-08 ENCOUNTER — Encounter: Payer: Self-pay | Admitting: Physician Assistant

## 2019-05-08 ENCOUNTER — Ambulatory Visit (INDEPENDENT_AMBULATORY_CARE_PROVIDER_SITE_OTHER): Payer: Medicaid Other | Admitting: Physician Assistant

## 2019-05-08 ENCOUNTER — Emergency Department (HOSPITAL_COMMUNITY)
Admission: EM | Admit: 2019-05-08 | Discharge: 2019-05-09 | Disposition: A | Discharge status: Home / Self Care | Payer: Medicaid Other | Attending: Emergency Medicine | Admitting: Emergency Medicine

## 2019-05-08 DIAGNOSIS — F418 Other specified anxiety disorders: Secondary | ICD-10-CM | POA: Diagnosis not present

## 2019-05-08 DIAGNOSIS — E119 Type 2 diabetes mellitus without complications: Secondary | ICD-10-CM | POA: Diagnosis not present

## 2019-05-08 DIAGNOSIS — J45909 Unspecified asthma, uncomplicated: Secondary | ICD-10-CM | POA: Diagnosis not present

## 2019-05-08 DIAGNOSIS — J1289 Other viral pneumonia: Secondary | ICD-10-CM | POA: Diagnosis not present

## 2019-05-08 DIAGNOSIS — R7303 Prediabetes: Secondary | ICD-10-CM

## 2019-05-08 DIAGNOSIS — U071 COVID-19: Secondary | ICD-10-CM

## 2019-05-08 DIAGNOSIS — Z79899 Other long term (current) drug therapy: Secondary | ICD-10-CM | POA: Diagnosis not present

## 2019-05-08 DIAGNOSIS — Z87891 Personal history of nicotine dependence: Secondary | ICD-10-CM | POA: Insufficient documentation

## 2019-05-08 DIAGNOSIS — F411 Generalized anxiety disorder: Secondary | ICD-10-CM

## 2019-05-08 DIAGNOSIS — J988 Other specified respiratory disorders: Secondary | ICD-10-CM

## 2019-05-08 DIAGNOSIS — R0789 Other chest pain: Secondary | ICD-10-CM

## 2019-05-08 DIAGNOSIS — R079 Chest pain, unspecified: Secondary | ICD-10-CM | POA: Diagnosis present

## 2019-05-08 DIAGNOSIS — F4310 Post-traumatic stress disorder, unspecified: Secondary | ICD-10-CM | POA: Diagnosis not present

## 2019-05-08 LAB — CBC WITH DIFFERENTIAL/PLATELET
Abs Immature Granulocytes: 0.29 10*3/uL — ABNORMAL HIGH (ref 0.00–0.07)
Basophils Absolute: 0.1 10*3/uL (ref 0.0–0.1)
Basophils Relative: 0 %
Eosinophils Absolute: 0.1 10*3/uL (ref 0.0–0.5)
Eosinophils Relative: 1 %
HCT: 46.8 % — ABNORMAL HIGH (ref 36.0–46.0)
Hemoglobin: 15.6 g/dL — ABNORMAL HIGH (ref 12.0–15.0)
Immature Granulocytes: 2 %
Lymphocytes Relative: 29 %
Lymphs Abs: 4.1 10*3/uL — ABNORMAL HIGH (ref 0.7–4.0)
MCH: 31 pg (ref 26.0–34.0)
MCHC: 33.3 g/dL (ref 30.0–36.0)
MCV: 92.9 fL (ref 80.0–100.0)
Monocytes Absolute: 0.8 10*3/uL (ref 0.1–1.0)
Monocytes Relative: 6 %
Neutro Abs: 8.7 10*3/uL — ABNORMAL HIGH (ref 1.7–7.7)
Neutrophils Relative %: 62 %
Platelets: 363 10*3/uL (ref 150–400)
RBC: 5.04 MIL/uL (ref 3.87–5.11)
RDW: 13.2 % (ref 11.5–15.5)
WBC: 14.1 10*3/uL — ABNORMAL HIGH (ref 4.0–10.5)
nRBC: 0 % (ref 0.0–0.2)

## 2019-05-08 LAB — BASIC METABOLIC PANEL
Anion gap: 11 (ref 5–15)
BUN: 8 mg/dL (ref 6–20)
CO2: 23 mmol/L (ref 22–32)
Calcium: 9.6 mg/dL (ref 8.9–10.3)
Chloride: 103 mmol/L (ref 98–111)
Creatinine, Ser: 0.75 mg/dL (ref 0.44–1.00)
GFR calc Af Amer: >60 mL/min (ref >60)
GFR calc non Af Amer: >60 mL/min (ref >60)
Glucose, Bld: 111 mg/dL — ABNORMAL HIGH (ref 70–99)
Potassium: 4 mmol/L (ref 3.5–5.1)
Sodium: 137 mmol/L (ref 135–145)

## 2019-05-08 LAB — I-STAT BETA HCG BLOOD, ED (MC, WL, AP ONLY): I-stat hCG, quantitative: <5 m[IU]/mL (ref <5)

## 2019-05-08 LAB — TROPONIN I (HIGH SENSITIVITY): Troponin I (High Sensitivity): 3 ng/L (ref <18)

## 2019-05-08 LAB — D-DIMER, QUANTITATIVE: D-Dimer, Quant: <0.27 ug/mL-FEU (ref 0.00–0.50)

## 2019-05-08 MED ORDER — IBUPROFEN 800 MG PO TABS
800.0000 mg | ORAL_TABLET | Freq: Once | ORAL | Status: AC
  Administered 2019-05-09: 800 mg via ORAL
  Filled 2019-05-08: qty 1

## 2019-05-08 MED ORDER — ACETAMINOPHEN 325 MG PO TABS
650.0000 mg | ORAL_TABLET | Freq: Once | ORAL | Status: AC
  Administered 2019-05-09: 650 mg via ORAL
  Filled 2019-05-08: qty 2

## 2019-05-08 MED ORDER — CLONAZEPAM 0.5 MG PO TABS: 0.5000 mg | ORAL_TABLET | Freq: Three times a day (TID) | ORAL | 5 refills | Status: DC | PRN

## 2019-05-08 MED ORDER — ALBUTEROL SULFATE HFA 108 (90 BASE) MCG/ACT IN AERS
2.0000 | INHALATION_SPRAY | Freq: Once | RESPIRATORY_TRACT | Status: AC
  Administered 2019-05-09: 01:00:00 2 via RESPIRATORY_TRACT
  Filled 2019-05-08: qty 6.7

## 2019-05-08 MED ORDER — METFORMIN HCL 500 MG PO TABS: 500.0000 mg | ORAL_TABLET | Freq: Every day | ORAL | 5 refills | Status: DC

## 2019-05-08 NOTE — ED Provider Notes (Signed)
[facility] EMERGENCY DEPARTMENT Provider Note   CSN: [id number] Arrival date & time: 05/08/19  1955    History   Chief Complaint Chief Complaint  Patient presents with  . Chest Pain  . Cough    HPI Kelly Barry is a 28 y.o. female.     HPI   Pt is a 28 y/o female with a h/o asthma, bipolar disorder, borderline personality disorder, GERd, migraines, PTSD, who presents to the ED today for eval of chest pain and back pain. She was recently diagnosed with COVID and was admitted from 04/28/19-7/720. She has been on 5mg  of prednisone for COVID. Pain to her chest is located centrally. Pain is constant. Pain rated 8/10. Pain is worse with coughing and with inspiration. She has intermittent SOB that occurs mostly after a coughing spell or when she exerts herself. Pain is also located to the bilat upper back. This pain is also worsened with inspiration. States that her cough seems to be worsening and she is now having sputum production. She has not tried any interventions for her symptoms. No BLE swelling or leg pain. No NVD. Has not had any continued fevers. No hemoptysis.   Past Medical History:  Diagnosis Date  . Adopted   . Anxiety   . Asthma   . Bipolar disorder ([place])   . Borderline personality disorder ([place])   . Depression   . GERD (gastroesophageal reflux disease)   . Headache    Migraines  . PTSD (post-traumatic stress disorder)   . Suicide attempt [facility])     Patient Active Problem List   Diagnosis Date Noted  . Prediabetes 05/08/2019  . COVID-19 04/29/2019  . Asthma 04/29/2019  . Transaminitis 04/29/2019  . Pneumonia due to COVID-19 virus 04/29/2019  . GAD (generalized anxiety disorder) 03/10/2019  . Depression, major, single episode, moderate ([place]) 03/10/2019  . History of miscarriage 02/21/2018  . Mood disorder ([place]) 07/04/2017  . PTSD (post-traumatic stress disorder) 01/14/2017  . Suicidal ideations 03/28/2016  . MDD (major depressive  disorder), recurrent episode, severe ([place]) 03/27/2016    Past Surgical History:  Procedure Laterality Date  . DILATION AND EVACUATION N/A 01/02/2018   Procedure: ULTRASOUND GUIDED DILATATION AND EVACUATION;  Surgeon: Servando Salina, MD;  Location: [facility];  Service: Gynecology;  Laterality: N/A;  . ESOPHAGOGASTRODUODENOSCOPY  06/25/2012   Procedure: ESOPHAGOGASTRODUODENOSCOPY (EGD);  Surgeon: Arta Silence, MD;  Location: [facility];  Service: Endoscopy;  Laterality: N/A;  [name]/ebp  . GANGLION CYST EXCISION    . HYSTEROSCOPY N/A 10/20/2015   Procedure: HYSTEROSCOPY;  Surgeon: Janyth Pupa, DO;  Location: [facility];  Service: Gynecology;  Laterality: N/A;  . IUD REMOVAL N/A 10/20/2015   Procedure: INTRAUTERINE DEVICE (IUD) REMOVAL;  Surgeon: Janyth Pupa, DO;  Location: [facility];  Service: Gynecology;  Laterality: N/A;     OB History    Gravida  1   Para      Term      Preterm      AB  1   Living        SAB  1   TAB      Ectopic      Multiple      Live Births               Home Medications    Prior to Admission medications   Medication Sig Start Date End Date Taking? Authorizing Provider  albuterol (VENTOLIN HFA) 108 (90 Base) MCG/ACT inhaler Inhale 2 puffs into the  lungs every 6 (six) hours as needed for wheezing or shortness of breath. 04/30/19  Yes Thurnell Lose, MD  clonazePAM (KLONOPIN) 0.5 MG tablet Take 1 tablet (0.5 mg total) by mouth 3 (three) times daily as needed. for anxiety Patient taking differently: Take 0.5 mg by mouth 3 (three) times daily as needed for anxiety.  05/08/19  Yes Terald Sleeper, PA-C  prazosin (MINIPRESS) 2 MG capsule Take 2 capsules (4 mg total) by mouth at bedtime. 08/11/18  Yes Terald Sleeper, PA-C  predniSONE (DELTASONE) 5 MG tablet Label  & dispense according to the schedule below. Take 6 Pills PO for 3 days, 4 Pills PO for 3 days, 2 Pills PO for 3 days, 1 Pills PO for 3 days, 1/2 Pill  PO for 3 days then STOP. Total 45  pills. Patient taking differently: Take 2.5-30 mg by mouth See admin instructions. Take 30 mg by mouth once a day for 3 days, 20 mg once a day for 3 days, 10 mg once a day for 3 days, 5 mg once a day for 3 days, 2.5 mg once a day for 3 days, then stop 04/30/19  Yes Thurnell Lose, MD  ziprasidone (GEODON) 60 MG capsule Take 1 capsule (60 mg total) by mouth at bedtime. 08/11/18  Yes Terald Sleeper, PA-C  acetaminophen (TYLENOL) 325 MG tablet Take 2 tablets (650 mg total) by mouth every 6 (six) hours as needed. Do not take more than 4000mg  of tylenol per day 05/09/19   Niamh Rada S, PA-C  ibuprofen (ADVIL) 400 MG tablet Take 1 tablet (400 mg total) by mouth every 6 (six) hours as needed. 05/09/19   Makhari Dovidio S, PA-C  metFORMIN (GLUCOPHAGE) 500 MG tablet Take 1 tablet (500 mg total) by mouth daily before supper. 05/08/19   Terald Sleeper, PA-C    Family History Family History  Adopted: Yes    Social History Social History   Tobacco Use  . Smoking status: Former Smoker    Packs/day: 0.10    Years: 0.20    Pack years: 0.02    Types: Cigarettes  . Smokeless tobacco: Never Used  Substance Use Topics  . Alcohol use: No  . Drug use: No     Allergies   Diclofenac, Hydroxyzine, Lactose intolerance (gi), and Icy hot   Review of Systems Review of Systems  Constitutional: Negative for fever.  HENT: Positive for congestion, rhinorrhea and sore throat. Negative for ear pain.   Eyes: Negative for visual disturbance.  Respiratory: Positive for cough and shortness of breath.   Cardiovascular: Positive for chest pain. Negative for leg swelling.  Gastrointestinal: Negative for abdominal pain, constipation, diarrhea, nausea and vomiting.  Genitourinary: Negative for dysuria and hematuria.  Musculoskeletal: Positive for back pain.  Skin: Negative for rash.  Neurological: Negative for headaches.  All other systems reviewed and are negative.    Physical Exam Updated Vital Signs BP  (!) 123/94 (BP Location: Right Arm)   Pulse (!) 107   Temp 99.1 F (37.3 C) (Oral)   Resp (!) 22   LMP  (LMP Unknown)   SpO2 97%   Physical Exam Vitals signs and nursing note reviewed.  Constitutional:      General: She is not in acute distress.    Appearance: She is well-developed. She is not ill-appearing or toxic-appearing.  HENT:     Head: Normocephalic and atraumatic.  Eyes:     Conjunctiva/sclera: Conjunctivae normal.  Neck:  Musculoskeletal: Neck supple.  Cardiovascular:     Rate and Rhythm: Normal rate and regular rhythm.     Heart sounds: Normal heart sounds. No murmur.  Pulmonary:     Effort: Pulmonary effort is normal. No respiratory distress.     Breath sounds: Normal breath sounds. No decreased breath sounds, wheezing, rhonchi or rales.  Abdominal:     General: Bowel sounds are normal.     Palpations: Abdomen is soft.     Tenderness: There is no abdominal tenderness. There is no guarding or rebound.  Musculoskeletal:     Right lower leg: She exhibits no tenderness. No edema.     Left lower leg: She exhibits no tenderness. No edema.  Skin:    General: Skin is warm and dry.  Neurological:     Mental Status: She is alert.    ED Treatments / Results  Labs (all labs ordered are listed, but only abnormal results are displayed) Labs Reviewed  CBC WITH DIFFERENTIAL/PLATELET - Abnormal; Notable for the following components:      Result Value   WBC 14.1 (*)    Hemoglobin 15.6 (*)    HCT 46.8 (*)    Neutro Abs 8.7 (*)    Lymphs Abs 4.1 (*)    Abs Immature Granulocytes 0.29 (*)    All other components within normal limits  BASIC METABOLIC PANEL - Abnormal; Notable for the following components:   Glucose, Bld 111 (*)    All other components within normal limits  D-DIMER, QUANTITATIVE (NOT AT A [facility])  I-STAT BETA HCG BLOOD, ED ([facility], [facility], [facility] ONLY)  TROPONIN I (HIGH SENSITIVITY)  TROPONIN I (HIGH SENSITIVITY)    EKG EKG Interpretation  Date/Time:  Friday  May 08 2019 20:24:47 EDT Ventricular Rate:  112 PR Interval:    QRS Duration: 81 QT Interval:  322 QTC Calculation: 440 R Axis:   88 Text Interpretation:  Sinus tachycardia Low voltage, precordial leads Since last tracing rate faster Confirmed by Isla Pence [phone]) on 05/08/2019 8:29:59 PM   Radiology Dg Chest Portable 1 View  Result Date: 05/08/2019 CLINICAL DATA:  COVID-19 positive.  Chest pain, cough EXAM: PORTABLE CHEST 1 VIEW COMPARISON:  April 29, 2019 FINDINGS: Heart is borderline in size. Mild interstitial prominence throughout the lungs, but improving interstitial and alveolar airspace opacity since prior study. No effusions or acute bony abnormality. IMPRESSION: Improving interstitial and airspace opacities since prior study. Electronically Signed   By: Rolm Baptise M.D.   On: 05/08/2019 22:50    Procedures Procedures (including critical care time)  Medications Ordered in ED Medications  ibuprofen (ADVIL) tablet 800 mg (has no administration in time range)  acetaminophen (TYLENOL) tablet 650 mg (has no administration in time range)  albuterol (VENTOLIN HFA) 108 (90 Base) MCG/ACT inhaler 2 puff (has no administration in time range)     Initial Impression / Assessment and Plan / ED Course  I have reviewed the triage vital signs and the nursing notes.  Pertinent labs & imaging results that were available during my care of the patient were reviewed by me and considered in my medical decision making (see chart for details).   Final Clinical Impressions(s) / ED Diagnoses   Final diagnoses:  COVID-19  Atypical chest pain   28 year old female presenting for evaluation of chest pain and back pain.  Pain is worse with coughing and is pleuritic in nature.  She was recently diagnosed with COVID and admits to to the hospital for a few days for  hypoxia.  Recently discharged and had been doing well at home.  Patient arrives and is borderline febrile.  She is marginally  tachycardic.  Satting well on room air with minimally increased respiratory rate.  Normal blood pressure.  She is well-appearing on exam.  Lungs are clear to auscultation bilaterally.  Heart with regular rate and rhythm.  No lower extremity edema or calf tenderness.  CBC is with mild leukocytosis, likely secondary to steroid use.  No anemia. BMP with no significant electrolyte derangement.  Normal renal function. Troponin is negative.  Chest pain ongoing for about a week therefore delta troponin is not indicated. D-dimer is negative making PE less likely.  EKG with sinus tachycardia, no ischemic changes.  Chest x-ray obtained with improving interstitial and airspace opacities since prior study.  On reevaluation, patient remains nontoxic nonseptic appearing.  I will give her an albuterol inhaler for home use.  Have advised Tylenol, Motrin for body aches.  Have advised her to follow-up with her PCP in 1 week for reevaluation and to return to the ER for new or worsening symptoms.  She voices understanding of the plan and reasons to return.  All questions answered.  Patient stable for discharge.  ED Discharge Orders         Ordered    acetaminophen (TYLENOL) 325 MG tablet  Every 6 hours PRN     05/09/19 0007    ibuprofen (ADVIL) 400 MG tablet  Every 6 hours PRN     05/09/19 0007           Annalynne Ibanez S, PA-C 05/09/19 0008    Veryl Speak, MD 05/10/19 2116

## 2019-05-08 NOTE — Progress Notes (Signed)
Telephone visit  Subjective: CC: Hospital follow-up PCP: Maurice Small, MD Kelly Barry is a 28 y.o. female calls for telephone consult today. Patient provides verbal consent for consult held via phone.  Patient is identified with 2 separate identifiers.  At this time the entire area is on COVID-19 social distancing and stay home orders are in place.  Patient is of higher risk and therefore we are performing this by a virtual method.  Location of patient: home Location of provider: [facility] Others present for call: no   This patient is for hospital follow-up.  She was admitted on 04/28/2019 and discharged on 04/30/2019.  Her admission was related to a COVID infection.  She was admitted to the [facility].  Her emergency department and hospital record H&P and discharge summary are reviewed.  She states she has continued to feel extremely weak and tired.  She also was diagnosed with pneumonia.  She was sent home on antibiotic and with an inhaler.  She states she is still sleeping a lot.  She feels some better than when she originally got sick but still just very tired compared to normal.    She reports that she had some labs performed during a well physical few months ago and was told that her labs showed she was prediabetic.  I went and reviewed the labs and she did have a hemoglobin A1c of 6.2.  The patient reports that she has had a significant amount of weight gain over the past year.  She states that she was weighing about 210 pounds and now is in the high 190s.  She has been trying to lose weight and not able to be very successful with it.  We had a long discussion about metformin being able to lower her appetite and to help with insulin resistance.  I need to have her start metformin 500 mg 1 daily with her largest meal.  And we will plan for her to come in in 3 months and have labs performed.  ROS: Per HPI  Allergies  Allergen Reactions  . Diclofenac Anaphylaxis and  Rash  . Hydroxyzine Other (See Comments)    Headaches....fevers  . Icy Hot Rash   Past Medical History:  Diagnosis Date  . Adopted   . Anxiety   . Asthma   . Bipolar disorder ([place])   . Borderline personality disorder ([place])   . Depression   . GERD (gastroesophageal reflux disease)   . Headache    Migraines  . PTSD (post-traumatic stress disorder)   . Suicide attempt [facility])     Current Outpatient Medications:  .  albuterol (VENTOLIN HFA) 108 (90 Base) MCG/ACT inhaler, Inhale 2 puffs into the lungs every 6 (six) hours as needed for wheezing or shortness of breath., Disp: 6.7 g, Rfl: 0 .  clonazePAM (KLONOPIN) 0.5 MG tablet, Take 1 tablet (0.5 mg total) by mouth 3 (three) times daily as needed. for anxiety, Disp: 90 tablet, Rfl: 5 .  prazosin (MINIPRESS) 2 MG capsule, Take 2 capsules (4 mg total) by mouth at bedtime., Disp: 180 capsule, Rfl: 3 .  predniSONE (DELTASONE) 5 MG tablet, Label  & dispense according to the schedule below. Take 6 Pills PO for 3 days, 4 Pills PO for 3 days, 2 Pills PO for 3 days, 1 Pills PO for 3 days, 1/2 Pill  PO for 3 days then STOP. Total 45 pills., Disp: 45 tablet, Rfl: 0 .  ziprasidone (GEODON) 60 MG  capsule, Take 1 capsule (60 mg total) by mouth at bedtime., Disp: 90 capsule, Rfl: 3 .  metFORMIN (GLUCOPHAGE) 500 MG tablet, Take 1 tablet (500 mg total) by mouth daily before supper., Disp: 30 tablet, Rfl: 5  Assessment/ Plan: 28 y.o. female   1. COVID-19 Complete medication sent home by the hospital  2. Prediabetes Start metformin 500 mg 1 at supper Recheck hemoglobin A1c in 3 months at a visit to our office  3. PTSD (post-traumatic stress disorder) - clonazePAM (KLONOPIN) 0.5 MG tablet; Take 1 tablet (0.5 mg total) by mouth 3 (three) times daily as needed. for anxiety  Dispense: 90 tablet; Refill: 5  4. GAD (generalized anxiety disorder) - clonazePAM (KLONOPIN) 0.5 MG tablet; Take 1 tablet (0.5 mg total) by mouth 3 (three) times daily as needed. for  anxiety  Dispense: 90 tablet; Refill: 5   No follow-ups on file.  Continue all other maintenance medications as listed above.  Start time: 8:01 AM End time: 8:14 AM  Meds ordered this encounter  Medications  . clonazePAM (KLONOPIN) 0.5 MG tablet    Sig: Take 1 tablet (0.5 mg total) by mouth 3 (three) times daily as needed. for anxiety    Dispense:  90 tablet    Refill:  5    Order Specific Question:   Supervising Provider    Answer:   Janora Norlander [[id number]]  . metFORMIN (GLUCOPHAGE) 500 MG tablet    Sig: Take 1 tablet (500 mg total) by mouth daily before supper.    Dispense:  30 tablet    Refill:  5    Order Specific Question:   Supervising Provider    Answer:   Janora Norlander [[id number]]    Particia Nearing PA-C [place] [phone]

## 2019-05-08 NOTE — ED Triage Notes (Signed)
Pt report recent admission for COVID, discharged 3 days ago. Pt reports she was doing well then 2 days ago started having chest pain and back pain, worsening with coughing. Pt denies fever for the last 3 days. Pt reports only taking prednisone for cough and pain.

## 2019-05-09 MED ORDER — ACETAMINOPHEN 325 MG PO TABS: 650.0000 mg | ORAL_TABLET | Freq: Four times a day (QID) | ORAL | 0 refills | Status: DC | PRN

## 2019-05-09 MED ORDER — IBUPROFEN 400 MG PO TABS: 400.0000 mg | ORAL_TABLET | Freq: Four times a day (QID) | ORAL | 0 refills | Status: DC | PRN

## 2019-05-09 NOTE — Discharge Instructions (Addendum)

## 2019-08-05 ENCOUNTER — Encounter: Payer: Self-pay | Admitting: Physician Assistant

## 2019-08-05 ENCOUNTER — Ambulatory Visit (INDEPENDENT_AMBULATORY_CARE_PROVIDER_SITE_OTHER): Payer: Medicaid Other | Admitting: Physician Assistant

## 2019-08-05 DIAGNOSIS — E119 Type 2 diabetes mellitus without complications: Secondary | ICD-10-CM | POA: Diagnosis not present

## 2019-08-05 DIAGNOSIS — F332 Major depressive disorder, recurrent severe without psychotic features: Secondary | ICD-10-CM | POA: Diagnosis not present

## 2019-08-05 DIAGNOSIS — R7303 Prediabetes: Secondary | ICD-10-CM | POA: Diagnosis not present

## 2019-08-05 DIAGNOSIS — F411 Generalized anxiety disorder: Secondary | ICD-10-CM

## 2019-08-05 DIAGNOSIS — F4310 Post-traumatic stress disorder, unspecified: Secondary | ICD-10-CM | POA: Diagnosis not present

## 2019-08-05 MED ORDER — CLONAZEPAM 0.5 MG PO TABS: 0.5000 mg | ORAL_TABLET | Freq: Three times a day (TID) | ORAL | 2 refills | Status: DC | PRN

## 2019-08-05 MED ORDER — ZIPRASIDONE HCL 60 MG PO CAPS: 60.0000 mg | ORAL_CAPSULE | Freq: Every day | ORAL | 3 refills | Status: DC

## 2019-08-05 MED ORDER — TOPIRAMATE 50 MG PO TABS: 50.0000 mg | ORAL_TABLET | Freq: Two times a day (BID) | ORAL | 5 refills | Status: DC

## 2019-08-05 MED ORDER — PRAZOSIN HCL 2 MG PO CAPS: 4.0000 mg | ORAL_CAPSULE | Freq: Every day | ORAL | 3 refills | Status: DC

## 2019-08-05 MED ORDER — METFORMIN HCL 500 MG PO TABS: 500.0000 mg | ORAL_TABLET | Freq: Every day | ORAL | 5 refills | Status: DC

## 2019-08-05 NOTE — Progress Notes (Signed)
931  Flu shot at Dr Beverly Gust

## 2019-08-06 ENCOUNTER — Encounter: Payer: Self-pay | Admitting: Physician Assistant

## 2019-08-06 NOTE — Progress Notes (Signed)
Telephone visit  Subjective: CC: [facility] chronic medical conditions PCP: Terald Sleeper, PA-C ZOX:WRUE E Lukasik is a 28 y.o. female calls for telephone consult today. Patient provides verbal consent for consult held via phone.  Patient is identified with 2 separate identifiers.  At this time the entire area is on COVID-19 social distancing and stay home orders are in place.  Patient is of higher risk and therefore we are performing this by a virtual method.  Location of patient: Home Location of provider: HOME Others present for call: no  Is having a follow-up on her chronic medical conditions which include major depressive disorder, posttraumatic stress disorder, generalized anxiety disorder, diabetes.  She did have a Covid infection was in the hospital for a short while.  She is feeling better with that not have any residual difficulties with cough or wheezing.  She states that overall she has been doing well.  She does need refills on her medications.  These will be sent in.  Have asked her to come in for labs at her earliest convenience.  Has gotten a vaccine for influenza   ROS: Per HPI  Allergies  Allergen Reactions  . Diclofenac Anaphylaxis, Swelling and Rash  . Hydroxyzine Other (See Comments)    Headaches....fevers  . Lactose Intolerance (Gi) Diarrhea and Nausea And Vomiting  . Icy Hot Rash   Past Medical History:  Diagnosis Date  . Adopted   . Anxiety   . Asthma   . Bipolar disorder ([place])   . Borderline personality disorder ([place])   . Depression   . GERD (gastroesophageal reflux disease)   . Headache    Migraines  . PTSD (post-traumatic stress disorder)   . Suicide attempt [facility])     Current Outpatient Medications:  .  acetaminophen (TYLENOL) 325 MG tablet, Take 2 tablets (650 mg total) by mouth every 6 (six) hours as needed. Do not take more than 4068m of tylenol per day, Disp: 30 tablet, Rfl: 0 .  albuterol (VENTOLIN HFA) 108 (90 Base) MCG/ACT inhaler,  Inhale 2 puffs into the lungs every 6 (six) hours as needed for wheezing or shortness of breath., Disp: 6.7 g, Rfl: 0 .  clonazePAM (KLONOPIN) 0.5 MG tablet, Take 1 tablet (0.5 mg total) by mouth 3 (three) times daily as needed. for anxiety, Disp: 90 tablet, Rfl: 2 .  ibuprofen (ADVIL) 400 MG tablet, Take 1 tablet (400 mg total) by mouth every 6 (six) hours as needed., Disp: 30 tablet, Rfl: 0 .  metFORMIN (GLUCOPHAGE) 500 MG tablet, Take 1 tablet (500 mg total) by mouth daily before supper., Disp: 30 tablet, Rfl: 5 .  prazosin (MINIPRESS) 2 MG capsule, Take 2 capsules (4 mg total) by mouth at bedtime., Disp: 180 capsule, Rfl: 3 .  topiramate (TOPAMAX) 50 MG tablet, Take 1 tablet (50 mg total) by mouth 2 (two) times daily., Disp: 60 tablet, Rfl: 5 .  ziprasidone (GEODON) 60 MG capsule, Take 1 capsule (60 mg total) by mouth at bedtime., Disp: 90 capsule, Rfl: 3  Assessment/ Plan: 28y.o. female   1. Severe episode of recurrent major depressive disorder, without psychotic features ([place] - ziprasidone (GEODON) 60 MG capsule; Take 1 capsule (60 mg total) by mouth at bedtime.  Dispense: 90 capsule; Refill: 3 - prazosin (MINIPRESS) 2 MG capsule; Take 2 capsules (4 mg total) by mouth at bedtime.  Dispense: 180 capsule; Refill: 3 - CBC with Differential/Platelet - CMP14+EGFR - Lipid panel - Bayer DCA Hb A1c Waived  2. Prediabetes - metFORMIN (GLUCOPHAGE) 500 MG tablet; Take 1 tablet (500 mg total) by mouth daily before supper.  Dispense: 30 tablet; Refill: 5  3. PTSD (post-traumatic stress disorder) - clonazePAM (KLONOPIN) 0.5 MG tablet; Take 1 tablet (0.5 mg total) by mouth 3 (three) times daily as needed. for anxiety  Dispense: 90 tablet; Refill: 2  4. GAD (generalized anxiety disorder) - clonazePAM (KLONOPIN) 0.5 MG tablet; Take 1 tablet (0.5 mg total) by mouth 3 (three) times daily as needed. for anxiety  Dispense: 90 tablet; Refill: 2  5. Controlled type 2 diabetes mellitus without  complication, without long-term current use of insulin (HCC) - CBC with Differential/Platelet - CMP14+EGFR - Lipid panel - Bayer DCA Hb A1c Waived   Return in about 3 months (around 11/05/2019).  Continue all other maintenance medications as listed above.  Start time: 9:36 AM End time: 9:47 AM  Meds ordered this encounter  Medications  . ziprasidone (GEODON) 60 MG capsule    Sig: Take 1 capsule (60 mg total) by mouth at bedtime.    Dispense:  90 capsule    Refill:  3    Order Specific Question:   Supervising Provider    Answer:   Janora Norlander [[id number]]  . prazosin (MINIPRESS) 2 MG capsule    Sig: Take 2 capsules (4 mg total) by mouth at bedtime.    Dispense:  180 capsule    Refill:  3    Order Specific Question:   Supervising Provider    Answer:   Janora Norlander [[id number]]  . metFORMIN (GLUCOPHAGE) 500 MG tablet    Sig: Take 1 tablet (500 mg total) by mouth daily before supper.    Dispense:  30 tablet    Refill:  5    Order Specific Question:   Supervising Provider    Answer:   Janora Norlander [[id number]]  . clonazePAM (KLONOPIN) 0.5 MG tablet    Sig: Take 1 tablet (0.5 mg total) by mouth 3 (three) times daily as needed. for anxiety    Dispense:  90 tablet    Refill:  2    Order Specific Question:   Supervising Provider    Answer:   Janora Norlander [[id number]]  . topiramate (TOPAMAX) 50 MG tablet    Sig: Take 1 tablet (50 mg total) by mouth 2 (two) times daily.    Dispense:  60 tablet    Refill:  5    Order Specific Question:   Supervising Provider    Answer:   Janora Norlander [[id number]]    Particia Nearing PA-C [place] [phone]

## 2019-09-08 ENCOUNTER — Other Ambulatory Visit: Payer: Self-pay

## 2019-09-08 ENCOUNTER — Emergency Department (HOSPITAL_COMMUNITY)
Admission: EM | Admit: 2019-09-08 | Discharge: 2019-09-09 | Disposition: A | Discharge status: Home / Self Care | Payer: Medicaid Other | Attending: Emergency Medicine | Admitting: Emergency Medicine

## 2019-09-08 DIAGNOSIS — Z7984 Long term (current) use of oral hypoglycemic drugs: Secondary | ICD-10-CM | POA: Diagnosis not present

## 2019-09-08 DIAGNOSIS — Z87891 Personal history of nicotine dependence: Secondary | ICD-10-CM | POA: Insufficient documentation

## 2019-09-08 DIAGNOSIS — N939 Abnormal uterine and vaginal bleeding, unspecified: Secondary | ICD-10-CM | POA: Diagnosis present

## 2019-09-08 DIAGNOSIS — N938 Other specified abnormal uterine and vaginal bleeding: Secondary | ICD-10-CM | POA: Insufficient documentation

## 2019-09-08 DIAGNOSIS — Z79899 Other long term (current) drug therapy: Secondary | ICD-10-CM | POA: Insufficient documentation

## 2019-09-08 DIAGNOSIS — J45909 Unspecified asthma, uncomplicated: Secondary | ICD-10-CM | POA: Diagnosis not present

## 2019-09-08 LAB — I-STAT BETA HCG BLOOD, ED (MC, WL, AP ONLY): I-stat hCG, quantitative: <5 m[IU]/mL (ref <5)

## 2019-09-08 LAB — CBC
HCT: 43.9 % (ref 36.0–46.0)
Hemoglobin: 15 g/dL (ref 12.0–15.0)
MCH: 31.4 pg (ref 26.0–34.0)
MCHC: 34.2 g/dL (ref 30.0–36.0)
MCV: 91.8 fL (ref 80.0–100.0)
Platelets: 341 10*3/uL (ref 150–400)
RBC: 4.78 MIL/uL (ref 3.87–5.11)
RDW: 13.2 % (ref 11.5–15.5)
WBC: 10 10*3/uL (ref 4.0–10.5)
nRBC: 0 % (ref 0.0–0.2)

## 2019-09-08 LAB — COMPREHENSIVE METABOLIC PANEL
ALT: 40 U/L (ref 0–44)
AST: 39 U/L (ref 15–41)
Albumin: 4.2 g/dL (ref 3.5–5.0)
Alkaline Phosphatase: 60 U/L (ref 38–126)
Anion gap: 10 (ref 5–15)
BUN: 5 mg/dL — ABNORMAL LOW (ref 6–20)
CO2: 21 mmol/L — ABNORMAL LOW (ref 22–32)
Calcium: 10 mg/dL (ref 8.9–10.3)
Chloride: 108 mmol/L (ref 98–111)
Creatinine, Ser: 0.58 mg/dL (ref 0.44–1.00)
GFR calc Af Amer: >60 mL/min (ref >60)
GFR calc non Af Amer: >60 mL/min (ref >60)
Glucose, Bld: 108 mg/dL — ABNORMAL HIGH (ref 70–99)
Potassium: 3.8 mmol/L (ref 3.5–5.1)
Sodium: 139 mmol/L (ref 135–145)
Total Bilirubin: 0.7 mg/dL (ref 0.3–1.2)
Total Protein: 7.6 g/dL (ref 6.5–8.1)

## 2019-09-08 LAB — TYPE AND SCREEN
ABO/RH(D): B POS
Antibody Screen: NEGATIVE

## 2019-09-08 LAB — ABO/RH: ABO/RH(D): B POS

## 2019-09-08 NOTE — ED Triage Notes (Signed)
Pt here for evaluation of vaginal bleeding, abdominal pain since yesterday. Pt sts she feels weak and lightheaded. Pt sts she is wearing briefs for the bleeding. Has nexplanon for  Birth control.

## 2019-09-09 ENCOUNTER — Emergency Department (HOSPITAL_COMMUNITY): Payer: Medicaid Other

## 2019-09-09 MED ORDER — HYDROCODONE-ACETAMINOPHEN 5-325 MG PO TABS: 1.0000 | ORAL_TABLET | Freq: Four times a day (QID) | ORAL | 0 refills | Status: DC | PRN

## 2019-09-09 MED ORDER — ONDANSETRON 4 MG PO TBDP
4.0000 mg | ORAL_TABLET | Freq: Once | ORAL | Status: AC
  Administered 2019-09-09: 05:00:00 4 mg via ORAL
  Filled 2019-09-09: qty 1

## 2019-09-09 MED ORDER — HYDROCODONE-ACETAMINOPHEN 5-325 MG PO TABS
1.0000 | ORAL_TABLET | Freq: Once | ORAL | Status: AC
  Administered 2019-09-09: 05:00:00 1 via ORAL
  Filled 2019-09-09: qty 1

## 2019-09-09 NOTE — ED Provider Notes (Signed)
[facility] EMERGENCY DEPARTMENT Provider Note   CSN: [id number] Arrival date & time: 09/08/19  1828     History   Chief Complaint No chief complaint on file.   HPI Kelly Barry is a 28 y.o. female.     Patient with history of GERD, asthma, PTSD, bipolar, borderline personality presents with abnormal vaginal bleeding, lower abdominal and lower back cramping type pain x 2 days. She has a Nexplanon implant that was inserted 1 1/2 years ago and had a 5-year implant prior to that, so that she has not had a period in over 6 years. No vaginal discharge, fever. She is experiencing nausea and limited vomiting. No diarrhea. She reports urinary frequency without dysuria. She has tried taking ibuprofen without relief.   The history is provided by the patient. No language interpreter was used.    Past Medical History:  Diagnosis Date  . Adopted   . Anxiety   . Asthma   . Bipolar disorder ([place])   . Borderline personality disorder ([place])   . Depression   . GERD (gastroesophageal reflux disease)   . Headache    Migraines  . PTSD (post-traumatic stress disorder)   . Suicide attempt [facility])     Patient Active Problem List   Diagnosis Date Noted  . Prediabetes 05/08/2019  . COVID-19 04/29/2019  . Asthma 04/29/2019  . Transaminitis 04/29/2019  . Pneumonia due to COVID-19 virus 04/29/2019  . GAD (generalized anxiety disorder) 03/10/2019  . Depression, major, single episode, moderate ([place]) 03/10/2019  . History of miscarriage 02/21/2018  . Mood disorder ([place]) 07/04/2017  . PTSD (post-traumatic stress disorder) 01/14/2017  . Suicidal ideations 03/28/2016  . MDD (major depressive disorder), recurrent episode, severe ([place]) 03/27/2016    Past Surgical History:  Procedure Laterality Date  . DILATION AND EVACUATION N/A 01/02/2018   Procedure: ULTRASOUND GUIDED DILATATION AND EVACUATION;  Surgeon: Servando Salina, MD;  Location: [facility];  Service: Gynecology;   Laterality: N/A;  . ESOPHAGOGASTRODUODENOSCOPY  06/25/2012   Procedure: ESOPHAGOGASTRODUODENOSCOPY (EGD);  Surgeon: Arta Silence, MD;  Location: [facility];  Service: Endoscopy;  Laterality: N/A;  [name]/ebp  . GANGLION CYST EXCISION    . HYSTEROSCOPY N/A 10/20/2015   Procedure: HYSTEROSCOPY;  Surgeon: Janyth Pupa, DO;  Location: [facility];  Service: Gynecology;  Laterality: N/A;  . IUD REMOVAL N/A 10/20/2015   Procedure: INTRAUTERINE DEVICE (IUD) REMOVAL;  Surgeon: Janyth Pupa, DO;  Location: [facility];  Service: Gynecology;  Laterality: N/A;     OB History    Gravida  1   Para      Term      Preterm      AB  1   Living        SAB  1   TAB      Ectopic      Multiple      Live Births               Home Medications    Prior to Admission medications   Medication Sig Start Date End Date Taking? Authorizing Provider  acetaminophen (TYLENOL) 325 MG tablet Take 2 tablets (650 mg total) by mouth every 6 (six) hours as needed. Do not take more than 4000mg  of tylenol per day 05/09/19   Couture, Cortni S, PA-C  albuterol (VENTOLIN HFA) 108 (90 Base) MCG/ACT inhaler Inhale 2 puffs into the lungs every 6 (six) hours as needed for wheezing or shortness of breath. 04/30/19   Thurnell Lose, MD  clonazePAM (KLONOPIN) 0.5 MG tablet Take 1 tablet (0.5 mg total) by mouth 3 (three) times daily as needed. for anxiety 08/05/19   Terald Sleeper, PA-C  ibuprofen (ADVIL) 400 MG tablet Take 1 tablet (400 mg total) by mouth every 6 (six) hours as needed. 05/09/19   Couture, Cortni S, PA-C  metFORMIN (GLUCOPHAGE) 500 MG tablet Take 1 tablet (500 mg total) by mouth daily before supper. 08/05/19   Terald Sleeper, PA-C  prazosin (MINIPRESS) 2 MG capsule Take 2 capsules (4 mg total) by mouth at bedtime. 08/05/19   Terald Sleeper, PA-C  topiramate (TOPAMAX) 50 MG tablet Take 1 tablet (50 mg total) by mouth 2 (two) times daily. 08/05/19   Terald Sleeper, PA-C  ziprasidone (GEODON) 60 MG capsule  Take 1 capsule (60 mg total) by mouth at bedtime. 08/05/19   Terald Sleeper, PA-C    Family History Family History  Adopted: Yes    Social History Social History   Tobacco Use  . Smoking status: Former Smoker    Packs/day: 0.10    Years: 0.20    Pack years: 0.02    Types: Cigarettes  . Smokeless tobacco: Never Used  Substance Use Topics  . Alcohol use: No  . Drug use: No     Allergies   Diclofenac, Hydroxyzine, Lactose intolerance (gi), and Icy hot   Review of Systems Review of Systems  Constitutional: Negative for chills and fever.  Gastrointestinal: Positive for abdominal pain, nausea and vomiting. Negative for diarrhea.  Genitourinary: Positive for frequency and vaginal bleeding. Negative for dysuria and vaginal discharge.  Musculoskeletal: Positive for back pain.  Skin: Negative.   Neurological: Negative.      Physical Exam Updated Vital Signs BP 115/84   Pulse 80   Temp 98.6 F (37 C) (Oral)   Resp 18   SpO2 96%   Physical Exam Constitutional:      Appearance: She is well-developed.  Neck:     Musculoskeletal: Normal range of motion.  Pulmonary:     Effort: Pulmonary effort is normal.  Abdominal:     General: There is distension.     Palpations: Abdomen is soft.     Tenderness: There is abdominal tenderness (Across lower abdomen).  Genitourinary:    Comments: There is cervical bleeding without discharge or clotting. Tender pelvic bimanual exam.  Musculoskeletal: Normal range of motion.  Skin:    General: Skin is warm and dry.  Neurological:     Mental Status: She is alert and oriented to person, place, and time.      ED Treatments / Results  Labs (all labs ordered are listed, but only abnormal results are displayed) Labs Reviewed  COMPREHENSIVE METABOLIC PANEL - Abnormal; Notable for the following components:      Result Value   CO2 21 (*)    Glucose, Bld 108 (*)    BUN 5 (*)    All other components within normal limits  CBC   I-STAT BETA HCG BLOOD, ED ([facility], [facility], [facility] ONLY)  TYPE AND SCREEN  ABO/RH   Results for orders placed or performed during the hospital encounter of 09/08/19  Comprehensive metabolic panel  Result Value Ref Range   Sodium 139 135 - 145 mmol/L   Potassium 3.8 3.5 - 5.1 mmol/L   Chloride 108 98 - 111 mmol/L   CO2 21 (L) 22 - 32 mmol/L   Glucose, Bld 108 (H) 70 - 99 mg/dL   BUN 5 (L) 6 -  20 mg/dL   Creatinine, Ser 0.58 0.44 - 1.00 mg/dL   Calcium 10.0 8.9 - 10.3 mg/dL   Total Protein 7.6 6.5 - 8.1 g/dL   Albumin 4.2 3.5 - 5.0 g/dL   AST 39 15 - 41 U/L   ALT 40 0 - 44 U/L   Alkaline Phosphatase 60 38 - 126 U/L   Total Bilirubin 0.7 0.3 - 1.2 mg/dL   GFR calc non Af Amer >60 >60 mL/min   GFR calc Af Amer >60 >60 mL/min   Anion gap 10 5 - 15  CBC  Result Value Ref Range   WBC 10.0 4.0 - 10.5 K/uL   RBC 4.78 3.87 - 5.11 MIL/uL   Hemoglobin 15.0 12.0 - 15.0 g/dL   HCT 43.9 36.0 - 46.0 %   MCV 91.8 80.0 - 100.0 fL   MCH 31.4 26.0 - 34.0 pg   MCHC 34.2 30.0 - 36.0 g/dL   RDW 13.2 11.5 - 15.5 %   Platelets 341 150 - 400 K/uL   nRBC 0.0 0.0 - 0.2 %  I-Stat beta hCG blood, ED  Result Value Ref Range   I-stat hCG, quantitative <5.0 <5 mIU/mL   Comment 3          Type and screen Nucla  Result Value Ref Range   ABO/RH(D) B POS    Antibody Screen NEG    Sample Expiration      09/11/2019,2359 Performed at [facility], [address], [place], [place] [address]   ABO/Rh  Result Value Ref Range   ABO/RH(D)      B POS Performed at [facility] [address], [place], [place] [address]     EKG None  Radiology No results found.  Procedures Procedures (including critical care time)  Medications Ordered in ED Medications - No data to display   Initial Impression / Assessment and Plan / ED Course  I have reviewed the triage vital signs and the nursing notes.  Pertinent labs & imaging results that were available during my care of the  patient were reviewed by me and considered in my medical decision making (see chart for details).        Patient here with abnormal vaginal bleeding, abdominal and back cramping pain. No fever.  The patient is nontoxic in appearance. No pallor, tachycardia or hypotension. Afebrile. Normal hemoglobin. No leukocytosis. She is not pregnant.   Pelvic exam was limited by patient discomfort but there was no evidence of infection, blisters, lesions, injury or fibroids (although palpation was limited in scope).   Doubt acute abdominal process. Suspect dysfunctional uterine bleeding without infection. Recommend follow up with GYN for further evaluation. Will provide pain control.   Final Clinical Impressions(s) / ED Diagnoses   Final diagnoses:  None   1. Dysfunctional uterine bleeding  ED Discharge Orders    None       Charlann Lange, PA-C 09/09/19 0421    Fatima Blank, MD 09/09/19 [phone]

## 2019-09-09 NOTE — Discharge Instructions (Addendum)
Please make an appointment with gynecology for further management of irregular bleeding.   Take Norco for severe pain. You can continue ibuprofen (600 mg every 6 hours) for additional relief.   If you develop a high fever, uncontrolled severe pain, uncontrolled vomiting, please return to the ED for further care.

## 2019-09-09 NOTE — ED Notes (Signed)
Patient transported to X-ray 

## 2019-09-24 ENCOUNTER — Other Ambulatory Visit: Payer: Self-pay | Admitting: Physician Assistant

## 2019-09-24 ENCOUNTER — Telehealth: Payer: Self-pay | Admitting: Family Medicine

## 2019-09-24 DIAGNOSIS — F411 Generalized anxiety disorder: Secondary | ICD-10-CM

## 2019-09-24 DIAGNOSIS — F4310 Post-traumatic stress disorder, unspecified: Secondary | ICD-10-CM

## 2019-09-24 MED ORDER — CLONAZEPAM 0.5 MG PO TABS: 0.5000 mg | ORAL_TABLET | Freq: Three times a day (TID) | ORAL | 2 refills | Status: DC | PRN

## 2019-09-24 NOTE — Telephone Encounter (Signed)
Pt states she has been trying to get her Clonazepam filled for 2 weeks but pharmacy says they don't have a rx for it. I called [facility] off [address] and and verified they never received the rx from 08/05/19. The last rx they have on file for Clonazepam was from June of 2020 from an Urgent Care. Can you send in rx in or does she ntbs?

## 2019-09-24 NOTE — Telephone Encounter (Signed)
Pt aware rx sent to pharmacy.

## 2019-09-24 NOTE — Telephone Encounter (Signed)
Bringing you reports

## 2020-03-04 ENCOUNTER — Ambulatory Visit: Payer: Medicaid Other | Admitting: Family Medicine

## 2020-03-18 ENCOUNTER — Other Ambulatory Visit: Payer: Self-pay

## 2020-03-18 ENCOUNTER — Ambulatory Visit: Payer: Medicaid Other | Admitting: Family Medicine

## 2020-03-18 ENCOUNTER — Encounter: Payer: Self-pay | Admitting: Family Medicine

## 2020-03-18 VITALS — BP 108/74 | HR 90 | Temp 98.0°F | Ht 59.0 in | Wt 219.4 lb

## 2020-03-18 DIAGNOSIS — F411 Generalized anxiety disorder: Secondary | ICD-10-CM | POA: Diagnosis not present

## 2020-03-18 DIAGNOSIS — Z124 Encounter for screening for malignant neoplasm of cervix: Secondary | ICD-10-CM

## 2020-03-18 DIAGNOSIS — F332 Major depressive disorder, recurrent severe without psychotic features: Secondary | ICD-10-CM

## 2020-03-18 DIAGNOSIS — R7303 Prediabetes: Secondary | ICD-10-CM

## 2020-03-18 DIAGNOSIS — J4520 Mild intermittent asthma, uncomplicated: Secondary | ICD-10-CM | POA: Diagnosis not present

## 2020-03-18 DIAGNOSIS — F4310 Post-traumatic stress disorder, unspecified: Secondary | ICD-10-CM | POA: Diagnosis not present

## 2020-03-18 DIAGNOSIS — Z23 Encounter for immunization: Secondary | ICD-10-CM

## 2020-03-18 LAB — BAYER DCA HB A1C WAIVED: HB A1C (BAYER DCA - WAIVED): 5.7 % (ref <7.0)

## 2020-03-18 MED ORDER — TRAZODONE HCL 50 MG PO TABS: 50.0000 mg | ORAL_TABLET | Freq: Every day | ORAL | 1 refills | Status: DC

## 2020-03-18 MED ORDER — PRAZOSIN HCL 2 MG PO CAPS: 4.0000 mg | ORAL_CAPSULE | Freq: Every day | ORAL | 1 refills | Status: AC

## 2020-03-18 MED ORDER — CLONAZEPAM 0.5 MG PO TABS: 0.5000 mg | ORAL_TABLET | Freq: Three times a day (TID) | ORAL | 2 refills | Status: AC | PRN

## 2020-03-18 MED ORDER — ALBUTEROL SULFATE HFA 108 (90 BASE) MCG/ACT IN AERS: 2.0000 | INHALATION_SPRAY | Freq: Four times a day (QID) | RESPIRATORY_TRACT | 2 refills | Status: AC | PRN

## 2020-03-18 MED ORDER — TOPIRAMATE 50 MG PO TABS: 50.0000 mg | ORAL_TABLET | Freq: Two times a day (BID) | ORAL | 2 refills | Status: DC

## 2020-03-18 MED ORDER — METFORMIN HCL 500 MG PO TABS: 500.0000 mg | ORAL_TABLET | Freq: Every day | ORAL | 1 refills | Status: DC

## 2020-03-18 MED ORDER — ZIPRASIDONE HCL 60 MG PO CAPS: 60.0000 mg | ORAL_CAPSULE | Freq: Every day | ORAL | 1 refills | Status: DC

## 2020-03-18 MED ORDER — TETANUS-DIPHTH-ACELL PERTUSSIS 5-2.5-18.5 LF-MCG/0.5 IM SUSP: 0.5000 mL | Freq: Once | INTRAMUSCULAR | 0 refills | Status: AC
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
## 2020-03-19 LAB — CBC WITH DIFFERENTIAL/PLATELET
Basophils Absolute: 0 10*3/uL (ref 0.0–0.2)
Basos: 0 %
EOS (ABSOLUTE): 0.2 10*3/uL (ref 0.0–0.4)
Eos: 2 %
Hematocrit: 44.8 % (ref 34.0–46.6)
Hemoglobin: 15.2 g/dL (ref 11.1–15.9)
Immature Grans (Abs): 0 10*3/uL (ref 0.0–0.1)
Immature Granulocytes: 0 %
Lymphocytes Absolute: 3.8 10*3/uL — ABNORMAL HIGH (ref 0.7–3.1)
Lymphs: 39 %
MCH: 29.7 pg (ref 26.6–33.0)
MCHC: 33.9 g/dL (ref 31.5–35.7)
MCV: 88 fL (ref 79–97)
Monocytes Absolute: 0.6 10*3/uL (ref 0.1–0.9)
Monocytes: 6 %
Neutrophils Absolute: 5 10*3/uL (ref 1.4–7.0)
Neutrophils: 53 %
Platelets: 312 10*3/uL (ref 150–450)
RBC: 5.11 x10E6/uL (ref 3.77–5.28)
RDW: 12.7 % (ref 11.7–15.4)
WBC: 9.6 10*3/uL (ref 3.4–10.8)

## 2020-03-19 LAB — CMP14+EGFR
ALT: 26 IU/L (ref 0–32)
AST: 24 IU/L (ref 0–40)
Albumin/Globulin Ratio: 1.3 (ref 1.2–2.2)
Albumin: 4.3 g/dL (ref 3.9–5.0)
Alkaline Phosphatase: 75 IU/L (ref 48–121)
BUN/Creatinine Ratio: 12 (ref 9–23)
BUN: 9 mg/dL (ref 6–20)
Bilirubin Total: 0.4 mg/dL (ref 0.0–1.2)
CO2: 21 mmol/L (ref 20–29)
Calcium: 10.2 mg/dL (ref 8.7–10.2)
Chloride: 104 mmol/L (ref 96–106)
Creatinine, Ser: 0.74 mg/dL (ref 0.57–1.00)
GFR calc Af Amer: 127 mL/min/{1.73_m2} (ref >59)
GFR calc non Af Amer: 111 mL/min/{1.73_m2} (ref >59)
Globulin, Total: 3.2 g/dL (ref 1.5–4.5)
Glucose: 120 mg/dL — ABNORMAL HIGH (ref 65–99)
Potassium: 4 mmol/L (ref 3.5–5.2)
Sodium: 142 mmol/L (ref 134–144)
Total Protein: 7.5 g/dL (ref 6.0–8.5)

## 2020-03-19 LAB — LIPID PANEL
Chol/HDL Ratio: 5.4 ratio — ABNORMAL HIGH (ref 0.0–4.4)
Cholesterol, Total: 254 mg/dL — ABNORMAL HIGH (ref 100–199)
HDL: 47 mg/dL (ref >39)
LDL Chol Calc (NIH): 170 mg/dL — ABNORMAL HIGH (ref 0–99)
Triglycerides: 201 mg/dL — ABNORMAL HIGH (ref 0–149)
VLDL Cholesterol Cal: 37 mg/dL (ref 5–40)

## 2020-03-22 ENCOUNTER — Encounter: Payer: Self-pay | Admitting: Family Medicine

## 2020-03-22 DIAGNOSIS — E782 Mixed hyperlipidemia: Secondary | ICD-10-CM | POA: Insufficient documentation

## 2020-03-22 LAB — COMPLIANCE DRUG ANALYSIS, UR

## 2020-04-28 ENCOUNTER — Other Ambulatory Visit: Payer: Self-pay | Admitting: *Deleted

## 2020-04-28 DIAGNOSIS — F4310 Post-traumatic stress disorder, unspecified: Secondary | ICD-10-CM

## 2020-04-28 DIAGNOSIS — F411 Generalized anxiety disorder: Secondary | ICD-10-CM

## 2020-05-16 ENCOUNTER — Encounter (HOSPITAL_COMMUNITY): Payer: Self-pay

## 2020-05-16 ENCOUNTER — Inpatient Hospital Stay
Admission: RE | Admit: 2020-05-16 | Discharge: 2020-05-16 | Disposition: A | Discharge status: Home / Self Care | Payer: Medicaid Other | Source: Ambulatory Visit

## 2020-05-16 ENCOUNTER — Other Ambulatory Visit: Payer: Self-pay

## 2020-05-16 ENCOUNTER — Ambulatory Visit (HOSPITAL_COMMUNITY)
Admission: EM | Admit: 2020-05-16 | Discharge: 2020-05-16 | Disposition: A | Discharge status: Home / Self Care | Payer: Medicaid Other | Attending: Family Medicine | Admitting: Family Medicine

## 2020-05-16 DIAGNOSIS — R1013 Epigastric pain: Secondary | ICD-10-CM | POA: Diagnosis not present

## 2020-05-16 MED ORDER — SIMETHICONE 80 MG PO CHEW
80.0000 mg | CHEWABLE_TABLET | Freq: Four times a day (QID) | ORAL | 0 refills | Status: DC | PRN
Start: 2020-05-16 — End: 2020-10-10

## 2020-05-16 MED ORDER — OMEPRAZOLE 20 MG PO CPDR
20.0000 mg | DELAYED_RELEASE_CAPSULE | Freq: Every day | ORAL | 0 refills | Status: DC
Start: 2020-05-16 — End: 2020-10-10

## 2020-05-16 NOTE — ED Provider Notes (Signed)
[place]    CSN: [id number] Arrival date & time: 05/16/20  1446      History   Chief Complaint Chief Complaint  Patient presents with  . Abdominal Pain  . Bloated  . Anorexia  . Weight Loss    HPI Kelly Barry is a 29 y.o. female.   HPI  Patient is here for abdominal pain.  She states she said bloating, belching, abdominal pain, and anorexia for the last week to 2.  She states she had H. pylori when she was approximately 19 (10 years ago) and took antibiotics for this.  She thinks it went away completely.  She has not had any symptoms until recently.  She does not drink alcohol.  She does not take a lot of anti-inflammatory medicine.  She states she is under stress.  She has an implant for birth control pills.  Does not have menstrual periods  Past Medical History:  Diagnosis Date  . Adopted   . Anxiety   . Asthma   . Bipolar disorder ([place])   . Borderline personality disorder ([place])   . Depression   . GERD (gastroesophageal reflux disease)   . Headache    Migraines  . Mixed hyperlipidemia   . PTSD (post-traumatic stress disorder)   . Suicide attempt [facility])     Patient Active Problem List   Diagnosis Date Noted  . Mixed hyperlipidemia   . Prediabetes 05/08/2019  . COVID-19 04/29/2019  . Asthma 04/29/2019  . Transaminitis 04/29/2019  . GAD (generalized anxiety disorder) 03/10/2019  . Depression, major, single episode, moderate ([place]) 03/10/2019  . History of miscarriage 02/21/2018  . Mood disorder ([place]) 07/04/2017  . PTSD (post-traumatic stress disorder) 01/14/2017  . Suicidal ideations 03/28/2016  . MDD (major depressive disorder), recurrent episode, severe ([place]) 03/27/2016    Past Surgical History:  Procedure Laterality Date  . DILATION AND EVACUATION N/A 01/02/2018   Procedure: ULTRASOUND GUIDED DILATATION AND EVACUATION;  Surgeon: Servando Salina, MD;  Location: [facility];  Service: Gynecology;  Laterality: N/A;  .  ESOPHAGOGASTRODUODENOSCOPY  06/25/2012   Procedure: ESOPHAGOGASTRODUODENOSCOPY (EGD);  Surgeon: Arta Silence, MD;  Location: [facility];  Service: Endoscopy;  Laterality: N/A;  [name]/ebp  . GANGLION CYST EXCISION    . HYSTEROSCOPY N/A 10/20/2015   Procedure: HYSTEROSCOPY;  Surgeon: Janyth Pupa, DO;  Location: [facility];  Service: Gynecology;  Laterality: N/A;  . IUD REMOVAL N/A 10/20/2015   Procedure: INTRAUTERINE DEVICE (IUD) REMOVAL;  Surgeon: Janyth Pupa, DO;  Location: [facility];  Service: Gynecology;  Laterality: N/A;    OB History    Gravida  1   Para      Term      Preterm      AB  1   Living        SAB  1   TAB      Ectopic      Multiple      Live Births               Home Medications    Prior to Admission medications   Medication Sig Start Date End Date Taking? Authorizing Provider  acetaminophen (TYLENOL) 325 MG tablet Take 2 tablets (650 mg total) by mouth every 6 (six) hours as needed. Do not take more than 4000mg  of tylenol per day 05/09/19  Yes Couture, Cortni S, PA-C  albuterol (VENTOLIN HFA) 108 (90 Base) MCG/ACT inhaler Inhale 2 puffs into the lungs every 6 (six) hours as needed for wheezing  or shortness of breath. 03/18/20  Yes Loman Brooklyn, FNP  clonazePAM (KLONOPIN) 0.5 MG tablet Take 1 tablet (0.5 mg total) by mouth 3 (three) times daily as needed. 03/18/20  Yes Hendricks Limes F, FNP  ibuprofen (ADVIL) 400 MG tablet Take 1 tablet (400 mg total) by mouth every 6 (six) hours as needed. 05/09/19  Yes Couture, Cortni S, PA-C  metFORMIN (GLUCOPHAGE) 500 MG tablet Take 1 tablet (500 mg total) by mouth daily before supper. 03/18/20  Yes Hendricks Limes F, FNP  prazosin (MINIPRESS) 2 MG capsule Take 2 capsules (4 mg total) by mouth at bedtime. 03/18/20  Yes Loman Brooklyn, FNP  topiramate (TOPAMAX) 50 MG tablet Take 1 tablet (50 mg total) by mouth 2 (two) times daily. 03/18/20  Yes Hendricks Limes F, FNP  traZODone (DESYREL) 50 MG tablet Take 1 tablet  (50 mg total) by mouth at bedtime. 03/18/20  Yes Hendricks Limes F, FNP  ziprasidone (GEODON) 60 MG capsule Take 1 capsule (60 mg total) by mouth at bedtime. 03/18/20  Yes Loman Brooklyn, FNP  omeprazole (PRILOSEC) 20 MG capsule Take 1 capsule (20 mg total) by mouth daily. 05/16/20   Raylene Everts, MD  simethicone (GAS-X) 80 MG chewable tablet Chew 1 tablet (80 mg total) by mouth every 6 (six) hours as needed for flatulence. 05/16/20   Raylene Everts, MD  diphenhydrAMINE HCl, Sleep, 25 MG CAPS Take by mouth.  05/16/20  [provider]    Family History Family History  Adopted: Yes    Social History Social History   Tobacco Use  . Smoking status: Former Smoker    Packs/day: 0.10    Years: 0.20    Pack years: 0.02    Types: Cigarettes  . Smokeless tobacco: Never Used  Vaping Use  . Vaping Use: Never used  Substance Use Topics  . Alcohol use: No  . Drug use: No     Allergies   Diclofenac, Hydroxyzine, Lactose intolerance (gi), and Icy hot   Review of Systems Review of Systems See HPI  Physical Exam Triage Vital Signs ED Triage Vitals  Enc Vitals Group     BP 05/16/20 1604 111/77     Pulse Rate 05/16/20 1604 95     Resp 05/16/20 1604 18     Temp 05/16/20 1604 98.9 F (37.2 C)     Temp Source 05/16/20 1604 Oral     SpO2 05/16/20 1604 98 %     Weight 05/16/20 1603 (!) 206 lb 12.8 oz (93.8 kg)     Height 05/16/20 1603 4\' 11"  (1.499 m)     Head Circumference --      Peak Flow --      Pain Score 05/16/20 1607 2     Pain Loc --      Pain Edu? --      Excl. in [place]? --    No data found.  Updated Vital Signs BP 111/77 (BP Location: Right Arm)   Pulse 95   Temp 98.9 F (37.2 C) (Oral)   Resp 18   Ht 4\' 11"  (1.499 m)   Wt (!) 93.8 kg   SpO2 98%   BMI 41.77 kg/m      Physical Exam Constitutional:      General: She is not in acute distress.    Appearance: She is well-developed.  HENT:     Head: Normocephalic and atraumatic.  Eyes:      Conjunctiva/sclera: Conjunctivae normal.  Pupils: Pupils are equal, round, and reactive to light.  Cardiovascular:     Rate and Rhythm: Normal rate.  Pulmonary:     Effort: Pulmonary effort is normal. No respiratory distress.     Breath sounds: Normal breath sounds.  Abdominal:     General: Bowel sounds are normal. There is no distension.     Palpations: Abdomen is soft.     Tenderness: There is abdominal tenderness.     Comments: Rounded abdomen.  Soft.  Tenderness to deep palpation the  central mid epigastrium.  No organomegaly  Musculoskeletal:        General: Normal range of motion.     Cervical back: Normal range of motion.  Skin:    General: Skin is warm and dry.  Neurological:     Mental Status: She is alert.  Psychiatric:        Mood and Affect: Mood normal.        Behavior: Behavior normal.      UC Treatments / Results  Labs (all labs ordered are listed, but only abnormal results are displayed) Labs Reviewed - No data to display  EKG   Radiology No results found.  Procedures Procedures (including critical care time)  Medications Ordered in UC Medications - No data to display  Initial Impression / Assessment and Plan / UC Course  I have reviewed the triage vital signs and the nursing notes.  Pertinent labs & imaging results that were available during my care of the patient were reviewed by me and considered in my medical decision making (see chart for details).     Patient has symptoms consistent with GERD.  We will put her back on omeprazole.  She complains of having a lot of gas.  She states she eats nothing but "white meat and vegetables".  I told her that some vegetables are gas-forming into try cutting back on those that bother her Final Clinical Impressions(s) / UC Diagnoses   Final diagnoses:  Epigastric pain     Discharge Instructions     Take the omeprazole once a day This will reduce stomach acid Take the simethicone as needed for  gas See a PCP in follow up   ED Prescriptions    Medication Sig Dispense Auth. Provider   omeprazole (PRILOSEC) 20 MG capsule Take 1 capsule (20 mg total) by mouth daily. 30 capsule Raylene Everts, MD   simethicone (GAS-X) 80 MG chewable tablet Chew 1 tablet (80 mg total) by mouth every 6 (six) hours as needed for flatulence. 30 tablet Raylene Everts, MD     PDMP not reviewed this encounter.   Raylene Everts, MD 05/16/20 2200

## 2020-05-16 NOTE — Discharge Instructions (Signed)
Take the omeprazole once a day This will reduce stomach acid Take the simethicone as needed for gas See a PCP in follow up

## 2020-05-16 NOTE — ED Triage Notes (Signed)
Patient in today w/ c/o bloating, cramping, loss of appetite, weight loss of 5 lb since last week. Patient states she has a history of H. Pylori. Patient does not have current PCP due to insurance.

## 2020-06-08 ENCOUNTER — Ambulatory Visit (INDEPENDENT_AMBULATORY_CARE_PROVIDER_SITE_OTHER): Payer: Medicaid Other | Admitting: Nurse Practitioner

## 2020-06-08 ENCOUNTER — Other Ambulatory Visit: Payer: Self-pay

## 2020-06-08 ENCOUNTER — Other Ambulatory Visit (HOSPITAL_COMMUNITY)
Admission: RE | Admit: 2020-06-08 | Discharge: 2020-06-08 | Disposition: A | Discharge status: Home / Self Care | Payer: Medicaid Other | Source: Ambulatory Visit | Attending: Nurse Practitioner | Admitting: Nurse Practitioner

## 2020-06-08 ENCOUNTER — Encounter: Payer: Self-pay | Admitting: Nurse Practitioner

## 2020-06-08 VITALS — BP 100/75 | HR 76 | Ht 59.0 in | Wt 204.2 lb

## 2020-06-08 DIAGNOSIS — N898 Other specified noninflammatory disorders of vagina: Secondary | ICD-10-CM | POA: Insufficient documentation

## 2020-06-08 DIAGNOSIS — Z6841 Body Mass Index (BMI) 40.0 and over, adult: Secondary | ICD-10-CM | POA: Diagnosis not present

## 2020-06-08 DIAGNOSIS — Z01419 Encounter for gynecological examination (general) (routine) without abnormal findings: Secondary | ICD-10-CM | POA: Diagnosis not present

## 2020-06-08 DIAGNOSIS — Z62810 Personal history of physical and sexual abuse in childhood: Secondary | ICD-10-CM
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
## 2020-06-08 NOTE — Progress Notes (Signed)
Pt. Presents for annual exam and to establish care Pt. Denies any STI testing at this time  Pt. Would like to discuss tubal and removal of nexplanon

## 2020-06-09 LAB — CERVICOVAGINAL ANCILLARY ONLY
Bacterial Vaginitis (gardnerella): NEGATIVE
Candida Glabrata: NEGATIVE
Candida Vaginitis: POSITIVE — AB
Chlamydia: NEGATIVE
Comment: NEGATIVE
Comment: NEGATIVE
Comment: NEGATIVE
Comment: NEGATIVE
Comment: NEGATIVE
Comment: NORMAL
Neisseria Gonorrhea: NEGATIVE
Trichomonas: NEGATIVE

## 2020-06-09 LAB — CYTOLOGY - PAP
Adequacy: ABSENT
Comment: NEGATIVE
Diagnosis: NEGATIVE
High risk HPV: NEGATIVE

## 2020-06-09 MED ORDER — FLUCONAZOLE 150 MG PO TABS
ORAL_TABLET | ORAL | 0 refills | Status: DC
Start: 2020-06-09 — End: 2020-08-17

## 2020-06-09 NOTE — Addendum Note (Signed)
Addended by: Virginia Rochester on: 06/09/2020 08:00 PM   Modules accepted: Orders

## 2020-06-13 ENCOUNTER — Telehealth: Payer: Self-pay
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
## 2020-06-21 ENCOUNTER — Ambulatory Visit: Payer: Medicaid Other | Admitting: Obstetrics and Gynecology

## 2020-06-24 ENCOUNTER — Other Ambulatory Visit: Payer: Self-pay | Admitting: Family Medicine

## 2020-07-14 IMAGING — DX PORTABLE CHEST - 1 VIEW
1 series · 1 of 1 positions shown · non-contrast
Comparison: 04/28/2019

CLINICAL DATA: [id number] pneumonia

EXAM:
PORTABLE CHEST 1 VIEW

[chest]
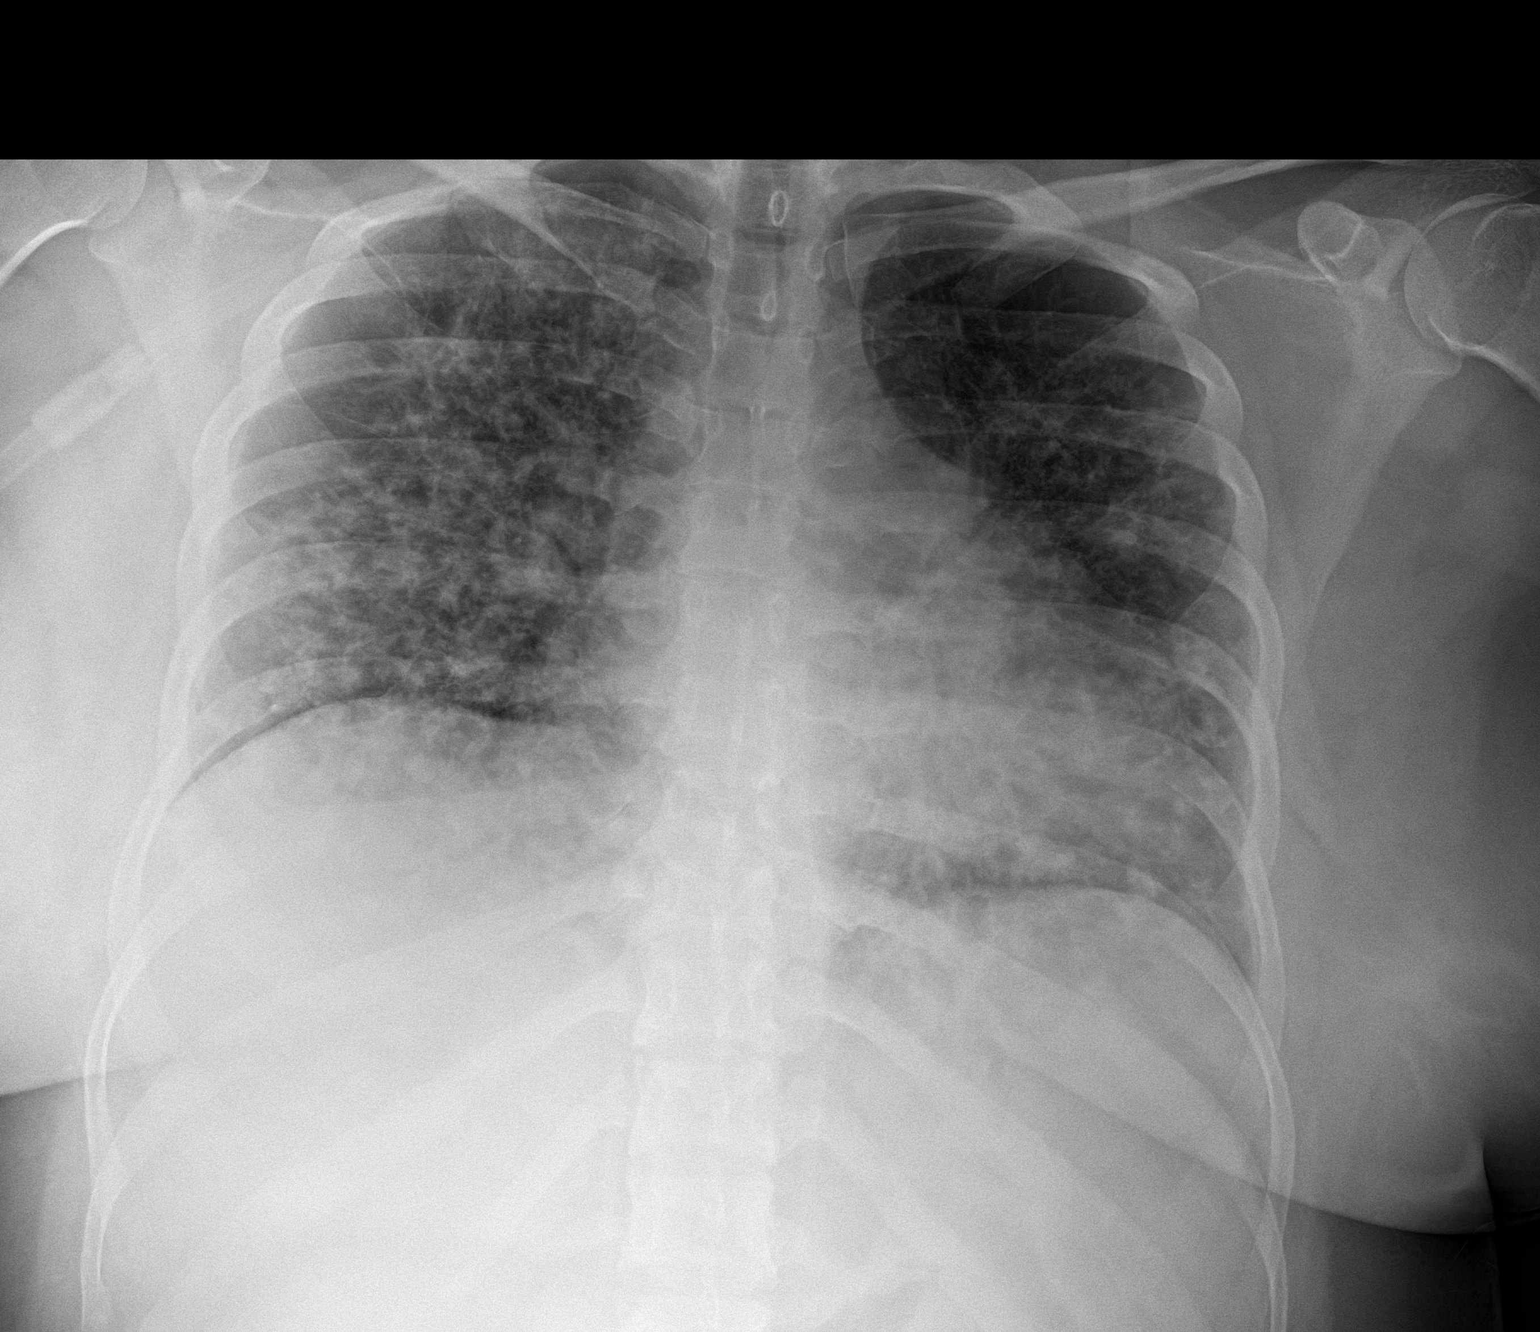

[1 of 1 positions shown; findings below may reference images not displayed]

FINDINGS: The cardiomediastinal contours are somewhat prominent but some of
this is due to the AP projection and portable technique.

Persistent diffuse patchy interstitial and airspace infiltrates but
improved lung aeration partly due to better inspiration. No pleural
effusions.
IMPRESSION: Persistent patchy bilateral interstitial and airspace infiltrates
with slight improved aeration at least partly due to better
inspiratory effort.

## 2020-07-23 IMAGING — DX PORTABLE CHEST - 1 VIEW
1 series · 1 of 1 positions shown · non-contrast
Comparison: April 29, 2019

CLINICAL DATA: [id number] positive.  Chest pain, cough

EXAM:
PORTABLE CHEST 1 VIEW

[chest ap]
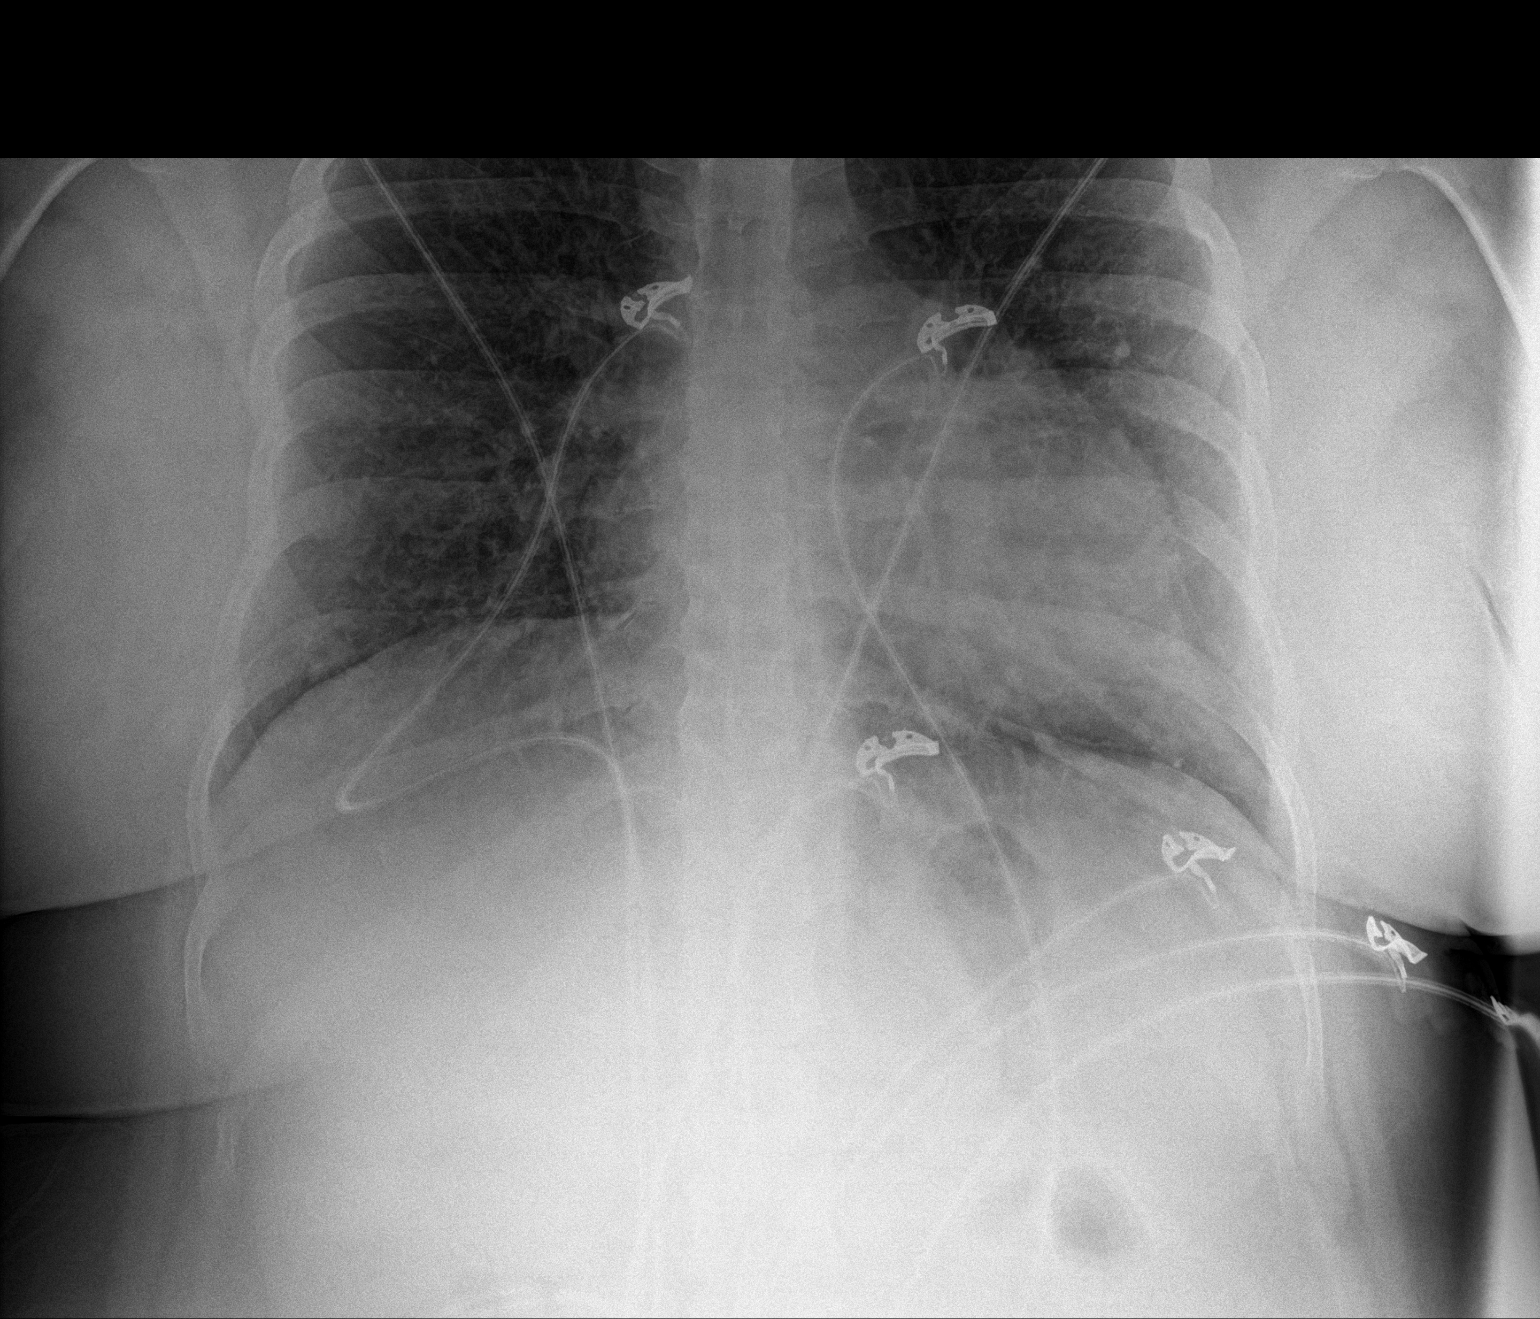

[1 of 1 positions shown; findings below may reference images not displayed]

FINDINGS: Heart is borderline in size. Mild interstitial prominence throughout
the lungs, but improving interstitial and alveolar airspace opacity
since prior study. No effusions or acute bony abnormality.
IMPRESSION: Improving interstitial and airspace opacities since prior study.

## 2020-07-29 ENCOUNTER — Other Ambulatory Visit: Payer: Self-pay | Admitting: Family Medicine

## 2020-07-29 NOTE — Telephone Encounter (Signed)
Left message for patient to call back to schedule an appointment for medication refills. 

## 2020-07-29 NOTE — Telephone Encounter (Signed)
Kelly Barry NTBS 30 days given 06/24/20

## 2020-08-10 ENCOUNTER — Encounter: Payer: Self-pay | Admitting: Obstetrics and Gynecology

## 2020-08-10 ENCOUNTER — Ambulatory Visit (INDEPENDENT_AMBULATORY_CARE_PROVIDER_SITE_OTHER): Payer: Medicaid Other | Admitting: Obstetrics and Gynecology

## 2020-08-10 ENCOUNTER — Other Ambulatory Visit: Payer: Self-pay

## 2020-08-10 DIAGNOSIS — Z3009 Encounter for other general counseling and advice on contraception: Secondary | ICD-10-CM
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
## 2020-08-10 NOTE — Progress Notes (Signed)
Pt is here for contraception counseling.   Pt reports she is interested in BTL.    Pt is not currently sexually active.   LMP: Pt does not have periods, she currently has nexplanon.

## 2020-08-10 NOTE — Patient Instructions (Signed)
Salpingectomy Salpingectomy, also called tubectomy, is the surgical removal of one of the fallopian tubes. The fallopian tubes are where eggs travel from the ovaries to the uterus. Removing one fallopian tube does not prevent you from becoming pregnant. It also does not cause problems with your menstrual periods. You may need this procedure if you:  Have a fertilized egg that attaches to the fallopian tube (ectopic pregnancy), especially one that causes the tube to burst or tear (rupture).  Have an infected fallopian tube.  Have cancer of the fallopian tube or nearby organs.  Have had an ovary removed due to a cyst or tumor.  Have had your uterus removed.  Are at high risk for ovarian cancer. There are three different methods that can be used for a salpingectomy:  An open method that involves making one large incision in your abdomen.  A laparoscopic method that involves using a thin, lighted tube with a tiny camera on the end (laparoscope) to help perform the procedure. The laparoscope will allow your surgeon to make several small incisions in the abdomen instead of one large incision.  A robot-assisted method that involves using a computer to control surgical instruments that are attached to robotic arms. Tell a health care provider about:  Any allergies you have.  All medicines you are taking, including vitamins, herbs, eye drops, creams, and over-the-counter medicines.  Any problems you or family members have had with anesthetic medicines.  Any blood disorders you have.  Any surgeries you have had.  Any medical conditions you have.  Whether you are pregnant or may be pregnant. What are the risks? Generally, this is a safe procedure. However, problems may occur, including:  Infection.  Bleeding.  Allergic reactions to medicines.  Blood clots in the legs or lungs.  Damage to other structures or organs. What happens before the procedure? Medicines  Ask your health  care provider about: ? Changing or stopping your regular medicines. This is especially important if you are taking diabetes medicines or blood thinners. ? Taking medicines such as aspirin and ibuprofen. These medicines can thin your blood. Do not take these medicines unless your health care provider tells you to take them. ? Taking over-the-counter medicines, vitamins, herbs, and supplements. Staying hydrated Follow instructions from your health care provider about hydration, which may include:  Up to 2 hours before the procedure - you may continue to drink clear liquids, such as water, clear fruit juice, black coffee, and plain tea. Eating and drinking restrictions Follow instructions from your health care provider about eating and drinking, which may include:  8 hours before the procedure - stop eating heavy meals or foods, such as meat, fried foods, or fatty foods.  6 hours before the procedure - stop eating light meals or foods, such as toast or cereal.  6 hours before the procedure - stop drinking milk or drinks that contain milk.  2 hours before the procedure - stop drinking clear liquids. General instructions  Do not use any products that contain nicotine or tobacco for at least 4 weeks before the procedure. These products include cigarettes, e-cigarettes, and chewing tobacco. If you need help quitting, ask your health care provider.  You may have an exam or tests, such as: ? An electrocardiogram (ECG). ? A blood or urine test.  Ask your health care provider what steps will be taken to help prevent infection. These may include: ? Removing hair at the surgery site. ? Washing skin with a germ-killing soap. ?   Taking antibiotic medicine.  Plan to have someone take you home from the hospital or clinic.  If you will be going home right after the procedure, plan to have someone with you for 24 hours. What happens during the procedure?  An IV will be inserted into one of your  veins.  You will be given one or more of the following: ? A medicine to help you relax (sedative). ? A medicine to make you fall asleep (general anesthetic).  A thin tube (catheter) may be inserted through your urethra and into your bladder to drain urine during your procedure.  Depending on the type of procedure you are having, one incision or several small incisions will be made in your abdomen.  Your fallopian tube will be cut and removed from where it attaches to your uterus.  Your blood vessels will be clamped and tied to prevent excess bleeding.  The incisions in your abdomen will be closed with stitches (sutures), staples, or skin glue.  A bandage (dressing) may be placed over your incisions. The procedure may vary among health care providers and hospitals. What happens after the procedure?   Your blood pressure, heart rate, breathing rate, and blood oxygen level will be monitored until you leave the hospital.  You may continue to receive fluids and medicines through an IV.  You may continue to have a catheter draining your urine.  You may have to wear compression stockings. These stockings help to prevent blood clots and reduce swelling in your legs.  You will be given pain medicine as needed.  Do not drive for 24 hours if you were given a sedative during your procedure. Summary  Salpingectomy is a surgical procedure to remove one of the fallopian tubes.  The procedure may be done with an open incision, a laparoscope, or computer-controlled instruments.  Depending on the type of procedure you are having, one incision or several small incisions will be made in your abdomen.  Your blood pressure, heart rate, breathing rate, and blood oxygen level will be monitored until you leave the hospital.  Plan to have someone take you home from the hospital or clinic. This information is not intended to replace advice given to you by your health care provider. Make sure you  discuss any questions you have with your health care provider. Document Revised: 09/29/2018 Document Reviewed: 09/29/2018 Elsevier Patient Education  [place]. Laparoscopic Tubal Ligation Laparoscopic tubal ligation is a procedure to close the fallopian tubes. This is done so that you cannot get pregnant. When the fallopian tubes are closed, the eggs that your ovaries release cannot enter the uterus, and sperm cannot reach the released eggs. You should not have this procedure if you want to get pregnant someday or if you are unsure about having more children. Tell a health care provider about:  Any allergies you have.  All medicines you are taking, including vitamins, herbs, eye drops, creams, and over-the-counter medicines.  Any problems you or family members have had with anesthetic medicines.  Any blood disorders you have.  Any surgeries you have had.  Any medical conditions you have.  Whether you are pregnant or may be pregnant.  Any past pregnancies. What are the risks? Generally, this is a safe procedure. However, problems may occur, including:  Infection.  Bleeding.  Injury to other organs in the abdomen.  Side effects from anesthetic medicines.  Failure of the procedure. This procedure can increase your risk of a kind of pregnancy in which  a fertilized egg attaches to the outside of the uterus (ectopic pregnancy). What happens before the procedure? Medicines  Ask your health care provider about: ? Changing or stopping your regular medicines. This is especially important if you are taking diabetes medicines or blood thinners. ? Taking medicines such as aspirin and ibuprofen. These medicines can thin your blood. Do not take these medicines unless your health care provider tells you to take them. ? Taking over-the-counter medicines, vitamins, herbs, and supplements. Staying hydrated  Follow instructions from your health care provider about hydration, which  may include: ? Up to 2 hours before the procedure - you may continue to drink clear liquids, such as water, clear fruit juice, black coffee, and plain tea. Eating and drinking  Follow instructions from your health care provider about eating and drinking, which may include: ? 8 hours before the procedure - stop eating heavy meals or foods, such as meat, fried foods, or fatty foods. ? 6 hours before the procedure - stop eating light meals or foods, such as toast or cereal. ? 6 hours before the procedure - stop drinking milk or drinks that contain milk. ? 2 hours before the procedure - stop drinking clear liquids. General instructions  Do not use any products that contain nicotine or tobacco for at least 4 weeks before the procedure. These products include cigarettes, e-cigarettes, and chewing tobacco. If you need help quitting, ask your health care provider.  Plan to have someone take you home from the hospital.  If you will be going home right after the procedure, plan to have someone with you for 24 hours.  Ask your health care provider: ? How your surgery site will be marked. ? What steps will be taken to help prevent infection. These may include:  Removing hair at the surgery site.  Washing skin with a germ-killing soap.  Taking antibiotic medicine. What happens during the procedure?      An IV will be inserted into one of your veins.  You will be given one or more of the following: ? A medicine to help you relax (sedative). ? A medicine to numb the area (local anesthetic). ? A medicine to make you fall asleep (general anesthetic). ? A medicine that is injected into an area of your body to numb everything below the injection site (regional anesthetic).  Your bladder may be emptied with a small tube (catheter).  If you have been given a general anesthetic, a tube will be put down your throat to help you breathe.  Two small incisions will be made in your lower abdomen and  near your belly button.  Your abdomen will be inflated with a gas. This will let the surgeon see better and will give the surgeon room to work.  A thin, lighted tube (laparoscope) with a camera attached will be inserted into your abdomen through one of the incisions. Small instruments will be inserted through the other incision.  The fallopian tubes will be tied off, burned (cauterized), or blocked with a clip, ring, or clamp. A small portion in the center of each fallopian tube may be removed.  The gas will be released from the abdomen.  The incisions will be closed with stitches (sutures).  A bandage (dressing) will be placed over the incisions. The procedure may vary among health care providers and hospitals. What happens after the procedure?  Your blood pressure, heart rate, breathing rate, and blood oxygen level will be monitored until you leave the hospital.  You will be given medicine to help with pain, nausea, and vomiting as needed. Summary  Laparoscopic tubal ligation is a procedure that is done so that you cannot get pregnant.  You should not have this procedure if you want to get pregnant someday or if you are unsure about having more children.  The procedure is done using a thin, lighted tube (laparoscope) with a camera attached that will be inserted into your abdomen through an incision.  Follow instructions from your health care provider about eating and drinking before the procedure. This information is not intended to replace advice given to you by your health care provider. Make sure you discuss any questions you have with your health care provider. Document Revised: 03/17/2019 Document Reviewed: 09/02/2018 Elsevier Patient Education  2020 [employer].

## 2020-08-17 ENCOUNTER — Other Ambulatory Visit: Payer: Self-pay | Admitting: *Deleted

## 2020-08-17 MED ORDER — FLUCONAZOLE 150 MG PO TABS
150.0000 mg | ORAL_TABLET | Freq: Once | ORAL | 0 refills | Status: AC
Start: 2020-08-17 — End: 2020-08-17

## 2020-08-17 NOTE — Progress Notes (Signed)
Pt called to office with symptoms of yeast infection. Diflucan sent per protocol and reviewed with Dr Elgie Congo.

## 2020-10-04 ENCOUNTER — Other Ambulatory Visit: Payer: Self-pay | Admitting: Family Medicine

## 2020-10-10 ENCOUNTER — Other Ambulatory Visit: Payer: Self-pay

## 2020-10-10 ENCOUNTER — Encounter (HOSPITAL_BASED_OUTPATIENT_CLINIC_OR_DEPARTMENT_OTHER): Payer: Self-pay | Admitting: Obstetrics and Gynecology

## 2020-10-12 ENCOUNTER — Other Ambulatory Visit (INDEPENDENT_AMBULATORY_CARE_PROVIDER_SITE_OTHER): Payer: Medicaid Other | Admitting: Obstetrics and Gynecology

## 2020-10-12 ENCOUNTER — Institutional Professional Consult (permissible substitution): Payer: Medicaid Other | Admitting: Obstetrics and Gynecology

## 2020-10-12 NOTE — Progress Notes (Signed)
preop orders

## 2020-10-13 ENCOUNTER — Other Ambulatory Visit: Payer: Self-pay

## 2020-10-13 ENCOUNTER — Encounter: Payer: Self-pay | Admitting: Obstetrics and Gynecology

## 2020-10-13 ENCOUNTER — Ambulatory Visit (INDEPENDENT_AMBULATORY_CARE_PROVIDER_SITE_OTHER): Payer: Medicaid Other | Admitting: Obstetrics and Gynecology

## 2020-10-13 VITALS — BP 102/73 | HR 94 | Ht 59.0 in | Wt 201.0 lb

## 2020-10-13 DIAGNOSIS — Z3009 Encounter for other general counseling and advice on contraception: Secondary | ICD-10-CM

## 2020-10-13 DIAGNOSIS — Z01818 Encounter for other preprocedural examination: Secondary | ICD-10-CM | POA: Insufficient documentation

## 2020-10-13 NOTE — H&P (View-Only) (Signed)
OB/GYN Pre-Op History and Physical  Caia E Milbourn is a 29 y.o. G1P0010 presenting for preoperative visit for tubal ligation. Patient desires permanent sterilization.  Other reversible forms of contraception (over the counter/barrier methods; hormonal contraceptives including pill, patch, ring, Depo-Provera injection, Nexplanon implant; hormonal IUDs Skyla and Mirena; nonhormonal copper IUD Paragard) were discussed with patient; she declined all these modalities. Also discussed the option of vasectomy for her female partner; she also declined this option. She was given the choice between laparoscopic bilateral tubal sterilization using Filshie clips or laparoscopic bilateral salpingectomy. For the Filshie clip sterilization, she was told she will have one incision in her umbilicus.  Failure risk of 1-2 % with increased risk of ectopic gestation if pregnancy occurs was also discussed with patient. For the bilateral salpingectomy, she was told that both tubes will be resected via three small incisions; the failure risk of less than 1%.  Any future pregnancies will have to be attempted via IVF or other fertility procedures.  Reiterated permanence and irreversibility of both procedures; in the case of Filshie clip application, attempts to reverse tubal sterilization are often not successful.  Also emphasized risk of regret which is noted more in patients less than the age of 22.  All questions were answered. She desires laparoscopic bilateral salpingectomy.  Other risks of the procedure were discussed with patient including but not limited to: bleeding, infection, injury to surrounding organs and need for additional procedures.  Also discussed possibility of post-tubal pain syndrome. Patient verbalized understanding of these risks and wants to proceed with this procedure.   .       Past Medical History:  Diagnosis Date  . Adopted   . Anxiety   . Asthma   . Bipolar disorder ([place])   . Borderline  personality disorder ([place])   . Depression   . GERD (gastroesophageal reflux disease)   . Headache    Migraines  . Mixed hyperlipidemia   . PTSD (post-traumatic stress disorder)   . Suicide attempt [facility])     Past Surgical History:  Procedure Laterality Date  . DILATION AND EVACUATION N/A 01/02/2018   Procedure: ULTRASOUND GUIDED DILATATION AND EVACUATION;  Surgeon: Servando Salina, MD;  Location: [facility];  Service: Gynecology;  Laterality: N/A;  . ESOPHAGOGASTRODUODENOSCOPY  06/25/2012   Procedure: ESOPHAGOGASTRODUODENOSCOPY (EGD);  Surgeon: Arta Silence, MD;  Location: [facility];  Service: Endoscopy;  Laterality: N/A;  [name]/ebp  . GANGLION CYST EXCISION    . HYSTEROSCOPY N/A 10/20/2015   Procedure: HYSTEROSCOPY;  Surgeon: Janyth Pupa, DO;  Location: [facility];  Service: Gynecology;  Laterality: N/A;  . IUD REMOVAL N/A 10/20/2015   Procedure: INTRAUTERINE DEVICE (IUD) REMOVAL;  Surgeon: Janyth Pupa, DO;  Location: [facility];  Service: Gynecology;  Laterality: N/A;    OB History  Gravida Para Term Preterm AB Living  1       1    SAB IAB Ectopic Multiple Live Births  1            # Outcome Date GA Lbr Len/2nd Weight Sex Delivery Anes PTL Lv  1 SAB             Social History   Socioeconomic History  . Marital status: Divorced    Spouse name: Not on file  . Number of children: Not on file  . Years of education: Not on file  . Highest education level: Not on file  Occupational History  . Not on file  Tobacco Use  . Smoking status:  Former Smoker    Packs/day: 0.10    Years: 0.20    Pack years: 0.02    Types: Cigarettes  . Smokeless tobacco: Never Used  Vaping Use  . Vaping Use: Never used  Substance and Sexual Activity  . Alcohol use: No  . Drug use: No  . Sexual activity: Yes    Birth control/protection: None  Other Topics Concern  . Not on file  Social History Narrative  . Not on file   Social Determinants of Health   Financial Resource Strain: Not  on file  Food Insecurity: Not on file  Transportation Needs: Not on file  Physical Activity: Not on file  Stress: Not on file  Social Connections: Not on file    Family History  Adopted: Yes    (Not in a hospital admission)   Allergies  Allergen Reactions  . Diclofenac Anaphylaxis, Swelling and Rash  . Hydroxyzine Other (See Comments)    Headaches....fevers  . Lactose Intolerance (Gi) Diarrhea and Nausea And Vomiting  . Icy Hot Rash    Review of Systems: Negative except for what is mentioned in HPI.     Physical Exam: BP 102/73   Pulse 94   Ht 4' 11" (1.499 m)   Wt 201 lb (91.2 kg)   LMP 10/10/2020   BMI 40.60 kg/m  CONSTITUTIONAL: Well-developed, well-nourished female in no acute distress.  HENT:  Normocephalic, atraumatic, External right and left ear normal. Oropharynx is clear and moist EYES: Conjunctivae and EOM are normal. Pupils are equal, round, and reactive to light. No scleral icterus.  NECK: Normal range of motion, supple, no masses SKIN: Skin is warm and dry. No rash noted. Not diaphoretic. No erythema. No pallor. NEUROLGIC: Alert and oriented to person, place, and time. Normal reflexes, muscle tone coordination. No cranial nerve deficit noted. PSYCHIATRIC: Normal mood and affect. Normal behavior. Normal judgment and thought content. CARDIOVASCULAR: Normal heart rate noted, regular rhythm RESPIRATORY: Effort and breath sounds normal, no problems with respiration noted ABDOMEN: Soft, nontender, nondistended, gravid. Mild obesity PELVIC: cervix easily visualized and WNL MUSCULOSKELETAL: Normal range of motion. No edema and no tenderness. 2+ distal pulses.   Pertinent Labs/Studies:   No results found for this or any previous visit (from the past 72 hour(s)).     Assessment and Plan :Gabrial E Heemstra is a 29 y.o. G1P0010 here for preoperative examination prior to laparoscopic bilateral salpingectomy for female sterilization  Risks and benefits again  given including bleeding, infection, involvement of other organs including bladder and bowel as well as risk of regret.  Alternative forms of contraception were discussed, but pt prefers permanent sterilization.  Domenique Southers, M.D. Attending Obstetrician & Gynecologist, [facility],  [facility] 

## 2020-10-13 NOTE — Progress Notes (Signed)
Patient presents for Pre-Op Visit for Tubal.  Pt currently has UTI and on antibiotics.

## 2020-10-13 NOTE — Progress Notes (Signed)
OB/GYN Pre-Op History and Physical  Kelly Barry is a 29 y.o. G1P0010 presenting for preoperative visit for tubal ligation. Patient desires permanent sterilization.  Other reversible forms of contraception (over the counter/barrier methods; hormonal contraceptives including pill, patch, ring, Depo-Provera injection, Nexplanon implant; hormonal IUDs Skyla and Mirena; nonhormonal copper IUD Paragard) were discussed with patient; she declined all these modalities. Also discussed the option of vasectomy for her female partner; she also declined this option. She was given the choice between laparoscopic bilateral tubal sterilization using Filshie clips or laparoscopic bilateral salpingectomy. For the Filshie clip sterilization, she was told she will have one incision in her umbilicus.  Failure risk of 1-2 % with increased risk of ectopic gestation if pregnancy occurs was also discussed with patient. For the bilateral salpingectomy, she was told that both tubes will be resected via three small incisions; the failure risk of less than 1%.  Any future pregnancies will have to be attempted via IVF or other fertility procedures.  Reiterated permanence and irreversibility of both procedures; in the case of Filshie clip application, attempts to reverse tubal sterilization are often not successful.  Also emphasized risk of regret which is noted more in patients less than the age of 28.  All questions were answered. She desires laparoscopic bilateral salpingectomy.  Other risks of the procedure were discussed with patient including but not limited to: bleeding, infection, injury to surrounding organs and need for additional procedures.  Also discussed possibility of post-tubal pain syndrome. Patient verbalized understanding of these risks and wants to proceed with this procedure.   .       Past Medical History:  Diagnosis Date  . Adopted   . Anxiety   . Asthma   . Bipolar disorder ([place])   . Borderline  personality disorder ([place])   . Depression   . GERD (gastroesophageal reflux disease)   . Headache    Migraines  . Mixed hyperlipidemia   . PTSD (post-traumatic stress disorder)   . Suicide attempt [facility])     Past Surgical History:  Procedure Laterality Date  . DILATION AND EVACUATION N/A 01/02/2018   Procedure: ULTRASOUND GUIDED DILATATION AND EVACUATION;  Surgeon: Servando Salina, MD;  Location: [facility];  Service: Gynecology;  Laterality: N/A;  . ESOPHAGOGASTRODUODENOSCOPY  06/25/2012   Procedure: ESOPHAGOGASTRODUODENOSCOPY (EGD);  Surgeon: Arta Silence, MD;  Location: [facility];  Service: Endoscopy;  Laterality: N/A;  [name]/ebp  . GANGLION CYST EXCISION    . HYSTEROSCOPY N/A 10/20/2015   Procedure: HYSTEROSCOPY;  Surgeon: Janyth Pupa, DO;  Location: [facility];  Service: Gynecology;  Laterality: N/A;  . IUD REMOVAL N/A 10/20/2015   Procedure: INTRAUTERINE DEVICE (IUD) REMOVAL;  Surgeon: Janyth Pupa, DO;  Location: [facility];  Service: Gynecology;  Laterality: N/A;    OB History  Gravida Para Term Preterm AB Living  1       1    SAB IAB Ectopic Multiple Live Births  1            # Outcome Date GA Lbr Len/2nd Weight Sex Delivery Anes PTL Lv  1 SAB             Social History   Socioeconomic History  . Marital status: Divorced    Spouse name: Not on file  . Number of children: Not on file  . Years of education: Not on file  . Highest education level: Not on file  Occupational History  . Not on file  Tobacco Use  . Smoking status:  Former Smoker    Packs/day: 0.10    Years: 0.20    Pack years: 0.02    Types: Cigarettes  . Smokeless tobacco: Never Used  Vaping Use  . Vaping Use: Never used  Substance and Sexual Activity  . Alcohol use: No  . Drug use: No  . Sexual activity: Yes    Birth control/protection: None  Other Topics Concern  . Not on file  Social History Narrative  . Not on file   Social Determinants of Health   Financial Resource Strain: Not  on file  Food Insecurity: Not on file  Transportation Needs: Not on file  Physical Activity: Not on file  Stress: Not on file  Social Connections: Not on file    Family History  Adopted: Yes    (Not in a hospital admission)   Allergies  Allergen Reactions  . Diclofenac Anaphylaxis, Swelling and Rash  . Hydroxyzine Other (See Comments)    Headaches....fevers  . Lactose Intolerance (Gi) Diarrhea and Nausea And Vomiting  . Icy Hot Rash    Review of Systems: Negative except for what is mentioned in HPI.     Physical Exam: BP 102/73   Pulse 94   Ht 4\' 11"  (1.499 m)   Wt 201 lb (91.2 kg)   LMP 10/10/2020   BMI 40.60 kg/m  CONSTITUTIONAL: Well-developed, well-nourished female in no acute distress.  HENT:  Normocephalic, atraumatic, External right and left ear normal. Oropharynx is clear and moist EYES: Conjunctivae and EOM are normal. Pupils are equal, round, and reactive to light. No scleral icterus.  NECK: Normal range of motion, supple, no masses SKIN: Skin is warm and dry. No rash noted. Not diaphoretic. No erythema. No pallor. [place]: Alert and oriented to person, place, and time. Normal reflexes, muscle tone coordination. No cranial nerve deficit noted. PSYCHIATRIC: Normal mood and affect. Normal behavior. Normal judgment and thought content. CARDIOVASCULAR: Normal heart rate noted, regular rhythm RESPIRATORY: Effort and breath sounds normal, no problems with respiration noted ABDOMEN: Soft, nontender, nondistended, gravid. Mild obesity PELVIC: cervix easily visualized and WNL MUSCULOSKELETAL: Normal range of motion. No edema and no tenderness. 2+ distal pulses.   Pertinent Labs/Studies:   No results found for this or any previous visit (from the past 72 hour(s)).     Assessment and Plan :Kelly Barry is a 29 y.o. G1P0010 here for preoperative examination prior to laparoscopic bilateral salpingectomy for female sterilization  Risks and benefits again  given including bleeding, infection, involvement of other organs including bladder and bowel as well as risk of regret.  Alternative forms of contraception were discussed, but pt prefers permanent sterilization.  Lynnda Shields, M.D. Attending [place], [facility] for [employer], [place]

## 2020-10-13 NOTE — Patient Instructions (Signed)
Salpingectomy Salpingectomy, also called tubectomy, is the surgical removal of one of the fallopian tubes. The fallopian tubes are where eggs travel from the ovaries to the uterus. Removing one fallopian tube does not prevent you from becoming pregnant. It also does not cause problems with your menstrual periods. You may need this procedure if you:  Have a fertilized egg that attaches to the fallopian tube (ectopic pregnancy), especially one that causes the tube to burst or tear (rupture).  Have an infected fallopian tube.  Have cancer of the fallopian tube or nearby organs.  Have had an ovary removed due to a cyst or tumor.  Have had your uterus removed.  Are at high risk for ovarian cancer. There are three different methods that can be used for a salpingectomy:  An open method that involves making one large incision in your abdomen.  A laparoscopic method that involves using a thin, lighted tube with a tiny camera on the end (laparoscope) to help perform the procedure. The laparoscope will allow your surgeon to make several small incisions in the abdomen instead of one large incision.  A robot-assisted method that involves using a computer to control surgical instruments that are attached to robotic arms. Tell a health care provider about:  Any allergies you have.  All medicines you are taking, including vitamins, herbs, eye drops, creams, and over-the-counter medicines.  Any problems you or family members have had with anesthetic medicines.  Any blood disorders you have.  Any surgeries you have had.  Any medical conditions you have.  Whether you are pregnant or may be pregnant. What are the risks? Generally, this is a safe procedure. However, problems may occur, including:  Infection.  Bleeding.  Allergic reactions to medicines.  Blood clots in the legs or lungs.  Damage to other structures or organs. What happens before the procedure? Medicines  Ask your health  care provider about: ? Changing or stopping your regular medicines. This is especially important if you are taking diabetes medicines or blood thinners. ? Taking medicines such as aspirin and ibuprofen. These medicines can thin your blood. Do not take these medicines unless your health care provider tells you to take them. ? Taking over-the-counter medicines, vitamins, herbs, and supplements. Staying hydrated Follow instructions from your health care provider about hydration, which may include:  Up to 2 hours before the procedure - you may continue to drink clear liquids, such as water, clear fruit juice, black coffee, and plain tea. Eating and drinking restrictions Follow instructions from your health care provider about eating and drinking, which may include:  8 hours before the procedure - stop eating heavy meals or foods, such as meat, fried foods, or fatty foods.  6 hours before the procedure - stop eating light meals or foods, such as toast or cereal.  6 hours before the procedure - stop drinking milk or drinks that contain milk.  2 hours before the procedure - stop drinking clear liquids. General instructions  Do not use any products that contain nicotine or tobacco for at least 4 weeks before the procedure. These products include cigarettes, e-cigarettes, and chewing tobacco. If you need help quitting, ask your health care provider.  You may have an exam or tests, such as: ? An electrocardiogram (ECG). ? A blood or urine test.  Ask your health care provider what steps will be taken to help prevent infection. These may include: ? Removing hair at the surgery site. ? Washing skin with a germ-killing soap. ?   Taking antibiotic medicine.  Plan to have someone take you home from the hospital or clinic.  If you will be going home right after the procedure, plan to have someone with you for 24 hours. What happens during the procedure?  An IV will be inserted into one of your  veins.  You will be given one or more of the following: ? A medicine to help you relax (sedative). ? A medicine to make you fall asleep (general anesthetic).  A thin tube (catheter) may be inserted through your urethra and into your bladder to drain urine during your procedure.  Depending on the type of procedure you are having, one incision or several small incisions will be made in your abdomen.  Your fallopian tube will be cut and removed from where it attaches to your uterus.  Your blood vessels will be clamped and tied to prevent excess bleeding.  The incisions in your abdomen will be closed with stitches (sutures), staples, or skin glue.  A bandage (dressing) may be placed over your incisions. The procedure may vary among health care providers and hospitals. What happens after the procedure?   Your blood pressure, heart rate, breathing rate, and blood oxygen level will be monitored until you leave the hospital.  You may continue to receive fluids and medicines through an IV.  You may continue to have a catheter draining your urine.  You may have to wear compression stockings. These stockings help to prevent blood clots and reduce swelling in your legs.  You will be given pain medicine as needed.  Do not drive for 24 hours if you were given a sedative during your procedure. Summary  Salpingectomy is a surgical procedure to remove one of the fallopian tubes.  The procedure may be done with an open incision, a laparoscope, or computer-controlled instruments.  Depending on the type of procedure you are having, one incision or several small incisions will be made in your abdomen.  Your blood pressure, heart rate, breathing rate, and blood oxygen level will be monitored until you leave the hospital.  Plan to have someone take you home from the hospital or clinic. This information is not intended to replace advice given to you by your health care provider. Make sure you  discuss any questions you have with your health care provider. Document Revised: 09/29/2018 Document Reviewed: 09/29/2018 Elsevier Patient Education  2020 [employer] Laparoscopic Tubal Ligation Laparoscopic tubal ligation is a procedure to close the fallopian tubes. This is done so that you cannot get pregnant. When the fallopian tubes are closed, the eggs that your ovaries release cannot enter the uterus, and sperm cannot reach the released eggs. You should not have this procedure if you want to get pregnant someday or if you are unsure about having more children. Tell a health care provider about:  Any allergies you have.  All medicines you are taking, including vitamins, herbs, eye drops, creams, and over-the-counter medicines.  Any problems you or family members have had with anesthetic medicines.  Any blood disorders you have.  Any surgeries you have had.  Any medical conditions you have.  Whether you are pregnant or may be pregnant.  Any past pregnancies. What are the risks? Generally, this is a safe procedure. However, problems may occur, including:  Infection.  Bleeding.  Injury to other organs in the abdomen.  Side effects from anesthetic medicines.  Failure of the procedure. This procedure can increase your risk of a kind of pregnancy in which   a fertilized egg attaches to the outside of the uterus (ectopic pregnancy). What happens before the procedure? Medicines  Ask your health care provider about: ? Changing or stopping your regular medicines. This is especially important if you are taking diabetes medicines or blood thinners. ? Taking medicines such as aspirin and ibuprofen. These medicines can thin your blood. Do not take these medicines unless your health care provider tells you to take them. ? Taking over-the-counter medicines, vitamins, herbs, and supplements. Staying hydrated  Follow instructions from your health care provider about hydration, which  may include: ? Up to 2 hours before the procedure - you may continue to drink clear liquids, such as water, clear fruit juice, black coffee, and plain tea. Eating and drinking  Follow instructions from your health care provider about eating and drinking, which may include: ? 8 hours before the procedure - stop eating heavy meals or foods, such as meat, fried foods, or fatty foods. ? 6 hours before the procedure - stop eating light meals or foods, such as toast or cereal. ? 6 hours before the procedure - stop drinking milk or drinks that contain milk. ? 2 hours before the procedure - stop drinking clear liquids. General instructions  Do not use any products that contain nicotine or tobacco for at least 4 weeks before the procedure. These products include cigarettes, e-cigarettes, and chewing tobacco. If you need help quitting, ask your health care provider.  Plan to have someone take you home from the hospital.  If you will be going home right after the procedure, plan to have someone with you for 24 hours.  Ask your health care provider: ? How your surgery site will be marked. ? What steps will be taken to help prevent infection. These may include:  Removing hair at the surgery site.  Washing skin with a germ-killing soap.  Taking antibiotic medicine. What happens during the procedure?      An IV will be inserted into one of your veins.  You will be given one or more of the following: ? A medicine to help you relax (sedative). ? A medicine to numb the area (local anesthetic). ? A medicine to make you fall asleep (general anesthetic). ? A medicine that is injected into an area of your body to numb everything below the injection site (regional anesthetic).  Your bladder may be emptied with a small tube (catheter).  If you have been given a general anesthetic, a tube will be put down your throat to help you breathe.  Two small incisions will be made in your lower abdomen and  near your belly button.  Your abdomen will be inflated with a gas. This will let the surgeon see better and will give the surgeon room to work.  A thin, lighted tube (laparoscope) with a camera attached will be inserted into your abdomen through one of the incisions. Small instruments will be inserted through the other incision.  The fallopian tubes will be tied off, burned (cauterized), or blocked with a clip, ring, or clamp. A small portion in the center of each fallopian tube may be removed.  The gas will be released from the abdomen.  The incisions will be closed with stitches (sutures).  A bandage (dressing) will be placed over the incisions. The procedure may vary among health care providers and hospitals. What happens after the procedure?  Your blood pressure, heart rate, breathing rate, and blood oxygen level will be monitored until you leave the hospital.    You will be given medicine to help with pain, nausea, and vomiting as needed. Summary  Laparoscopic tubal ligation is a procedure that is done so that you cannot get pregnant.  You should not have this procedure if you want to get pregnant someday or if you are unsure about having more children.  The procedure is done using a thin, lighted tube (laparoscope) with a camera attached that will be inserted into your abdomen through an incision.  Follow instructions from your health care provider about eating and drinking before the procedure. This information is not intended to replace advice given to you by your health care provider. Make sure you discuss any questions you have with your health care provider. Document Revised: 03/17/2019 Document Reviewed: 09/02/2018 Elsevier Patient Education  2020 [employer]  

## 2020-10-17 ENCOUNTER — Other Ambulatory Visit (HOSPITAL_COMMUNITY): Payer: Medicaid Other

## 2020-10-18 ENCOUNTER — Other Ambulatory Visit (HOSPITAL_COMMUNITY)
Admission: RE | Admit: 2020-10-18 | Discharge: 2020-10-18 | Disposition: A | Discharge status: Home / Self Care | Payer: Medicaid Other | Source: Ambulatory Visit | Attending: Obstetrics and Gynecology | Admitting: Obstetrics and Gynecology

## 2020-10-18 ENCOUNTER — Encounter (HOSPITAL_BASED_OUTPATIENT_CLINIC_OR_DEPARTMENT_OTHER)
Admission: RE | Admit: 2020-10-18 | Discharge: 2020-10-18 | Disposition: A | Discharge status: Home / Self Care | Payer: Medicaid Other | Source: Ambulatory Visit | Attending: Obstetrics and Gynecology | Admitting: Obstetrics and Gynecology

## 2020-10-18 ENCOUNTER — Telehealth: Payer: Self-pay

## 2020-10-18 DIAGNOSIS — Z01812 Encounter for preprocedural laboratory examination: Secondary | ICD-10-CM | POA: Insufficient documentation

## 2020-10-18 DIAGNOSIS — Z9151 Personal history of suicidal behavior: Secondary | ICD-10-CM | POA: Diagnosis not present

## 2020-10-18 DIAGNOSIS — Z20822 Contact with and (suspected) exposure to covid-19: Secondary | ICD-10-CM | POA: Insufficient documentation

## 2020-10-18 DIAGNOSIS — Z87891 Personal history of nicotine dependence: Secondary | ICD-10-CM | POA: Diagnosis not present

## 2020-10-18 DIAGNOSIS — Z302 Encounter for sterilization: Secondary | ICD-10-CM | POA: Diagnosis present

## 2020-10-18 LAB — CBC
HCT: 41.6 % (ref 36.0–46.0)
Hemoglobin: 14.3 g/dL (ref 12.0–15.0)
MCH: 29.4 pg (ref 26.0–34.0)
MCHC: 34.4 g/dL (ref 30.0–36.0)
MCV: 85.6 fL (ref 80.0–100.0)
Platelets: 275 10*3/uL (ref 150–400)
RBC: 4.86 MIL/uL (ref 3.87–5.11)
RDW: 11.9 % (ref 11.5–15.5)
WBC: 10.6 10*3/uL — ABNORMAL HIGH (ref 4.0–10.5)
nRBC: 0 % (ref 0.0–0.2)

## 2020-10-18 LAB — TYPE AND SCREEN
ABO/RH(D): B POS
Antibody Screen: NEGATIVE

## 2020-10-18 LAB — POCT PREGNANCY, URINE: Preg Test, Ur: NEGATIVE

## 2020-10-18 NOTE — Progress Notes (Addendum)
Left message and sent text to patient with instructions to come in for lab work today and to go get covid test today by 3pm.   Sent message to Dr. Donavan Foil about patient not here for labwork and pt has not gone for covid testing.

## 2020-10-18 NOTE — Telephone Encounter (Addendum)
LVM for pt to c/b  Pt did not show for Covid screening yesterday  **Update - pt arrived today for screening

## 2020-10-19 ENCOUNTER — Encounter (HOSPITAL_BASED_OUTPATIENT_CLINIC_OR_DEPARTMENT_OTHER)
Admission: RE | Disposition: A | Discharge status: Home / Self Care | Payer: Self-pay | Source: Home / Self Care | Attending: Obstetrics and Gynecology

## 2020-10-19 ENCOUNTER — Ambulatory Visit (HOSPITAL_BASED_OUTPATIENT_CLINIC_OR_DEPARTMENT_OTHER): Payer: Medicaid Other | Admitting: Certified Registered"

## 2020-10-19 ENCOUNTER — Encounter (HOSPITAL_BASED_OUTPATIENT_CLINIC_OR_DEPARTMENT_OTHER): Payer: Self-pay | Admitting: Obstetrics and Gynecology

## 2020-10-19 ENCOUNTER — Other Ambulatory Visit: Payer: Self-pay

## 2020-10-19 ENCOUNTER — Ambulatory Visit (HOSPITAL_BASED_OUTPATIENT_CLINIC_OR_DEPARTMENT_OTHER)
Admission: RE | Admit: 2020-10-19 | Discharge: 2020-10-19 | Disposition: A | Discharge status: Home / Self Care | Payer: Medicaid Other | Attending: Obstetrics and Gynecology | Admitting: Obstetrics and Gynecology

## 2020-10-19 DIAGNOSIS — Z9151 Personal history of suicidal behavior: Secondary | ICD-10-CM | POA: Insufficient documentation

## 2020-10-19 DIAGNOSIS — Z87891 Personal history of nicotine dependence: Secondary | ICD-10-CM | POA: Insufficient documentation

## 2020-10-19 DIAGNOSIS — Z302 Encounter for sterilization: Secondary | ICD-10-CM

## 2020-10-19 HISTORY — PX: LAPAROSCOPIC BILATERAL SALPINGECTOMY: SHX5889

## 2020-10-19 LAB — SARS CORONAVIRUS 2 (TAT 6-24 HRS): SARS Coronavirus 2: NEGATIVE

## 2020-10-19 SURGERY — SALPINGECTOMY, BILATERAL, LAPAROSCOPIC
Anesthesia: General | Site: Abdomen | Laterality: Bilateral

## 2020-10-19 MED ORDER — ROCURONIUM BROMIDE 100 MG/10ML IV SOLN
INTRAVENOUS | Status: DC | PRN
  Administered 2020-10-19: 90 mg via INTRAVENOUS

## 2020-10-19 MED ORDER — OXYCODONE HCL 5 MG/5ML PO SOLN: 5.0000 mg | Freq: Once | ORAL | Status: DC | PRN

## 2020-10-19 MED ORDER — AMISULPRIDE (ANTIEMETIC) 5 MG/2ML IV SOLN: 10.0000 mg | Freq: Once | INTRAVENOUS | Status: DC | PRN

## 2020-10-19 MED ORDER — LACTATED RINGERS IV SOLN: INTRAVENOUS | Status: DC

## 2020-10-19 MED ORDER — FENTANYL CITRATE (PF) 100 MCG/2ML IJ SOLN
INTRAMUSCULAR | Status: AC
  Filled 2020-10-19: qty 2

## 2020-10-19 MED ORDER — GABAPENTIN 300 MG PO CAPS
300.0000 mg | ORAL_CAPSULE | ORAL | Status: AC
  Administered 2020-10-19: 12:00:00 300 mg via ORAL

## 2020-10-19 MED ORDER — MIDAZOLAM HCL 5 MG/5ML IJ SOLN
INTRAMUSCULAR | Status: DC | PRN
  Administered 2020-10-19: 2 mg via INTRAVENOUS

## 2020-10-19 MED ORDER — ACETAMINOPHEN 500 MG PO TABS
1000.0000 mg | ORAL_TABLET | ORAL | Status: AC
  Administered 2020-10-19: 12:00:00 1000 mg via ORAL

## 2020-10-19 MED ORDER — SUGAMMADEX SODIUM 500 MG/5ML IV SOLN
INTRAVENOUS | Status: AC
  Filled 2020-10-19: qty 10

## 2020-10-19 MED ORDER — BUPIVACAINE HCL (PF) 0.25 % IJ SOLN
INTRAMUSCULAR | Status: DC | PRN
  Administered 2020-10-19: 14 mL

## 2020-10-19 MED ORDER — BUPIVACAINE HCL (PF) 0.5 % IJ SOLN
INTRAMUSCULAR | Status: AC
  Filled 2020-10-19: qty 30

## 2020-10-19 MED ORDER — PROMETHAZINE HCL 25 MG/ML IJ SOLN
6.2500 mg | INTRAMUSCULAR | Status: DC | PRN
Start: 2020-10-19 — End: 2020-10-19

## 2020-10-19 MED ORDER — LIDOCAINE HCL (PF) 1 % IJ SOLN
INTRAMUSCULAR | Status: AC
  Filled 2020-10-19: qty 30

## 2020-10-19 MED ORDER — KETOROLAC TROMETHAMINE 30 MG/ML IJ SOLN
INTRAMUSCULAR | Status: DC | PRN
  Administered 2020-10-19: 30 mg via INTRAVENOUS

## 2020-10-19 MED ORDER — HYDROMORPHONE HCL 1 MG/ML IJ SOLN
INTRAMUSCULAR | Status: AC
  Filled 2020-10-19: qty 0.5

## 2020-10-19 MED ORDER — MEPERIDINE HCL 25 MG/ML IJ SOLN: 6.2500 mg | INTRAMUSCULAR | Status: DC | PRN

## 2020-10-19 MED ORDER — SUGAMMADEX SODIUM 200 MG/2ML IV SOLN
INTRAVENOUS | Status: DC | PRN
  Administered 2020-10-19: 200 mg via INTRAVENOUS

## 2020-10-19 MED ORDER — CEFAZOLIN SODIUM-DEXTROSE 2-4 GM/100ML-% IV SOLN
INTRAVENOUS | Status: AC
  Filled 2020-10-19: qty 100

## 2020-10-19 MED ORDER — SOD CITRATE-CITRIC ACID 500-334 MG/5ML PO SOLN
30.0000 mL | ORAL | Status: AC
  Administered 2020-10-19: 13:00:00 30 mL via ORAL

## 2020-10-19 MED ORDER — SOD CITRATE-CITRIC ACID 500-334 MG/5ML PO SOLN
ORAL | Status: AC
  Filled 2020-10-19: qty 30

## 2020-10-19 MED ORDER — OXYCODONE-ACETAMINOPHEN 5-325 MG PO TABS: 1.0000 | ORAL_TABLET | ORAL | 0 refills | Status: AC | PRN

## 2020-10-19 MED ORDER — CEFAZOLIN SODIUM-DEXTROSE 2-4 GM/100ML-% IV SOLN
2.0000 g | INTRAVENOUS | Status: AC
  Administered 2020-10-19: 14:00:00 2 g via INTRAVENOUS

## 2020-10-19 MED ORDER — POVIDONE-IODINE 10 % EX SWAB
2.0000 "application " | Freq: Once | CUTANEOUS | Status: AC
  Administered 2020-10-19: 2 via TOPICAL

## 2020-10-19 MED ORDER — GABAPENTIN 300 MG PO CAPS
ORAL_CAPSULE | ORAL | Status: AC
  Filled 2020-10-19: qty 1

## 2020-10-19 MED ORDER — ONDANSETRON HCL 4 MG/2ML IJ SOLN
INTRAMUSCULAR | Status: DC | PRN
  Administered 2020-10-19: 4 mg via INTRAVENOUS

## 2020-10-19 MED ORDER — DEXAMETHASONE SODIUM PHOSPHATE 4 MG/ML IJ SOLN
INTRAMUSCULAR | Status: DC | PRN
  Administered 2020-10-19: 10 mg via INTRAVENOUS

## 2020-10-19 MED ORDER — MIDAZOLAM HCL 2 MG/2ML IJ SOLN
INTRAMUSCULAR | Status: AC
  Filled 2020-10-19: qty 2

## 2020-10-19 MED ORDER — HYDROMORPHONE HCL 1 MG/ML IJ SOLN
0.2500 mg | INTRAMUSCULAR | Status: DC | PRN
  Administered 2020-10-19: 0.5 mg via INTRAVENOUS

## 2020-10-19 MED ORDER — FENTANYL CITRATE (PF) 100 MCG/2ML IJ SOLN
INTRAMUSCULAR | Status: DC | PRN
  Administered 2020-10-19: 100 ug via INTRAVENOUS

## 2020-10-19 MED ORDER — LIDOCAINE HCL (CARDIAC) PF 100 MG/5ML IV SOSY
PREFILLED_SYRINGE | INTRAVENOUS | Status: DC | PRN
  Administered 2020-10-19: 60 mg via INTRAVENOUS

## 2020-10-19 MED ORDER — IBUPROFEN 600 MG PO TABS: 600.0000 mg | ORAL_TABLET | Freq: Four times a day (QID) | ORAL | 2 refills | Status: DC | PRN

## 2020-10-19 MED ORDER — BUPIVACAINE HCL (PF) 0.25 % IJ SOLN
INTRAMUSCULAR | Status: AC
  Filled 2020-10-19: qty 30

## 2020-10-19 MED ORDER — ACETAMINOPHEN 500 MG PO TABS
ORAL_TABLET | ORAL | Status: AC
  Filled 2020-10-19: qty 2

## 2020-10-19 MED ORDER — PROPOFOL 10 MG/ML IV BOLUS
INTRAVENOUS | Status: DC | PRN
  Administered 2020-10-19: 200 mg via INTRAVENOUS

## 2020-10-19 MED ORDER — OXYCODONE HCL 5 MG PO TABS: 5.0000 mg | ORAL_TABLET | Freq: Once | ORAL | Status: DC | PRN

## 2020-10-19 SURGICAL SUPPLY — 44 items
APL SRG 38 LTWT LNG FL B (MISCELLANEOUS)
APPLICATOR ARISTA FLEXITIP XL (MISCELLANEOUS) IMPLANT
BNDG ADH 1X3 SHEER STRL LF (GAUZE/BANDAGES/DRESSINGS) ×9 IMPLANT
BNDG ADH THN 3X1 STRL LF (GAUZE/BANDAGES/DRESSINGS) ×6
CATH ROBINSON RED A/P 16FR (CATHETERS) IMPLANT
COVER WAND RF STERILE (DRAPES) ×3 IMPLANT
DRSG OPSITE POSTOP 3X4 (GAUZE/BANDAGES/DRESSINGS) IMPLANT
DURAPREP 26ML APPLICATOR (WOUND CARE) ×3 IMPLANT
ELECT REM PT RETURN 9FT ADLT (ELECTROSURGICAL) ×3
ELECTRODE REM PT RTRN 9FT ADLT (ELECTROSURGICAL) ×2 IMPLANT
GAUZE 4X4 16PLY RFD (DISPOSABLE) ×3 IMPLANT
GLOVE BIO SURGEON STRL SZ8 (GLOVE) ×3 IMPLANT
GLOVE BIOGEL PI IND STRL 7.0 (GLOVE) ×4 IMPLANT
GLOVE BIOGEL PI INDICATOR 7.0 (GLOVE) ×2
GLOVE SRG 8 PF TXTR STRL LF DI (GLOVE) ×2 IMPLANT
GLOVE SURG SYN 7.0 (GLOVE) ×3 IMPLANT
GLOVE SURG SYN 7.5  E (GLOVE) ×1
GLOVE SURG SYN 7.5 E (GLOVE) ×2 IMPLANT
GLOVE SURG UNDER POLY LF SZ8 (GLOVE) ×3
GOWN STRL REUS W/TWL LRG LVL3 (GOWN DISPOSABLE) ×3 IMPLANT
GOWN STRL REUS W/TWL XL LVL3 (GOWN DISPOSABLE) ×3 IMPLANT
HEMOSTAT ARISTA ABSORB 3G PWDR (HEMOSTASIS) IMPLANT
KIT TURNOVER KIT B (KITS) ×3 IMPLANT
LIGASURE VESSEL 5MM BLUNT TIP (ELECTROSURGICAL) ×3 IMPLANT
NS IRRIG 1000ML POUR BTL (IV SOLUTION) ×3 IMPLANT
PACK LAPAROSCOPY BASIN (CUSTOM PROCEDURE TRAY) ×3 IMPLANT
PACK TRENDGUARD 450 HYBRID PRO (MISCELLANEOUS) IMPLANT
PACK TRENDGUARD 600 HYBRD PROC (MISCELLANEOUS) ×2 IMPLANT
PAD ARMBOARD 7.5X6 YLW CONV (MISCELLANEOUS) ×3 IMPLANT
PAD OB MATERNITY 4.3X12.25 (PERSONAL CARE ITEMS) ×3 IMPLANT
PAD PREP 24X48 CUFFED NSTRL (MISCELLANEOUS) ×3 IMPLANT
PENCIL SMOKE EVACUATOR (MISCELLANEOUS) ×3 IMPLANT
SET TUBE SMOKE EVAC HIGH FLOW (TUBING) ×3 IMPLANT
SLEEVE SCD COMPRESS KNEE MED (MISCELLANEOUS) ×3 IMPLANT
SUT MNCRL AB 4-0 PS2 18 (SUTURE) ×3 IMPLANT
SUT VICRYL 0 UR6 27IN ABS (SUTURE) ×6 IMPLANT
TOWEL GREEN STERILE FF (TOWEL DISPOSABLE) ×6 IMPLANT
TRAY FOLEY W/BAG SLVR 14FR LF (SET/KITS/TRAYS/PACK) ×3 IMPLANT
TRENDGUARD 450 HYBRID PRO PACK (MISCELLANEOUS)
TRENDGUARD 600 HYBRID PROC PK (MISCELLANEOUS) ×3
TROCAR BALLN 12MMX100 BLUNT (TROCAR) IMPLANT
TROCAR XCEL NON-BLD 11X100MML (ENDOMECHANICALS) ×3 IMPLANT
TROCAR XCEL NON-BLD 5MMX100MML (ENDOMECHANICALS) ×6 IMPLANT
WARMER LAPAROSCOPE (MISCELLANEOUS) ×3 IMPLANT

## 2020-10-19 NOTE — Anesthesia Procedure Notes (Signed)
Procedure Name: Intubation Date/Time: 10/19/2020 2:02 PM Performed by: Signe Colt, CRNA Pre-anesthesia Checklist: Patient identified, Emergency Drugs available, Suction available and Patient being monitored Patient Re-evaluated:Patient Re-evaluated prior to induction Oxygen Delivery Method: Circle system utilized Preoxygenation: Pre-oxygenation with 100% oxygen Induction Type: IV induction Ventilation: Mask ventilation without difficulty Laryngoscope Size: Mac and 3 Grade View: Grade I Tube type: Oral Tube size: 7.0 mm Number of attempts: 1 Airway Equipment and Method: Stylet and Oral airway Placement Confirmation: ETT inserted through vocal cords under direct vision,  positive ETCO2 and breath sounds checked- equal and bilateral Secured at: 18 cm Tube secured with: Tape Dental Injury: Teeth and Oropharynx as per pre-operative assessment

## 2020-10-19 NOTE — Anesthesia Preprocedure Evaluation (Signed)
Anesthesia Evaluation  Patient identified by MRN, date of birth, ID band Patient awake    Reviewed: Allergy & Precautions, NPO status , Patient's Chart, lab work & pertinent test results  History of Anesthesia Complications Negative for: history of anesthetic complications  Airway Mallampati: II  TM Distance: >3 FB Neck ROM: Full    Dental  (+) Dental Advisory Given   Pulmonary asthma , COPD: last inhaler needed a week ago., former smoker,    breath sounds clear to auscultation       Cardiovascular negative cardio ROS   Rhythm:Regular Rate:Normal     Neuro/Psych  Headaches, PSYCHIATRIC DISORDERS (PTSD) Anxiety Depression Bipolar Disorder    GI/Hepatic Neg liver ROS, GERD  Controlled,  Endo/Other  Morbid obesity  Renal/GU negative Renal ROS     Musculoskeletal   Abdominal (+) + obese,   Peds  Hematology plt 257k   Anesthesia Other Findings   Reproductive/Obstetrics (+) Pregnancy                             Anesthesia Physical  Anesthesia Plan  ASA: III  Anesthesia Plan: General   Post-op Pain Management:    Induction: Intravenous  PONV Risk Score and Plan: 3 and Treatment may vary due to age or medical condition, Ondansetron, Dexamethasone and Midazolam  Airway Management Planned: Oral ETT  Additional Equipment:   Intra-op Plan:   Post-operative Plan: Extubation in OR  Informed Consent: I have reviewed the patients History and Physical, chart, labs and discussed the procedure including the risks, benefits and alternatives for the proposed anesthesia with the patient or authorized representative who has indicated his/her understanding and acceptance.     Dental advisory given  Plan Discussed with: CRNA and Surgeon  Anesthesia Plan Comments:         Anesthesia Quick Evaluation

## 2020-10-19 NOTE — Discharge Instructions (Signed)
Diagnostic Laparoscopy, Care After This sheet gives you information about how to care for yourself after your procedure. Your health care provider may also give you more specific instructions. If you have problems or questions, contact your health care provider. What can I expect after the procedure? After the procedure, it is common to have:  Mild discomfort in the abdomen.  Sore throat. Women who have laparoscopy with pelvic examination may have mild cramping and fluid coming from the vagina for a few days after the procedure. Follow these instructions at home: Medicines  Take over-the-counter and prescription medicines only as told by your health care provider.  If you were prescribed an antibiotic medicine, take it as told by your health care provider. Do not stop taking the antibiotic even if you start to feel better. Driving  Do not drive for 24 hours if you were given a medicine to help you relax (sedative) during your procedure.  Do not drive or use heavy machinery while taking prescription pain medicine. Bathing  Do not take baths, swim, or use a hot tub until your health care provider approves. You may take showers. Incision care   Follow instructions from your health care provider about how to take care of your incisions. Make sure you: ? Wash your hands with soap and water before you change your bandage (dressing). If soap and water are not available, use hand sanitizer. ? Change your dressing as told by your health care provider. ? Leave stitches (sutures), skin glue, or adhesive strips in place. These skin closures may need to stay in place for 2 weeks or longer. If adhesive strip edges start to loosen and curl up, you may trim the loose edges. Do not remove adhesive strips completely unless your health care provider tells you to do that.  Check your incision areas every day for signs of infection. Check for: ? Redness, swelling, or pain. ? Fluid or  blood. ? Warmth. ? Pus or a bad smell. Activity  Return to your normal activities as told by your health care provider. Ask your health care provider what activities are safe for you.  Do not lift anything that is heavier than 10 lb (4.5 kg), or the limit that you are told, until your health care provider says that it is safe. General instructions  To prevent or treat constipation while you are taking prescription pain medicine, your health care provider may recommend that you: ? Drink enough fluid to keep your urine pale yellow. ? Take over-the-counter or prescription medicines. ? Eat foods that are high in fiber, such as fresh fruits and vegetables, whole grains, and beans. ? Limit foods that are high in fat and processed sugars, such as fried and sweet foods.  Do not use any products that contain nicotine or tobacco, such as cigarettes and e-cigarettes. If you need help quitting, ask your health care provider.  Keep all follow-up visits as told by your health care provider. This is important. Contact a health care provider if:  You develop shoulder pain.  You feel lightheaded or faint.  You are unable to pass gas or have a bowel movement.  You feel nauseous or you vomit.  You develop a rash.  You have redness, swelling, or pain around any incision.  You have fluid or blood coming from any incision.  Any incision feels warm to the touch.  You have pus or a bad smell coming from any incision.  You have a fever or chills. Get help   right away if:  You have severe pain.  You have vomiting that does not go away.  You have heavy bleeding from the vagina.  Any incision opens.  You have trouble breathing.  You have chest pain. Summary  After the procedure, it is common to have mild discomfort in the abdomen and a sore throat.  Check your incision areas every day for signs of infection.  Return to your normal activities as told by your health care provider. Ask  your health care provider what activities are safe for you. This information is not intended to replace advice given to you by your health care provider. Make sure you discuss any questions you have with your health care provider. Document Revised: 09/20/2017 Document Reviewed: 04/03/2017 Elsevier Patient Education  2020 [employer]   May take Tylenol after 6:30 pm, if needed May take Ibuprofen after 8:45 pm, if needed.    Post Anesthesia Home Care Instructions  Activity: Get plenty of rest for the remainder of the day. A responsible individual must stay with you for 24 hours following the procedure.  For the next 24 hours, DO NOT: -Drive a car -Advertising copywriter -Drink alcoholic beverages -Take any medication unless instructed by your physician -Make any legal decisions or sign important papers.  Meals: Start with liquid foods such as gelatin or soup. Progress to regular foods as tolerated. Avoid greasy, spicy, heavy foods. If nausea and/or vomiting occur, drink only clear liquids until the nausea and/or vomiting subsides. Call your physician if vomiting continues.  Special Instructions/Symptoms: Your throat may feel dry or sore from the anesthesia or the breathing tube placed in your throat during surgery. If this causes discomfort, gargle with warm salt water. The discomfort should disappear within 24 hours.  If you had a scopolamine patch placed behind your ear for the management of post- operative nausea and/or vomiting:  1. The medication in the patch is effective for 72 hours, after which it should be removed.  Wrap patch in a tissue and discard in the trash. Wash hands thoroughly with soap and water. 2. You may remove the patch earlier than 72 hours if you experience unpleasant side effects which may include dry mouth, dizziness or visual disturbances. 3. Avoid touching the patch. Wash your hands with soap and water after contact with the patch.

## 2020-10-19 NOTE — Interval H&P Note (Signed)
History and Physical Interval Note:  10/19/2020 1:40 PM  Kelly Barry  has presented today for surgery, with the diagnosis of Undesired Fertility.  The various methods of treatment have been discussed with the patient and family. After consideration of risks, benefits and other options for treatment, the patient has consented to  Procedure(s): LAPAROSCOPIC TUBAL LIGATION (Bilateral) as a surgical intervention.  The patient's history has been reviewed, patient examined, no change in status, stable for surgery.  I have reviewed the patient's chart and labs.  Questions were answered to the patient's satisfaction.   Again reviewed contraception alternatives.  Pt desires permanent sterilization.  Risk of regret discussed in detail.  No changes in history of physical exam.  Pt wishes to proceed.   Warden Fillers

## 2020-10-19 NOTE — Op Note (Signed)
Allena Napoleon PROCEDURE DATE: 10/19/2020   PREOPERATIVE DIAGNOSIS:  Undesired fertility  POSTOPERATIVE DIAGNOSIS:  Undesired fertility  PROCEDURE:  Laparoscopic Bilateral Salpingectomy   SURGEON:  Dr. Mariel Aloe  ASSISTANT:  N/a  ANESTHESIA:  General endotracheal  COMPLICATIONS:  None immediate.  ESTIMATED BLOOD LOSS:  10 ml.  FLUIDS: 800 ml LR.  URINE OUTPUT:  50 ml of clear urine.  Pt voided prior to the case  INDICATIONS: 29 y.o. G1P0010 with undesired fertility, desires permanent sterilization. Other reversible forms of contraception were discussed with patient; she declines all other modalities.  Risks of procedure discussed with patient including permanence of method, risk of regret, bleeding, infection, injury to surrounding organs and need for additional procedures including laparotomy.  Failure risk less than 0.5% with increased risk of ectopic gestation if pregnancy occurs was also discussed with patient.  Written informed consent was obtained.    FINDINGS:  Normal uterus, fallopian tubes.  Right simple ovarian cyst noted  TECHNIQUE:  The patient was taken to the operating room where general anesthesia was obtained without difficulty.  She was then placed in the dorsal lithotomy position and prepared and draped in sterile fashion.  After an adequate timeout was performed, a bivalved speculum was then placed in the patient's vagina, and the anterior lip of cervix grasped with the single-tooth tenaculum.  The [place] uterine manipulator was then advanced into the uterus and secured.  The speculum was removed from the vagina as was the tenaculum.  Attention was then turned to the patient's abdomen where a 6-mm skin incision was made in the umbilical fold.  The Optiview 5-mm trocar and sleeve were then advanced without difficulty with the laparoscope under direct visualization into the abdomen.  The abdomen was then insufflated with carbon dioxide gas.  Adequate  pneumoperitoneum was obtained.  A survey of the patient's pelvis and abdomen revealed the findings above. A right 5-mm lower quadrant port was then placed under direct visualization.  A left 10 mm port was also placed under direct visualization.  The fallopian tubes were transected from the uterine attachments and the underlying mesosalpinx with the LigaSure device allowing for bilateral salpingectomy.  The fallopian tubes were then removed from the abdomen under direct visualization.  The operative site was surveyed, and it was found to be hemostatic.   No intraoperative injury to other surrounding organs was noted.  The abdomen was desufflated and all instruments were then removed from the patient's abdomen.  The fascia at the left port was reapproximated with 0 vicryl.  The skin was closed with 4-0 Vicryl and Dermabond. The 5 mm port sites were closed with dermabond. The uterine manipulator was removed from the cervix without complications. The patient tolerated the procedure well.  Sponge, lap, and needle counts were correct times two.  The patient was then taken to the recovery room awake, extubated and in stable condition.  The patient will be discharged to home as per PACU criteria.  Routine postoperative instructions given.  She was prescribed Percocet, Ibuprofen and   She will follow up in the clinic in 2 weeks for postoperative evaluation.   Mariel Aloe, MD, FACOG Attending Obstetrician & Gynecologist Faculty Practice, [facility]

## 2020-10-19 NOTE — Anesthesia Postprocedure Evaluation (Signed)
Anesthesia Post Note  Patient: Kelly Barry  Procedure(s) Performed: LAPAROSCOPIC BILATERAL SALPINGECTOMY (Bilateral Abdomen)     Patient location during evaluation: PACU Anesthesia Type: General Level of consciousness: awake and alert Pain management: pain level controlled Vital Signs Assessment: post-procedure vital signs reviewed and stable Respiratory status: spontaneous breathing, nonlabored ventilation and respiratory function stable Cardiovascular status: blood pressure returned to baseline and stable Postop Assessment: no apparent nausea or vomiting Anesthetic complications: no   No complications documented.  Last Vitals:  Vitals:   10/19/20 1530 10/19/20 1550  BP: 117/83 131/87  Pulse: 60 66  Resp: 12 13  Temp:  36.6 C  SpO2: 99% 99%    Last Pain:  Vitals:   10/19/20 1550  TempSrc:   PainSc: 0-No pain                 Lowella Curb

## 2020-10-19 NOTE — Transfer of Care (Signed)
Immediate Anesthesia Transfer of Care Note  Patient: Kelly Barry  Procedure(s) Performed: LAPAROSCOPIC BILATERAL SALPINGECTOMY (Bilateral Abdomen)  Patient Location: PACU  Anesthesia Type:General  Level of Consciousness: awake, alert , oriented and patient cooperative  Airway & Oxygen Therapy: Patient Spontanous Breathing and Patient connected to face mask oxygen  Post-op Assessment: Report given to RN and Post -op Vital signs reviewed and stable  Post vital signs: Reviewed and stable  Last Vitals:  Vitals Value Taken Time  BP    Temp    Pulse    Resp 13 10/19/20 1505  SpO2    Vitals shown include unvalidated device data.  Last Pain:  Vitals:   10/19/20 1233  TempSrc: Oral  PainSc: 0-No pain         Complications: No complications documented.

## 2020-10-23 ENCOUNTER — Other Ambulatory Visit: Payer: Self-pay | Admitting: Family Medicine

## 2020-10-23 DIAGNOSIS — F332 Major depressive disorder, recurrent severe without psychotic features: Secondary | ICD-10-CM

## 2020-10-24 ENCOUNTER — Encounter (HOSPITAL_BASED_OUTPATIENT_CLINIC_OR_DEPARTMENT_OTHER): Payer: Self-pay | Admitting: Obstetrics and Gynecology

## 2020-10-24 LAB — SURGICAL PATHOLOGY

## 2020-11-02 ENCOUNTER — Other Ambulatory Visit: Payer: Self-pay

## 2020-11-02 ENCOUNTER — Ambulatory Visit (INDEPENDENT_AMBULATORY_CARE_PROVIDER_SITE_OTHER): Payer: Medicaid Other | Admitting: Obstetrics and Gynecology

## 2020-11-02 ENCOUNTER — Encounter: Payer: Self-pay | Admitting: Obstetrics and Gynecology

## 2020-11-02 DIAGNOSIS — Z4889 Encounter for other specified surgical aftercare: Secondary | ICD-10-CM

## 2020-11-02 DIAGNOSIS — G8918 Other acute postprocedural pain: Secondary | ICD-10-CM

## 2020-11-02 DIAGNOSIS — K432 Incisional hernia without obstruction or gangrene: Secondary | ICD-10-CM | POA: Insufficient documentation

## 2020-11-02 NOTE — Progress Notes (Signed)
Pt states she has been feeling bloated, still having some bleeding and having chills around her trunk area.

## 2020-11-02 NOTE — Progress Notes (Signed)
    Subjective:    Kelly Barry is a 30 y.o. female who presents to the clinic status post laparoscopic bilateral salpingectomy for permanent contraception on 10/19/20. The patient is not having any pain.  Eating a regular diet without difficulty. Bowel movements are normal but pt notes slightly decreased flatus.  She has had no issues with voiding and there is no nausea/vomiting. No other significant postoperative concerns.  Pt and guardian noted possible fever of 101 the week after procedure.  They did not call the office to inform the physician.  The following portions of the patient's history were reviewed and updated as appropriate: allergies, current medications, past family history, past medical history, past social history, past surgical history and problem list..  Last pap smear was negative on 06/11/20.  Review of Systems Pertinent items are noted in HPI.   Objective:   BP 111/77   Pulse 82   Wt 203 lb (92.1 kg)   LMP 10/10/2020   BMI 41.00 kg/m  Constitutional:  Well-developed, well-nourished female in no acute distress.   Skin: Skin is warm and dry, no rash noted, not diaphoretic,no erythema, no pallor.  Cardiovascular: Normal heart rate noted  Respiratory: Effort and breath sounds normal, no problems with respiration noted  Abdomen: Soft, bowel sounds active, non-tender, no abnormal masses  Incision: Healing well, no drainage, no erythema, no hernia, no seroma, no swelling, no dehiscence, incision well approximated  Pelvic:   Deferred   Surgical pathology (bilateral fallopian tubes)  Assessment:   Doing well postoperatively.  Operative findings again reviewed. Pathology report discussed.   Plan:   1. Continue any current medications. 2. Wound care discussed. 3. Activity restrictions: none 4. Anticipated return to work: now. 5. Follow up as needed 6.  Routine preventative health maintenance measures emphasized. Please refer to After Visit Summary for other  counseling recommendations.  7. Check CBC for follow up due to report of fever after procedure.  Lynnda Shields, MD, [place] Attending [place] for [facility], [place]

## 2020-11-03 LAB — CBC
Hematocrit: 44.3 % (ref 34.0–46.6)
Hemoglobin: 14.8 g/dL (ref 11.1–15.9)
MCH: 29.4 pg (ref 26.6–33.0)
MCHC: 33.4 g/dL (ref 31.5–35.7)
MCV: 88 fL (ref 79–97)
Platelets: 354 10*3/uL (ref 150–450)
RBC: 5.04 x10E6/uL (ref 3.77–5.28)
RDW: 11.9 % (ref 11.7–15.4)
WBC: 10.9 10*3/uL — ABNORMAL HIGH (ref 3.4–10.8)

## 2020-11-24 IMAGING — DX DG ABDOMEN ACUTE W/ 1V CHEST
3 series · 3 of 3 positions shown · non-contrast
Comparison: Abdominopelvic CT 02/10/2018, chest radiograph
05/08/2019

CLINICAL DATA: Abdominal pain and distension.

EXAM:
DG ABDOMEN ACUTE W/ 1V CHEST

[chest pa]
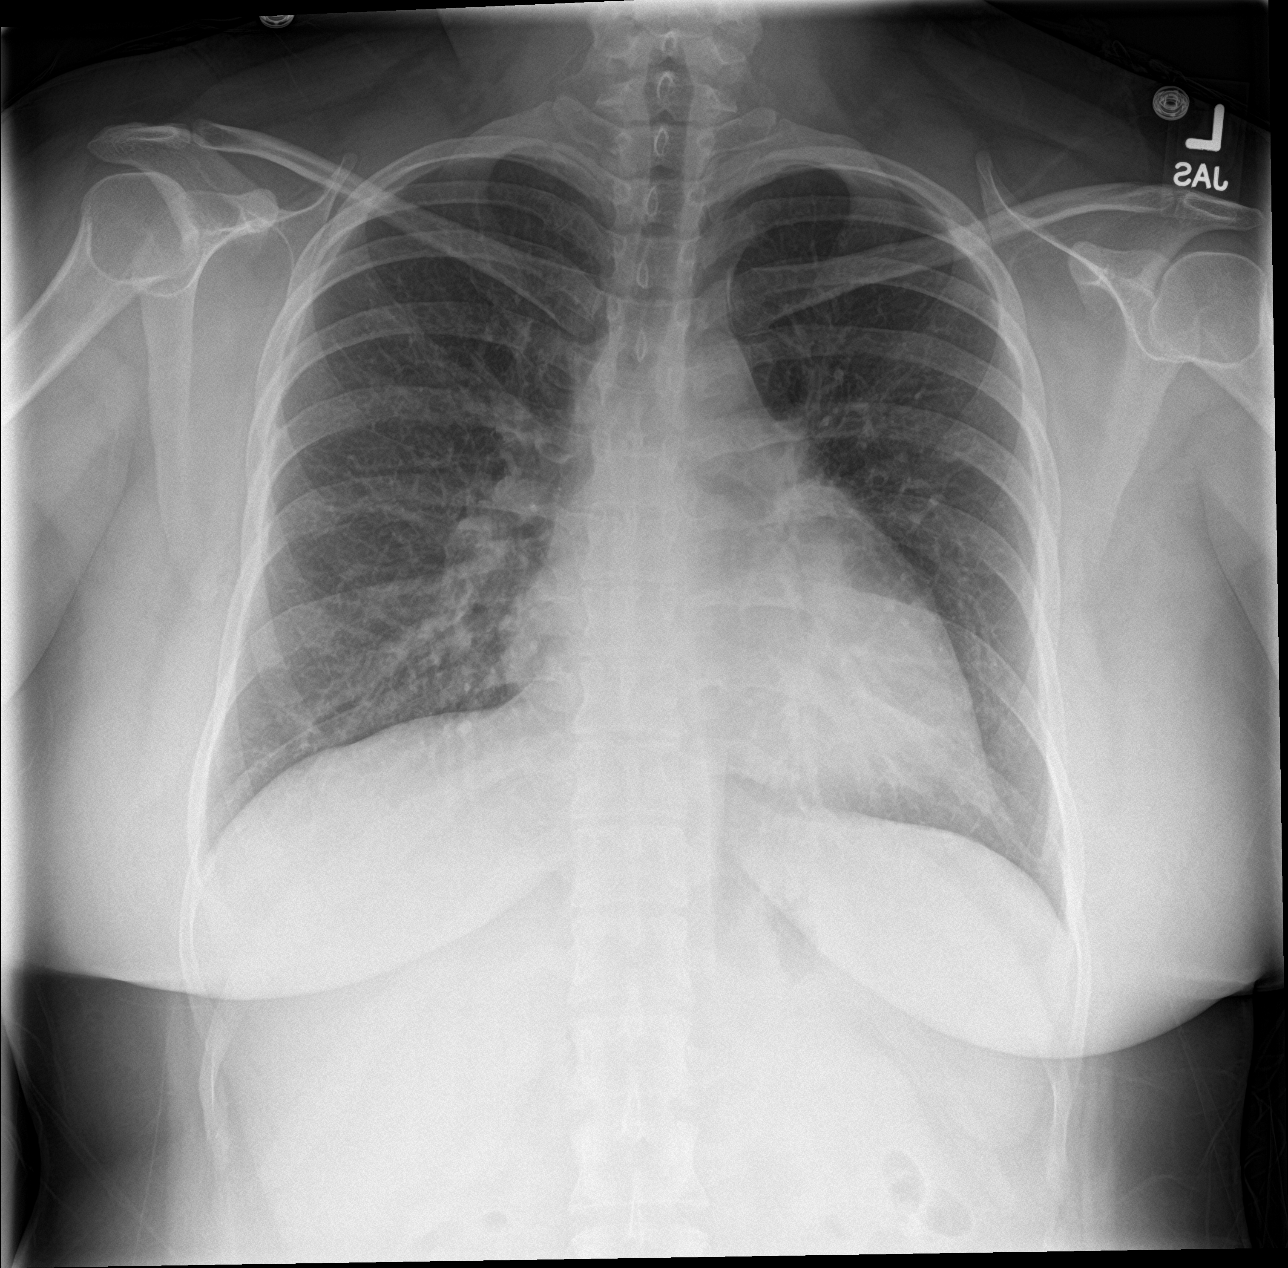

[abdomen erect]
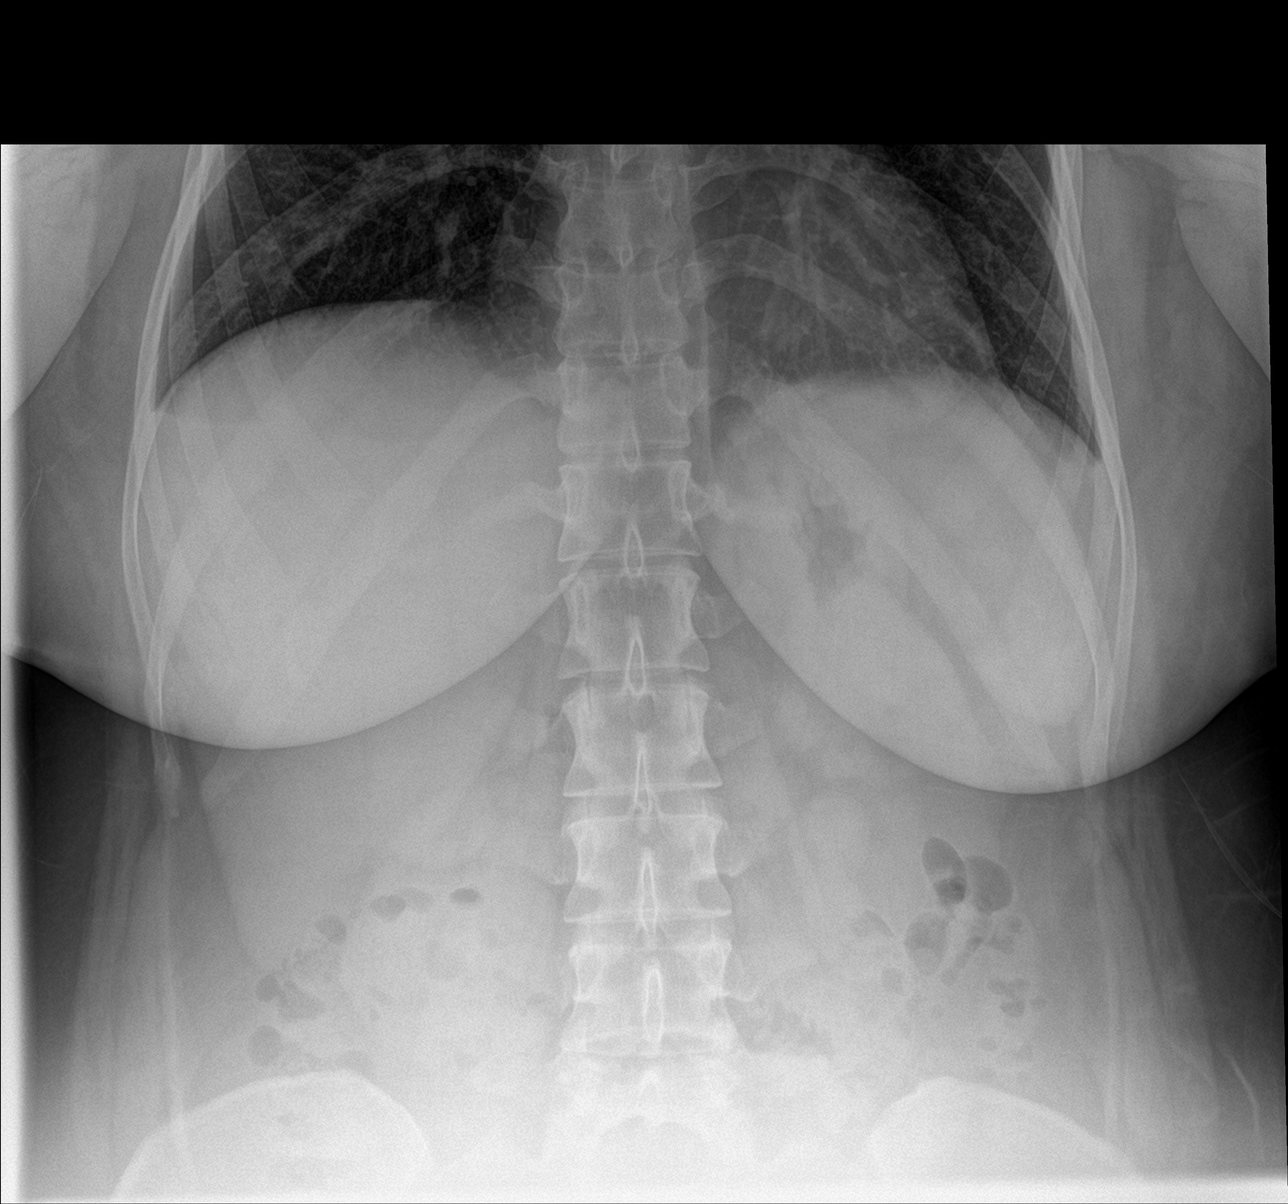

[abdomen supine]
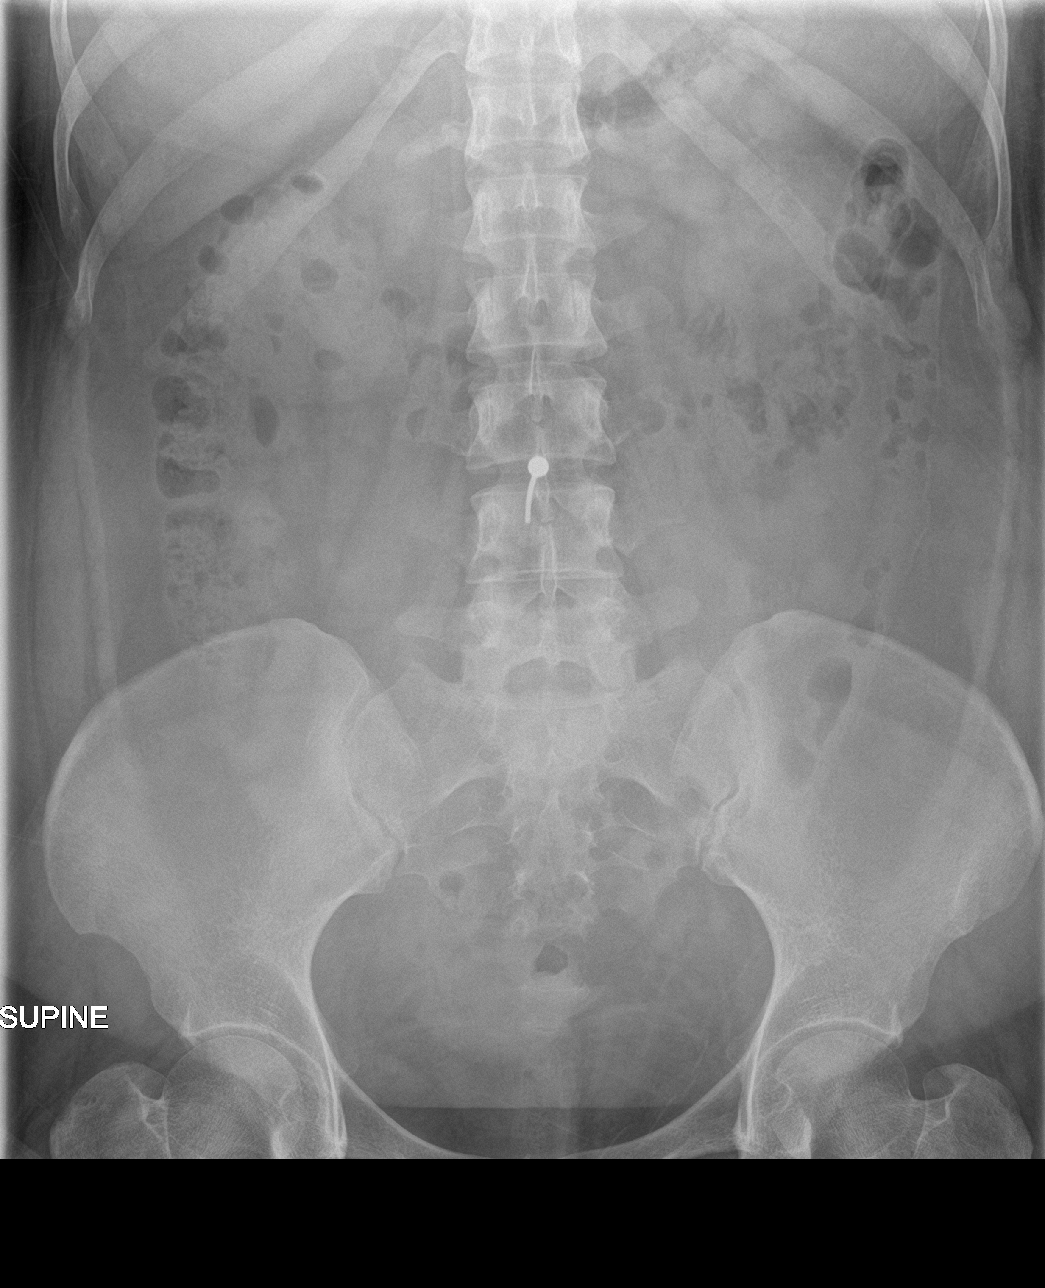

[3 of 3 positions shown; findings below may reference images not displayed]

FINDINGS: Stable borderline cardiomegaly. The cardiomediastinal contours are
unchanged. The lungs are clear. There is no free intra-abdominal
air. No dilated bowel loops to suggest obstruction. Small volume of
stool throughout the colon. No radiopaque calculi. No acute osseous
abnormalities are seen.
IMPRESSION: 1. No acute chest finding.  Stable upper normal heart size.
2. Normal bowel gas pattern. No free air.

## 2020-12-29 ENCOUNTER — Ambulatory Visit (HOSPITAL_COMMUNITY)
Admission: EM | Admit: 2020-12-29 | Discharge: 2020-12-29 | Disposition: A | Discharge status: Home / Self Care | Payer: Medicaid Other | Attending: Urgent Care | Admitting: Urgent Care

## 2020-12-29 ENCOUNTER — Encounter (HOSPITAL_COMMUNITY): Payer: Self-pay

## 2020-12-29 ENCOUNTER — Other Ambulatory Visit: Payer: Self-pay

## 2020-12-29 DIAGNOSIS — R1084 Generalized abdominal pain: Secondary | ICD-10-CM | POA: Diagnosis not present

## 2020-12-29 DIAGNOSIS — R42 Dizziness and giddiness: Secondary | ICD-10-CM | POA: Diagnosis not present

## 2020-12-29 DIAGNOSIS — R112 Nausea with vomiting, unspecified: Secondary | ICD-10-CM | POA: Diagnosis present

## 2020-12-29 DIAGNOSIS — R197 Diarrhea, unspecified: Secondary | ICD-10-CM | POA: Insufficient documentation

## 2020-12-29 LAB — POCT URINALYSIS DIPSTICK, ED / UC
Bilirubin Urine: NEGATIVE
Glucose, UA: NEGATIVE mg/dL
Hgb urine dipstick: NEGATIVE
Ketones, ur: NEGATIVE mg/dL
Leukocytes,Ua: NEGATIVE
Nitrite: NEGATIVE
Protein, ur: NEGATIVE mg/dL
Specific Gravity, Urine: 1.02 (ref 1.005–1.030)
Urobilinogen, UA: 0.2 mg/dL (ref 0.0–1.0)
pH: 8.5 — ABNORMAL HIGH (ref 5.0–8.0)

## 2020-12-29 MED ORDER — LOPERAMIDE HCL 2 MG PO CAPS: 2.0000 mg | ORAL_CAPSULE | Freq: Two times a day (BID) | ORAL | 0 refills | Status: DC | PRN

## 2020-12-29 MED ORDER — ONDANSETRON 8 MG PO TBDP: 8.0000 mg | ORAL_TABLET | Freq: Three times a day (TID) | ORAL | 0 refills | Status: DC | PRN

## 2020-12-29 NOTE — Discharge Instructions (Signed)

## 2020-12-29 NOTE — ED Notes (Addendum)
Patient states she did collect a vaginal swab.  Specimen is in lab

## 2020-12-29 NOTE — ED Provider Notes (Signed)
[place]   MRN: [id number] DOB: Nov 21, 1990  Subjective:   Kelly Barry is a 30 y.o. female presenting for 2 week history of persistent dizziness, nausea, vomiting and diarrhea. Has also started to have burning type sensation of lower abdomen, urinary frequency. Went to her PCP, was thought to have an UTI and started an antibiotic for this, her symptoms started shortly thereafter. Has finished the antibiotic. No bloody stools. She had a bilateral salpingectomy at the end of 2021 and has had surgical follow up which was unremarkable. No fever, cough, chest pain, shob, dysuria, hematuria, vaginal discharge. Has had COVID vaccination but not the booster. States that she just had a COVID test in the past 2 weeks and was negative. She would like to make sure that she has STI testing as well.   No current facility-administered medications for this encounter.  Current Outpatient Medications:  .  albuterol (VENTOLIN HFA) 108 (90 Base) MCG/ACT inhaler, Inhale 2 puffs into the lungs every 6 (six) hours as needed for wheezing or shortness of breath., Disp: 18 g, Rfl: 2 .  ARIPiprazole (ABILIFY) 15 MG tablet, Take 15 mg by mouth daily., Disp: , Rfl:  .  clonazePAM (KLONOPIN) 0.5 MG tablet, Take 1 tablet (0.5 mg total) by mouth 3 (three) times daily as needed., Disp: 90 tablet, Rfl: 2 .  ibuprofen (ADVIL) 600 MG tablet, Take 1 tablet (600 mg total) by mouth every 6 (six) hours as needed for headache, mild pain, moderate pain or cramping., Disp: 30 tablet, Rfl: 2 .  Melatonin 1 MG CAPS, Take by mouth., Disp: , Rfl:  .  prazosin (MINIPRESS) 2 MG capsule, Take 2 capsules (4 mg total) by mouth at bedtime. (Patient taking differently: Take 4 mg by mouth at bedtime.), Disp: 180 capsule, Rfl: 1 .  topiramate (TOPAMAX) 50 MG tablet, Take 1 tablet (50 mg total) by mouth 2 (two) times daily. Needs to be seen before next refill, Disp: 60 tablet, Rfl: 0 .  traZODone (DESYREL) 50 MG tablet, Take 1  tablet (50 mg total) by mouth at bedtime. (Needs to be seen before next refill), Disp: 30 tablet, Rfl: 0   Allergies  Allergen Reactions  . Diclofenac Anaphylaxis, Swelling and Rash  . Hydroxyzine Other (See Comments)    Headaches....fevers  . Lactose Intolerance (Gi) Diarrhea and Nausea And Vomiting  . Icy Hot Rash    Past Medical History:  Diagnosis Date  . Adopted   . Anxiety   . Asthma   . Bipolar disorder ([place])   . Borderline personality disorder ([place])   . Depression   . GERD (gastroesophageal reflux disease)   . Headache    Migraines  . Mixed hyperlipidemia   . PTSD (post-traumatic stress disorder)   . Suicide attempt [facility])      Past Surgical History:  Procedure Laterality Date  . DILATION AND EVACUATION N/A 01/02/2018   Procedure: ULTRASOUND GUIDED DILATATION AND EVACUATION;  Surgeon: Servando Salina, MD;  Location: [facility];  Service: Gynecology;  Laterality: N/A;  . ESOPHAGOGASTRODUODENOSCOPY  06/25/2012   Procedure: ESOPHAGOGASTRODUODENOSCOPY (EGD);  Surgeon: Arta Silence, MD;  Location: [facility];  Service: Endoscopy;  Laterality: N/A;  [name]/ebp  . GANGLION CYST EXCISION    . HYSTEROSCOPY N/A 10/20/2015   Procedure: HYSTEROSCOPY;  Surgeon: Janyth Pupa, DO;  Location: [facility];  Service: Gynecology;  Laterality: N/A;  . IUD REMOVAL N/A 10/20/2015   Procedure: INTRAUTERINE DEVICE (IUD) REMOVAL;  Surgeon: Janyth Pupa, DO;  Location:  [facility];  Service: Gynecology;  Laterality: N/A;  . LAPAROSCOPIC BILATERAL SALPINGECTOMY Bilateral 10/19/2020   Procedure: LAPAROSCOPIC BILATERAL SALPINGECTOMY;  Surgeon: Griffin Basil, MD;  Location: [facility];  Service: Gynecology;  Laterality: Bilateral;    Family History  Adopted: Yes    Social History   Tobacco Use  . Smoking status: Former Smoker    Packs/day: 0.10    Years: 0.20    Pack years: 0.02    Types: Cigarettes  . Smokeless tobacco: Never Used  Vaping Use  . Vaping Use: Never used   Substance Use Topics  . Alcohol use: No  . Drug use: No    ROS   Objective:   Vitals: BP 118/79   Pulse 100   Temp 98.7 F (37.1 C) (Oral)   Resp 17   SpO2 100%   Physical Exam Constitutional:      General: She is not in acute distress.    Appearance: Normal appearance. She is well-developed. She is not ill-appearing, toxic-appearing or diaphoretic.  HENT:     Head: Normocephalic and atraumatic.     Nose: Nose normal.     Mouth/Throat:     Mouth: Mucous membranes are moist.  Eyes:     Extraocular Movements: Extraocular movements intact.     Pupils: Pupils are equal, round, and reactive to light.  Cardiovascular:     Rate and Rhythm: Normal rate and regular rhythm.     Pulses: Normal pulses.     Heart sounds: Normal heart sounds. No murmur heard. No friction rub. No gallop.   Pulmonary:     Effort: Pulmonary effort is normal. No respiratory distress.     Breath sounds: Normal breath sounds. No stridor. No wheezing, rhonchi or rales.  Abdominal:     General: Bowel sounds are normal. There is no distension.     Palpations: Abdomen is soft. There is no mass.     Tenderness: There is abdominal tenderness (generalized). There is no right CVA tenderness, left CVA tenderness, guarding or rebound.  Skin:    General: Skin is warm and dry.     Findings: No rash.  Neurological:     Mental Status: She is alert and oriented to person, place, and time.  Psychiatric:        Mood and Affect: Mood normal.        Behavior: Behavior normal.        Thought Content: Thought content normal.        Judgment: Judgment normal.     Results for orders placed or performed during the hospital encounter of 12/29/20 (from the past 24 hour(s))  POC Urinalysis dipstick     Status: Abnormal   Collection Time: 12/29/20 10:56 AM  Result Value Ref Range   Glucose, UA NEGATIVE NEGATIVE mg/dL   Bilirubin Urine NEGATIVE NEGATIVE   Ketones, ur NEGATIVE NEGATIVE mg/dL   Specific Gravity, Urine  1.020 1.005 - 1.030   Hgb urine dipstick NEGATIVE NEGATIVE   pH 8.5 (H) 5.0 - 8.0   Protein, ur NEGATIVE NEGATIVE mg/dL   Urobilinogen, UA 0.2 0.0 - 1.0 mg/dL   Nitrite NEGATIVE NEGATIVE   Leukocytes,Ua NEGATIVE NEGATIVE    Assessment and Plan :   PDMP not reviewed this encounter.  1. Generalized abdominal pain   2. Nausea vomiting and diarrhea   3. Dizziness     STI testing pending. Suspect GI symptoms related to recent antibiotic use. GI panel pending. Recommended supportive care, fluids and  light meals. Use Zofran and loperamide prn. Patient declined COVID testing again. Follow up closely with PCP. Counseled patient on potential for adverse effects with medications prescribed/recommended today, ER and return-to-clinic precautions discussed, patient verbalized understanding.    Jaynee Eagles, PA-C 12/29/20 1143

## 2020-12-29 NOTE — ED Triage Notes (Signed)
Pt c/o vomiting, stomach pain, weakness, dizziness X 2 weeks. Pt states she has a a lot of gas and has been urinating a lot.  Pt states she had surgery and states she has been having theses sxs after post-op follow up.

## 2020-12-29 NOTE — ED Notes (Signed)
Patient provided equipment to gather stool specimen at home, answered questions.

## 2020-12-30 ENCOUNTER — Telehealth (HOSPITAL_COMMUNITY): Payer: Self-pay | Admitting: Emergency Medicine

## 2020-12-30 LAB — CERVICOVAGINAL ANCILLARY ONLY
Bacterial Vaginitis (gardnerella): POSITIVE — AB
Candida Glabrata: NEGATIVE
Candida Vaginitis: NEGATIVE
Chlamydia: NEGATIVE
Comment: NEGATIVE
Comment: NEGATIVE
Comment: NEGATIVE
Comment: NEGATIVE
Comment: NEGATIVE
Comment: NORMAL
Neisseria Gonorrhea: NEGATIVE
Trichomonas: NEGATIVE

## 2020-12-30 LAB — GASTROINTESTINAL PANEL BY PCR, STOOL (REPLACES STOOL CULTURE)

## 2020-12-30 MED ORDER — METRONIDAZOLE 500 MG PO TABS
500.0000 mg | ORAL_TABLET | Freq: Two times a day (BID) | ORAL | 0 refills | Status: DC
Start: 2020-12-30 — End: 2021-06-08

## 2020-12-30 NOTE — Telephone Encounter (Signed)
Patient was made aware of test result, treatment regimen was reviewed with patient.

## 2021-06-08 ENCOUNTER — Telehealth: Payer: Medicaid Other | Admitting: Physician Assistant

## 2021-06-08 DIAGNOSIS — R195 Other fecal abnormalities: Secondary | ICD-10-CM | POA: Diagnosis not present

## 2021-06-08 DIAGNOSIS — R5382 Chronic fatigue, unspecified: Secondary | ICD-10-CM | POA: Diagnosis not present

## 2021-06-08 DIAGNOSIS — R8290 Unspecified abnormal findings in urine: Secondary | ICD-10-CM

## 2021-06-08 DIAGNOSIS — R634 Abnormal weight loss: Secondary | ICD-10-CM | POA: Diagnosis not present

## 2021-06-08 NOTE — Progress Notes (Signed)
Virtual Visit Consent   Kelly Barry, you are scheduled for a virtual visit with a [facility] provider today.     Just as with appointments in the office, your consent must be obtained to participate.  Your consent will be active for this visit and any virtual visit you may have with one of our providers in the next 365 days.     If you have a MyChart account, a copy of this consent can be sent to you electronically.  All virtual visits are billed to your insurance company just like a traditional visit in the office.    As this is a virtual visit, video technology does not allow for your provider to perform a traditional examination.  This may limit your provider's ability to fully assess your condition.  If your provider identifies any concerns that need to be evaluated in person or the need to arrange testing (such as labs, EKG, etc.), we will make arrangements to do so.     Although advances in technology are sophisticated, we cannot ensure that it will always work on either your end or our end.  If the connection with a video visit is poor, the visit may have to be switched to a telephone visit.  With either a video or telephone visit, we are not always able to ensure that we have a secure connection.     I need to obtain your verbal consent now.   Are you willing to proceed with your visit today?    Kelly Barry has provided verbal consent on 06/08/2021 for a virtual visit (video or telephone).   Leeanne Rio, [place]   Date: 06/08/2021 4:17 PM   Virtual Visit via Video Note   I, Leeanne Rio, connected with  Kelly Barry  ([id number], February 03, 1991) on 06/08/21 at  4:00 PM EDT by a video-enabled telemedicine application and verified that I am speaking with the correct person using two identifiers.  Location: Patient: Virtual Visit Location Patient: Home Provider: Virtual Visit Location Provider: Home Office   I discussed the limitations of evaluation and  management by telemedicine and the availability of in person appointments. The patient expressed understanding and agreed to proceed.    History of Present Illness: Kelly Barry is a 30 y.o. who identifies as a female who was assigned female at birth, and is being seen today for multiple complaints. Endorses over the past 3 weeks, noting some chunks of stuff coming out of her vagina and also sometimes in both urine and stool. Also noting unexplainable weight loss. Endorses being 213 -- down to 188 within the past several weeks.  Noting substantial fatigue -- sleeping at night but then napping in the day as well.  Notes waking up and feeling rested but within a couple of hours is feeling very fatigued. Notes when she eats this improves slightly but then deteriorates again quickly. Notes some occasional shortness of breath with exertion. Denies fevers, chills, aches. Denies chest pain. Notes some cold intolerance.  Notes soft but formed stools. Denies any melena, hematochezia or tenesmus. One episode of BRBPR when wiping, resolved. Denies any noted hematuria but occasionally urine will be slightly darker.   HPI: HPI  Problems:  Patient Active Problem List   Diagnosis Date Noted   Postoperative abdominal hernia 11/02/2020   Postoperative pain 11/02/2020   Postoperative visit 11/02/2020   Encounter for female sterilization procedure    Encounter for preoperative assessment 10/13/2020   Encounter for  consultation for female sterilization 08/10/2020   Mixed hyperlipidemia    Prediabetes 05/08/2019   COVID-19 04/29/2019   Asthma 04/29/2019   Transaminitis 04/29/2019   GAD (generalized anxiety disorder) 03/10/2019   Depression, major, single episode, moderate ([place]) 03/10/2019   History of miscarriage 02/21/2018   Mood disorder ([place]) 07/04/2017   PTSD (post-traumatic stress disorder) 01/14/2017   Suicidal ideations 03/28/2016   MDD (major depressive disorder), recurrent episode, severe ([place])  03/27/2016    Allergies:  Allergies  Allergen Reactions   Diclofenac Anaphylaxis, Swelling and Rash   Hydroxyzine Other (See Comments)    Headaches....fevers   Lactose Intolerance (Gi) Diarrhea and Nausea And Vomiting   Icy Hot Rash   Medications:  Current Outpatient Medications:    albuterol (VENTOLIN HFA) 108 (90 Base) MCG/ACT inhaler, Inhale 2 puffs into the lungs every 6 (six) hours as needed for wheezing or shortness of breath., Disp: 18 g, Rfl: 2   clonazePAM (KLONOPIN) 0.5 MG tablet, Take 1 tablet (0.5 mg total) by mouth 3 (three) times daily as needed., Disp: 90 tablet, Rfl: 2   Melatonin 1 MG CAPS, Take by mouth., Disp: , Rfl:    prazosin (MINIPRESS) 2 MG capsule, Take 2 capsules (4 mg total) by mouth at bedtime. (Patient taking differently: Take 4 mg by mouth at bedtime.), Disp: 180 capsule, Rfl: 1   sertraline (ZOLOFT) 50 MG tablet, Take 50 mg by mouth daily., Disp: , Rfl:    topiramate (TOPAMAX) 50 MG tablet, Take 1 tablet (50 mg total) by mouth 2 (two) times daily. Needs to be seen before next refill, Disp: 60 tablet, Rfl: 0  Observations/Objective: Patient is well-developed, well-nourished in no acute distress.  Resting comfortably at home.  Head is normocephalic, atraumatic.  No labored breathing. Speech is clear and coherent with logical content.  Patient is alert and oriented at baseline.   Assessment and Plan: 1. Weight loss  2. Chronic fatigue  3. Abnormal urine  4. Abnormal findings in stool Patient is able to show me the "clumps" coming out when she uses the restroom. They are small and brown in coloration, almost with a cast-like appearance. Non motile. No clots noted. Giving weight loss, fatigue, windedness along with these symptoms, she needs an in-depth evaluation, above which can be given through this platform. She does have an active PCP. Recommend she contact them to get appointment for tomorrow. If unable to be seen, want her to go directly to Urgent  Care for an evaluation.   Follow Up Instructions: I discussed the assessment and treatment plan with the patient. The patient was provided an opportunity to ask questions and all were answered. The patient agreed with the plan and demonstrated an understanding of the instructions.  A copy of instructions were sent to the patient via MyChart.  The patient was advised to call back or seek an in-person evaluation if the symptoms worsen or if the condition fails to improve as anticipated.  Time:  I spent 15 minutes with the patient via telehealth technology discussing the above problems/concerns.    Leeanne Rio, PA-C

## 2021-06-08 NOTE — Patient Instructions (Signed)
Kelly Barry, thank you for joining Leeanne Rio, PA-C for today's virtual visit.  While this provider is not your primary care provider (PCP), if your PCP is located in our provider database this encounter information will be shared with them immediately following your visit.  Consent: (Patient) Kelly Barry provided verbal consent for this virtual visit at the beginning of the encounter.  Current Medications:  Current Outpatient Medications:    albuterol (VENTOLIN HFA) 108 (90 Base) MCG/ACT inhaler, Inhale 2 puffs into the lungs every 6 (six) hours as needed for wheezing or shortness of breath., Disp: 18 g, Rfl: 2   ARIPiprazole (ABILIFY) 15 MG tablet, Take 15 mg by mouth daily., Disp: , Rfl:    clonazePAM (KLONOPIN) 0.5 MG tablet, Take 1 tablet (0.5 mg total) by mouth 3 (three) times daily as needed., Disp: 90 tablet, Rfl: 2   ibuprofen (ADVIL) 600 MG tablet, Take 1 tablet (600 mg total) by mouth every 6 (six) hours as needed for headache, mild pain, moderate pain or cramping., Disp: 30 tablet, Rfl: 2   loperamide (IMODIUM) 2 MG capsule, Take 1 capsule (2 mg total) by mouth 2 (two) times daily as needed for diarrhea or loose stools., Disp: 14 capsule, Rfl: 0   Melatonin 1 MG CAPS, Take by mouth., Disp: , Rfl:    metroNIDAZOLE (FLAGYL) 500 MG tablet, Take 1 tablet (500 mg total) by mouth 2 (two) times daily., Disp: 14 tablet, Rfl: 0   ondansetron (ZOFRAN-ODT) 8 MG disintegrating tablet, Take 1 tablet (8 mg total) by mouth every 8 (eight) hours as needed for nausea or vomiting., Disp: 20 tablet, Rfl: 0   prazosin (MINIPRESS) 2 MG capsule, Take 2 capsules (4 mg total) by mouth at bedtime. (Patient taking differently: Take 4 mg by mouth at bedtime.), Disp: 180 capsule, Rfl: 1   topiramate (TOPAMAX) 50 MG tablet, Take 1 tablet (50 mg total) by mouth 2 (two) times daily. Needs to be seen before next refill, Disp: 60 tablet, Rfl: 0   traZODone (DESYREL) 50 MG tablet, Take 1 tablet (50  mg total) by mouth at bedtime. (Needs to be seen before next refill), Disp: 30 tablet, Rfl: 0   Medications ordered in this encounter:  No orders of the defined types were placed in this encounter.    *If you need refills on other medications prior to your next appointment, please contact your pharmacy*  Follow-Up: Call back or seek an in-person evaluation if the symptoms worsen or if the condition fails to improve as anticipated.  Other Instructions Please contact your PCP as discussed to get an appointment before the end of the week. If they are unable to see you, I want you to use the link below to get scheduled at one of our in-person urgent cares for you to be seen at. Do not delay care!   If you have been instructed to have an in-person evaluation today at a local Urgent Care facility, please use the link below. It will take you to a list of all of our available [facility], including address, phone number and hours of operation. Please do not delay care.  [facility]  If you or a family member do not have a primary care provider, use the link below to schedule a visit and establish care. When you choose a [facility] primary care physician or advanced practice provider, you gain a long-term partner in health. Find a Primary Care Provider  Learn  more about [facility]'s in-office and virtual care options: [facility]

## 2021-08-23 ENCOUNTER — Telehealth: Payer: Medicaid Other | Admitting: Physician Assistant

## 2021-08-23 DIAGNOSIS — B3731 Acute candidiasis of vulva and vagina: Secondary | ICD-10-CM | POA: Diagnosis not present

## 2021-08-23 MED ORDER — FLUCONAZOLE 150 MG PO TABS: ORAL_TABLET | ORAL | 0 refills | Status: DC

## 2021-08-23 NOTE — Progress Notes (Signed)
Virtual Visit Consent   Kelly Barry, you are scheduled for a virtual visit with a [facility] provider today.     Just as with appointments in the office, your consent must be obtained to participate.  Your consent will be active for this visit and any virtual visit you may have with one of our providers in the next 365 days.     If you have a MyChart account, a copy of this consent can be sent to you electronically.  All virtual visits are billed to your insurance company just like a traditional visit in the office.    As this is a virtual visit, video technology does not allow for your provider to perform a traditional examination.  This may limit your provider's ability to fully assess your condition.  If your provider identifies any concerns that need to be evaluated in person or the need to arrange testing (such as labs, EKG, etc.), we will make arrangements to do so.     Although advances in technology are sophisticated, we cannot ensure that it will always work on either your end or our end.  If the connection with a video visit is poor, the visit may have to be switched to a telephone visit.  With either a video or telephone visit, we are not always able to ensure that we have a secure connection.     I need to obtain your verbal consent now.   Are you willing to proceed with your visit today?    Kelly Barry has provided verbal consent on 08/23/2021 for a virtual visit (video or telephone).   Leeanne Rio, [place]   Date: 08/23/2021 10:23 AM   Virtual Visit via Video Note   I, Leeanne Rio, PA-C, attempted to connect with Kelly Barry; MRN [id number] on 08/23/21 via [employer] to complete a video urgent care visit. The patient was unable to successfully connect to the video platform. As such, the patient was contacted by this provider via phone to complete the encounter.   Location: Patient: Virtual Visit Location Patient: Home Provider: Virtual Visit  Location Provider: Home Office   I discussed the limitations of evaluation and management by telemedicine and the availability of in person appointments. The patient expressed understanding and agreed to proceed.    History of Present Illness: Kelly Barry is a 30 y.o. who identifies as a female who was assigned female at birth, and is being seen today for possible yeast infection. Patient endorses symptoms x 2-3 days including vaginal pruritus and thick white discharge. Denies fevers, chills. Denies vaginal pain. Denies concerns of pregnancy -- s/p tubal ligation in 2021.   HPI: HPI  Problems:  Patient Active Problem List   Diagnosis Date Noted   Postoperative abdominal hernia 11/02/2020   Postoperative pain 11/02/2020   Postoperative visit 11/02/2020   Encounter for female sterilization procedure    Encounter for preoperative assessment 10/13/2020   Encounter for consultation for female sterilization 08/10/2020   Mixed hyperlipidemia    Prediabetes 05/08/2019   COVID-19 04/29/2019   Asthma 04/29/2019   Transaminitis 04/29/2019   GAD (generalized anxiety disorder) 03/10/2019   Depression, major, single episode, moderate ([employer]) 03/10/2019   History of miscarriage 02/21/2018   Mood disorder ([place]) 07/04/2017   PTSD (post-traumatic stress disorder) 01/14/2017   Suicidal ideations 03/28/2016   MDD (major depressive disorder), recurrent episode, severe ([place]) 03/27/2016    Allergies:  Allergies  Allergen Reactions   Diclofenac Anaphylaxis, Swelling  and Rash   Hydroxyzine Other (See Comments)    Headaches....fevers   Lactose Intolerance (Gi) Diarrhea and Nausea And Vomiting   Icy Hot Rash   Medications:  Current Outpatient Medications:    fluconazole (DIFLUCAN) 150 MG tablet, Take 1 tablet by mouth once. You may repeat in 3 days if needed., Disp: 2 tablet, Rfl: 0   albuterol (VENTOLIN HFA) 108 (90 Base) MCG/ACT inhaler, Inhale 2 puffs into the lungs every 6 (six) hours as needed  for wheezing or shortness of breath., Disp: 18 g, Rfl: 2   clonazePAM (KLONOPIN) 0.5 MG tablet, Take 1 tablet (0.5 mg total) by mouth 3 (three) times daily as needed., Disp: 90 tablet, Rfl: 2   Melatonin 1 MG CAPS, Take by mouth., Disp: , Rfl:    prazosin (MINIPRESS) 2 MG capsule, Take 2 capsules (4 mg total) by mouth at bedtime. (Patient taking differently: Take 4 mg by mouth at bedtime.), Disp: 180 capsule, Rfl: 1   sertraline (ZOLOFT) 50 MG tablet, Take 50 mg by mouth daily., Disp: , Rfl:    topiramate (TOPAMAX) 50 MG tablet, Take 1 tablet (50 mg total) by mouth 2 (two) times daily. Needs to be seen before next refill, Disp: 60 tablet, Rfl: 0  Observations/Objective: Patient is well-developed, well-nourished in no acute distress.  Resting comfortably at home.  Head is normocephalic, atraumatic.  No labored breathing. Speech is clear and coherent with logical content.  Patient is alert and oriented at baseline.   Assessment and Plan: 1. Yeast vaginitis - fluconazole (DIFLUCAN) 150 MG tablet; Take 1 tablet by mouth once. You may repeat in 3 days if needed.  Dispense: 2 tablet; Refill: 0 Rx Diflucan x 2 doses. Supportive measures reviewed. Discussed follow-up with GYN.  Follow Up Instructions: I discussed the assessment and treatment plan with the patient. The patient was provided an opportunity to ask questions and all were answered. The patient agreed with the plan and demonstrated an understanding of the instructions.  A copy of instructions were sent to the patient via MyChart unless otherwise noted below.   The patient was advised to call back or seek an in-person evaluation if the symptoms worsen or if the condition fails to improve as anticipated.  Time:  I spent 10 minutes with the patient via telehealth technology discussing the above problems/concerns.    Leeanne Rio, PA-C

## 2021-08-23 NOTE — Patient Instructions (Signed)
  Kelly Barry, thank you for joining Leeanne Rio, PA-C for today's virtual visit.  While this provider is not your primary care provider (PCP), if your PCP is located in our provider database this encounter information will be shared with them immediately following your visit.  Consent: (Patient) Kelly Barry provided verbal consent for this virtual visit at the beginning of the encounter.  Current Medications:  Current Outpatient Medications:    albuterol (VENTOLIN HFA) 108 (90 Base) MCG/ACT inhaler, Inhale 2 puffs into the lungs every 6 (six) hours as needed for wheezing or shortness of breath., Disp: 18 g, Rfl: 2   clonazePAM (KLONOPIN) 0.5 MG tablet, Take 1 tablet (0.5 mg total) by mouth 3 (three) times daily as needed., Disp: 90 tablet, Rfl: 2   Melatonin 1 MG CAPS, Take by mouth., Disp: , Rfl:    prazosin (MINIPRESS) 2 MG capsule, Take 2 capsules (4 mg total) by mouth at bedtime. (Patient taking differently: Take 4 mg by mouth at bedtime.), Disp: 180 capsule, Rfl: 1   sertraline (ZOLOFT) 50 MG tablet, Take 50 mg by mouth daily., Disp: , Rfl:    topiramate (TOPAMAX) 50 MG tablet, Take 1 tablet (50 mg total) by mouth 2 (two) times daily. Needs to be seen before next refill, Disp: 60 tablet, Rfl: 0   Medications ordered in this encounter:  No orders of the defined types were placed in this encounter.    *If you need refills on other medications prior to your next appointment, please contact your pharmacy*  Follow-Up: Call back or seek an in-person evaluation if the symptoms worsen or if the condition fails to improve as anticipated.  Other Instructions Take the Diflucan as directed. Keep hydrated. Start a daily probiotic for women's health. Make sure to schedule follow-up with your GYN as discussed.    If you have been instructed to have an in-person evaluation today at a local Urgent Care facility, please use the link below. It will take you to a list of all of our  available [facility], including address, phone number and hours of operation. Please do not delay care.  [facility]  If you or a family member do not have a primary care provider, use the link below to schedule a visit and establish care. When you choose a [facility] primary care physician or advanced practice provider, you gain a long-term partner in health. Find a Primary Care Provider  Learn more about [facility] in-office and virtual care options: [facility]

## 2022-03-23 ENCOUNTER — Telehealth: Payer: Medicaid Other | Admitting: Physician Assistant

## 2022-03-23 DIAGNOSIS — R233 Spontaneous ecchymoses: Secondary | ICD-10-CM

## 2022-03-23 DIAGNOSIS — T3695XA Adverse effect of unspecified systemic antibiotic, initial encounter: Secondary | ICD-10-CM

## 2022-03-23 DIAGNOSIS — B379 Candidiasis, unspecified: Secondary | ICD-10-CM

## 2022-03-23 DIAGNOSIS — J029 Acute pharyngitis, unspecified: Secondary | ICD-10-CM

## 2022-03-23 DIAGNOSIS — N61 Mastitis without abscess: Secondary | ICD-10-CM

## 2022-03-23 MED ORDER — ONDANSETRON HCL 4 MG PO TABS: 4.0000 mg | ORAL_TABLET | Freq: Three times a day (TID) | ORAL | 0 refills | Status: AC | PRN

## 2022-03-23 MED ORDER — AMOXICILLIN-POT CLAVULANATE 875-125 MG PO TABS: 1.0000 | ORAL_TABLET | Freq: Two times a day (BID) | ORAL | 0 refills | Status: AC

## 2022-03-23 MED ORDER — FLUCONAZOLE 150 MG PO TABS: 150.0000 mg | ORAL_TABLET | ORAL | 0 refills | Status: AC | PRN

## 2022-03-23 NOTE — Patient Instructions (Signed)
Kelly Barry, thank you for joining Mar Daring, PA-C for today's virtual visit.  While this provider is not your primary care provider (PCP), if your PCP is located in our provider database this encounter information will be shared with them immediately following your visit.  Consent: (Patient) Kelly Barry provided verbal consent for this virtual visit at the beginning of the encounter.  Current Medications:  Current Outpatient Medications:    amoxicillin-clavulanate (AUGMENTIN) 875-125 MG tablet, Take 1 tablet by mouth 2 (two) times daily., Disp: 14 tablet, Rfl: 0   fluconazole (DIFLUCAN) 150 MG tablet, Take 1 tablet (150 mg total) by mouth every 3 (three) days as needed., Disp: 2 tablet, Rfl: 0   ondansetron (ZOFRAN) 4 MG tablet, Take 1 tablet (4 mg total) by mouth every 8 (eight) hours as needed for nausea or vomiting., Disp: 20 tablet, Rfl: 0   albuterol (VENTOLIN HFA) 108 (90 Base) MCG/ACT inhaler, Inhale 2 puffs into the lungs every 6 (six) hours as needed for wheezing or shortness of breath., Disp: 18 g, Rfl: 2   clonazePAM (KLONOPIN) 0.5 MG tablet, Take 1 tablet (0.5 mg total) by mouth 3 (three) times daily as needed., Disp: 90 tablet, Rfl: 2   Melatonin 1 MG CAPS, Take by mouth., Disp: , Rfl:    prazosin (MINIPRESS) 2 MG capsule, Take 2 capsules (4 mg total) by mouth at bedtime. (Patient taking differently: Take 4 mg by mouth at bedtime.), Disp: 180 capsule, Rfl: 1   sertraline (ZOLOFT) 50 MG tablet, Take 50 mg by mouth daily., Disp: , Rfl:    topiramate (TOPAMAX) 50 MG tablet, Take 1 tablet (50 mg total) by mouth 2 (two) times daily. Needs to be seen before next refill, Disp: 60 tablet, Rfl: 0   Medications ordered in this encounter:  Meds ordered this encounter  Medications   amoxicillin-clavulanate (AUGMENTIN) 875-125 MG tablet    Sig: Take 1 tablet by mouth 2 (two) times daily.    Dispense:  14 tablet    Refill:  0    Order Specific Question:   Supervising  Provider    Answer:   MILLER, BRIAN [[id number]]   fluconazole (DIFLUCAN) 150 MG tablet    Sig: Take 1 tablet (150 mg total) by mouth every 3 (three) days as needed.    Dispense:  2 tablet    Refill:  0    Order Specific Question:   Supervising Provider    Answer:   MILLER, BRIAN [[id number]]   ondansetron (ZOFRAN) 4 MG tablet    Sig: Take 1 tablet (4 mg total) by mouth every 8 (eight) hours as needed for nausea or vomiting.    Dispense:  20 tablet    Refill:  0    Order Specific Question:   Supervising Provider    Answer:   Sabra Heck, BRIAN [[id number]]     *If you need refills on other medications prior to your next appointment, please contact your pharmacy*  Follow-Up: Call back or seek an in-person evaluation if the symptoms worsen or if the condition fails to improve as anticipated.  Other Instructions Mastitis  Mastitis is irritation and swelling (inflammation) in an area of the breast. This most often happens in women who are breastfeeding, but it can happen to other women too, as well as some men. A doctor will help decide if treatment is needed. What are the causes? This condition is caused by: Germs (bacteria). This can happen when germs enter the breast through cuts or  openings in the skin. A plugged milk duct. This happens when something blocks the flow of milk in the breast. Nipple piercing. The pierced area can allow germs to enter the breast. Some types of breast cancer. What are the signs or symptoms? Swelling, redness, and pain in the breast. Swelling of the glands under the arm. Fluid flowing from the nipple. Feeling very tired. Headache and body aches. Fever and chills. Vomiting or feeling like you may vomit. Fast heart rate. Symptoms often last 2-5 days. The pain and redness can be the worst on days 2 and 3. This will often go away by day 5. If the infection is not treated, pus or a pocket of fluid may form under the skin (abscess). How is this diagnosed? This condition can  usually be diagnosed based on a physical exam and your symptoms. You may also have other tests, such as: Blood tests to check if your body is fighting infection. X-rays or ultrasounds of the breast. Fluid tests. If a pocket of fluid has formed, the fluid may be taken out with a needle. The fluid may be checked to see whether germs are present. Breast milk testing. A sample of breast milk may be tested for germs. This is done only when breastfeeding or pumping. How is this treated? Mastitis will sometimes go away on its own, so your doctor may choose to wait 24 hours after first seeing you to decide if medicine is needed. This condition may be treated with: Continuing to breastfeed or pump from both breasts to allow milk flow and prevent a pocket of fluid from forming. Using hot or cold compresses. Taking medicine for pain. Taking antibiotic medicine. Rest. Drinking plenty of fluids. Taking out fluid with a needle, if a pocket of fluid has formed. Follow these instructions at home: Breast care  Keep your nipples clean and dry. If told, put heat on the affected area of your breast. Use the heat source that your doctor or lactation specialist tells you to use. If told, put ice on the affected area of your breast. To do this: Put ice in a plastic bag. Place a towel between your skin and the bag. Leave the ice on for 20 minutes, 2-3 times a day. Take off the ice if your skin turns bright red. This is very important. If you cannot feel pain, heat, or cold, you have a greater risk of damage to the area. Breastfeeding and pumping tips Continue to breastfeed your baby on demand. This means feeding your baby whenever he or she is hungry. Ask your doctor or lactation specialist whether you need to change your breastfeeding routine. Avoid using nipple shields, if possible. Ask a lactation specialist for help. Change the breast you offer first at each feeding to make sure your baby feeds from both  breasts. Offer both breasts to your baby every time your baby feeds. Use gentle breast massage during feeding or pumping sessions only as told by your doctor or lactation specialist. Avoid letting your breasts get very full with milk (engorged). If your breasts are very full, you can hand express a small amount of milk for comfort. If you are pumping, keep pumping on the same schedule as you were before. In the breast with mastitis, pump until very little milk is coming out. Do not empty your breast. Emptying your breast causes your body to make more milk and can make symptoms worse. Ask your doctor or lactation specialist whether you need to change your pumping  routine. Medicines Take over-the-counter and prescription medicines only as told by your doctor. If you were prescribed an antibiotic medicine, take it as told by your doctor. Do not stop taking it even if you start to feel better. Contact a doctor if: You have pus-like fluid leaking from your breast. You have a fever. Your symptoms do not get better within 2 days of starting treatment. Get help right away if: Your pain and swelling are getting worse. Your pain is not helped by medicine. You have a red line going from your breast toward your armpit. Summary Mastitis is irritation and swelling in an area of the breast. Mastitis will sometimes go away on its own. Get plenty of rest. Contact a doctor if your symptoms do not get better within 2 days. If you were prescribed an antibiotic medicine, do not stop taking it even if you start to feel better. This information is not intended to replace advice given to you by your health care provider. Make sure you discuss any questions you have with your health care provider. Document Revised: 11/10/2021 Document Reviewed: 08/08/2020 Elsevier Patient Education  [place].    If you have been instructed to have an in-person evaluation today at a local Urgent Care facility, please  use the link below. It will take you to a list of all of our available [facility], including address, phone number and hours of operation. Please do not delay care.  [facility]  If you or a family member do not have a primary care provider, use the link below to schedule a visit and establish care. When you choose a [facility] primary care physician or advanced practice provider, you gain a long-term partner in health. Find a Primary Care Provider  Learn more about [facility]'s in-office and virtual care options: [facility]

## 2022-03-23 NOTE — Progress Notes (Signed)
Virtual Visit Consent   Kelly Barry, you are scheduled for a virtual visit with a [facility] provider today. Just as with appointments in the office, your consent must be obtained to participate. Your consent will be active for this visit and any virtual visit you may have with one of our providers in the next 365 days. If you have a MyChart account, a copy of this consent can be sent to you electronically.  As this is a virtual visit, video technology does not allow for your provider to perform a traditional examination. This may limit your provider's ability to fully assess your condition. If your provider identifies any concerns that need to be evaluated in person or the need to arrange testing (such as labs, EKG, etc.), we will make arrangements to do so. Although advances in technology are sophisticated, we cannot ensure that it will always work on either your end or our end. If the connection with a video visit is poor, the visit may have to be switched to a telephone visit. With either a video or telephone visit, we are not always able to ensure that we have a secure connection.  By engaging in this virtual visit, you consent to the provision of healthcare and authorize for your insurance to be billed (if applicable) for the services provided during this visit. Depending on your insurance coverage, you may receive a charge related to this service.  I need to obtain your verbal consent now. Are you willing to proceed with your visit today? Kelly Barry has provided verbal consent on 03/23/2022 for a virtual visit (video or telephone). Mar Daring, PA-C  Date: 03/23/2022 2:37 PM  Virtual Visit via Video Note   I, Mar Daring, connected with  Kelly Barry  ([id number], October 06, 1991) on 03/23/22 at  2:15 PM EDT by a video-enabled telemedicine application and verified that I am speaking with the correct person using two identifiers.  Location: Patient: Virtual Visit  Location Patient: Home Provider: Virtual Visit Location Provider: Home Office   I discussed the limitations of evaluation and management by telemedicine and the availability of in person appointments. The patient expressed understanding and agreed to proceed.    History of Present Illness: Kelly Barry is a 31 y.o. who identifies as a female who was assigned female at birth, and is being seen today for sore throat.  HPI: Sore Throat  This is a new problem. The current episode started in the past 7 days (2 days ago, did get a tongue piercing and nipples pierced (having purulent drainage from nipples)). The problem has been gradually worsening. Maximum temperature: subjective fevers, worse at night. The fever has been present for 1 to 2 days. The pain is moderate. Associated symptoms include congestion, headaches, a hoarse voice, swollen glands and vomiting. Pertinent negatives include no coughing, ear discharge, ear pain, shortness of breath or trouble swallowing. Associated symptoms comments: Rhinorrhea, nausea, decreased appetite, myalgias, lightheaded/dizzy, chest tightness. She has had no exposure to strep or mono. Bruising easily.  BP 148/111 HR87  Problems:  Patient Active Problem List   Diagnosis Date Noted   Postoperative abdominal hernia 11/02/2020   Postoperative pain 11/02/2020   Postoperative visit 11/02/2020   Encounter for female sterilization procedure    Encounter for preoperative assessment 10/13/2020   Encounter for consultation for female sterilization 08/10/2020   Mixed hyperlipidemia    Prediabetes 05/08/2019   COVID-19 04/29/2019   Asthma 04/29/2019   Transaminitis 04/29/2019  GAD (generalized anxiety disorder) 03/10/2019   Depression, major, single episode, moderate ([place]) 03/10/2019   History of miscarriage 02/21/2018   Mood disorder ([place]) 07/04/2017   PTSD (post-traumatic stress disorder) 01/14/2017   Suicidal ideations 03/28/2016   MDD (major depressive  disorder), recurrent episode, severe ([place]) 03/27/2016    Allergies:  Allergies  Allergen Reactions   Diclofenac Anaphylaxis, Swelling and Rash   Hydroxyzine Other (See Comments)    Headaches....fevers   Lactose Intolerance (Gi) Diarrhea and Nausea And Vomiting   Icy Hot Rash   Medications:  Current Outpatient Medications:    amoxicillin-clavulanate (AUGMENTIN) 875-125 MG tablet, Take 1 tablet by mouth 2 (two) times daily., Disp: 14 tablet, Rfl: 0   fluconazole (DIFLUCAN) 150 MG tablet, Take 1 tablet (150 mg total) by mouth every 3 (three) days as needed., Disp: 2 tablet, Rfl: 0   ondansetron (ZOFRAN) 4 MG tablet, Take 1 tablet (4 mg total) by mouth every 8 (eight) hours as needed for nausea or vomiting., Disp: 20 tablet, Rfl: 0   albuterol (VENTOLIN HFA) 108 (90 Base) MCG/ACT inhaler, Inhale 2 puffs into the lungs every 6 (six) hours as needed for wheezing or shortness of breath., Disp: 18 g, Rfl: 2   clonazePAM (KLONOPIN) 0.5 MG tablet, Take 1 tablet (0.5 mg total) by mouth 3 (three) times daily as needed., Disp: 90 tablet, Rfl: 2   Melatonin 1 MG CAPS, Take by mouth., Disp: , Rfl:    prazosin (MINIPRESS) 2 MG capsule, Take 2 capsules (4 mg total) by mouth at bedtime. (Patient taking differently: Take 4 mg by mouth at bedtime.), Disp: 180 capsule, Rfl: 1   sertraline (ZOLOFT) 50 MG tablet, Take 50 mg by mouth daily., Disp: , Rfl:    topiramate (TOPAMAX) 50 MG tablet, Take 1 tablet (50 mg total) by mouth 2 (two) times daily. Needs to be seen before next refill, Disp: 60 tablet, Rfl: 0  Observations/Objective: Patient is well-developed, well-nourished in no acute distress.  Resting comfortably at home.  Head is normocephalic, atraumatic.  No labored breathing.  Speech is clear and coherent with logical content.  Patient is alert and oriented at baseline.    Assessment and Plan: 1. Nipple infection - amoxicillin-clavulanate (AUGMENTIN) 875-125 MG tablet; Take 1 tablet by mouth 2 (two)  times daily.  Dispense: 14 tablet; Refill: 0  2. Antibiotic-induced yeast infection - fluconazole (DIFLUCAN) 150 MG tablet; Take 1 tablet (150 mg total) by mouth every 3 (three) days as needed.  Dispense: 2 tablet; Refill: 0  3. Sore throat  4. Bruises easily  - Suspect multiple issues - Will prescribe Augmentin for possible infected piercing of nipples - Can apply warm compresses - Continue saline washes and keep area clean and dry - Sore throat, body aches, rhinorrhea may be more consistent with Viral URI like Covid - Advised to consider Covid testing - If possible strep, augmentin will cover - Continue tylenol for body aches and fevers - Diflucan given as prophylaxis as patient tends to get vaginal yeast infections with antibiotic use. - Follow up in person for blood work for bruising - Seek in person evaluation if symptoms worsen or fail to improve  Follow Up Instructions: I discussed the assessment and treatment plan with the patient. The patient was provided an opportunity to ask questions and all were answered. The patient agreed with the plan and demonstrated an understanding of the instructions.  A copy of instructions were sent to the patient via MyChart unless otherwise noted below.  The patient was advised to call back or seek an in-person evaluation if the symptoms worsen or if the condition fails to improve as anticipated.  Time:  I spent 22 minutes with the patient via telehealth technology discussing the above problems/concerns.    Mar Daring, PA-C

## 2022-05-31 ENCOUNTER — Other Ambulatory Visit: Payer: Self-pay

## 2022-05-31 ENCOUNTER — Ambulatory Visit (HOSPITAL_COMMUNITY)
Admission: AD | Admit: 2022-05-31 | Discharge: 2022-05-31 | Disposition: A | Discharge status: Home / Self Care | Payer: No Typology Code available for payment source | Attending: Psychiatry | Admitting: Psychiatry

## 2022-05-31 ENCOUNTER — Encounter (HOSPITAL_COMMUNITY): Payer: Self-pay

## 2022-05-31 ENCOUNTER — Emergency Department (HOSPITAL_COMMUNITY)
Admission: EM | Admit: 2022-05-31 | Discharge: 2022-06-01 | Disposition: A | Discharge status: Other Acute Inpatient Hospital | Payer: No Typology Code available for payment source | Attending: Emergency Medicine | Admitting: Emergency Medicine

## 2022-05-31 DIAGNOSIS — Y900 Blood alcohol level of less than 20 mg/100 ml: Secondary | ICD-10-CM | POA: Insufficient documentation

## 2022-05-31 DIAGNOSIS — R45851 Suicidal ideations: Secondary | ICD-10-CM | POA: Insufficient documentation

## 2022-05-31 DIAGNOSIS — Z20822 Contact with and (suspected) exposure to covid-19: Secondary | ICD-10-CM | POA: Insufficient documentation

## 2022-05-31 DIAGNOSIS — F4310 Post-traumatic stress disorder, unspecified: Secondary | ICD-10-CM

## 2022-05-31 DIAGNOSIS — F29 Unspecified psychosis not due to a substance or known physiological condition: Secondary | ICD-10-CM | POA: Diagnosis not present

## 2022-05-31 DIAGNOSIS — F39 Unspecified mood [affective] disorder: Secondary | ICD-10-CM

## 2022-05-31 DIAGNOSIS — F321 Major depressive disorder, single episode, moderate: Secondary | ICD-10-CM

## 2022-05-31 DIAGNOSIS — M25512 Pain in left shoulder: Secondary | ICD-10-CM | POA: Insufficient documentation

## 2022-05-31 DIAGNOSIS — Z043 Encounter for examination and observation following other accident: Secondary | ICD-10-CM | POA: Diagnosis present

## 2022-05-31 LAB — I-STAT BETA HCG BLOOD, ED (MC, WL, AP ONLY): I-stat hCG, quantitative: <5 m[IU]/mL (ref <5)

## 2022-05-31 LAB — COMPREHENSIVE METABOLIC PANEL
ALT: 26 U/L (ref 0–44)
AST: 31 U/L (ref 15–41)
Albumin: 4.4 g/dL (ref 3.5–5.0)
Alkaline Phosphatase: 59 U/L (ref 38–126)
Anion gap: 10 (ref 5–15)
BUN: 10 mg/dL (ref 6–20)
CO2: 18 mmol/L — ABNORMAL LOW (ref 22–32)
Calcium: 9.9 mg/dL (ref 8.9–10.3)
Chloride: 110 mmol/L (ref 98–111)
Creatinine, Ser: 0.58 mg/dL (ref 0.44–1.00)
GFR, Estimated: >60 mL/min (ref >60)
Glucose, Bld: 116 mg/dL — ABNORMAL HIGH (ref 70–99)
Potassium: 3.5 mmol/L (ref 3.5–5.1)
Sodium: 138 mmol/L (ref 135–145)
Total Bilirubin: 1.2 mg/dL (ref 0.3–1.2)
Total Protein: 8.4 g/dL — ABNORMAL HIGH (ref 6.5–8.1)

## 2022-05-31 LAB — RAPID URINE DRUG SCREEN, HOSP PERFORMED
Amphetamines: POSITIVE — AB
Barbiturates: NOT DETECTED
Benzodiazepines: NOT DETECTED
Cocaine: NOT DETECTED
Opiates: NOT DETECTED
Tetrahydrocannabinol: POSITIVE — AB

## 2022-05-31 LAB — CBC
HCT: 52.9 % — ABNORMAL HIGH (ref 36.0–46.0)
Hemoglobin: 17.7 g/dL — ABNORMAL HIGH (ref 12.0–15.0)
MCH: 29.6 pg (ref 26.0–34.0)
MCHC: 33.5 g/dL (ref 30.0–36.0)
MCV: 88.6 fL (ref 80.0–100.0)
Platelets: 265 10*3/uL (ref 150–400)
RBC: 5.97 MIL/uL — ABNORMAL HIGH (ref 3.87–5.11)
RDW: 13.4 % (ref 11.5–15.5)
WBC: 9.5 10*3/uL (ref 4.0–10.5)
nRBC: 0 % (ref 0.0–0.2)

## 2022-05-31 LAB — SALICYLATE LEVEL: Salicylate Lvl: <7 mg/dL — ABNORMAL LOW (ref 7.0–30.0)

## 2022-05-31 LAB — SARS CORONAVIRUS 2 BY RT PCR: SARS Coronavirus 2 by RT PCR: NEGATIVE

## 2022-05-31 LAB — RAPID HIV SCREEN (HIV 1/2 AB+AG)
HIV 1/2 Antibodies: NONREACTIVE
HIV-1 P24 Antigen - HIV24: NONREACTIVE

## 2022-05-31 LAB — ETHANOL: Alcohol, Ethyl (B): <10 mg/dL (ref <10)

## 2022-05-31 LAB — ACETAMINOPHEN LEVEL: Acetaminophen (Tylenol), Serum: <10 ug/mL — ABNORMAL LOW (ref 10–30)

## 2022-05-31 MED ORDER — CLONAZEPAM 0.5 MG PO TABS
0.5000 mg | ORAL_TABLET | Freq: Three times a day (TID) | ORAL | Status: DC | PRN
  Administered 2022-05-31: 0.5 mg via ORAL
  Filled 2022-05-31: qty 1

## 2022-05-31 MED ORDER — DIPHENHYDRAMINE HCL 50 MG/ML IJ SOLN: 25.0000 mg | Freq: Once | INTRAMUSCULAR | Status: DC

## 2022-05-31 MED ORDER — SERTRALINE HCL 50 MG PO TABS
50.0000 mg | ORAL_TABLET | Freq: Every day | ORAL | Status: DC
Start: 2022-05-31 — End: 2022-06-01
  Administered 2022-05-31 – 2022-06-01 (×2): 50 mg via ORAL
  Filled 2022-05-31 (×2): qty 1

## 2022-05-31 MED ORDER — HALOPERIDOL LACTATE 5 MG/ML IJ SOLN: 5.0000 mg | Freq: Once | INTRAMUSCULAR | Status: DC

## 2022-05-31 NOTE — ED Triage Notes (Signed)
Pt is very hostile and refusing lab work, vital signs and is refusing to change in burgundy scrubs
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
## 2022-05-31 NOTE — Progress Notes (Signed)
Pt came from [facility], not [facility]

## 2022-05-31 NOTE — ED Notes (Signed)
Pt has been dressed out into burgundy scrubs. Pt belongings have been placed in the 5-8 cabinet at the nurses station in 5-8.  She had a shirt, pants, shoes, and three hair bows.Pt has been wanded by security.

## 2022-05-31 NOTE — ED Notes (Signed)
The patient has been compliant now but states she does not want any men involved in her care today. She is very cooperative when it is only women near her. She stated she does not trust men and becomes suicidal when men are near her. Staff members today informed of the the plan of care to only have women involved in her care today.  A female Animal nutritionist wanded patient after she changed into burgundy scrubs.   Pt is currently calm and cooperative at this time.
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
## 2022-06-01 LAB — HEPATITIS PANEL, ACUTE
HCV Ab: NONREACTIVE
Hep A IgM: NONREACTIVE
Hep B C IgM: NONREACTIVE
Hepatitis B Surface Ag: NONREACTIVE

## 2022-06-01 MED ORDER — ACETAMINOPHEN 500 MG PO TABS
1000.0000 mg | ORAL_TABLET | Freq: Four times a day (QID) | ORAL | Status: DC | PRN
  Administered 2022-06-01: 1000 mg via ORAL
  Filled 2022-06-01: qty 2

## 2022-06-01 NOTE — Progress Notes (Addendum)
Pt was accepted to [facility] 06/01/22; Bed Assignment [facility]  Pt meets inpatient criteria per  Laretta Bolster, FNP  Attending Physician will be Donzetta Sprung, NP  Report can be called to: [phone]  Please fax IVC paperwork to [phone]  Pt can arrive after: 9:00am  Nursing notified: Lavonda Jumbo, RN   Nadara Mode, [place] 06/01/2022 @ 1:07 AM
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
## 2022-06-01 NOTE — Progress Notes (Signed)
Inpatient Behavioral Health Placement  Pt meets inpatient criteria per Laretta Bolster, FNP. There are no available beds at [facility] per [facility] Wynonia Hazard, RN.  Referral was sent to the following facilities;   Destination Service Provider Address Phone Fax  [facility]  [address] [place] [address] [phone] [place]  [place], [place] [address] [phone]  [facility]  [address] [place] [address] [place]  [facility]  [address] [place] [address] [phone]  [facility]  [address], [place] [address] [phone]  [facility]  [address] [place] [address] [phone] [place]  [address], [place] [address] [phone] [facility]  [address] [place] [address] [phone]  [facility]  [address] [place] [address] [phone] [facility]  [address] [place] [address] [phone] [facility]  [address] [place] [address] [phone] [place], [place] [address] [phone]    Situation ongoing,  CSW will follow up.   Benjaman Kindler, MSW, LCSWA 06/01/2022  @ 12:43 AM

## 2022-06-01 NOTE — ED Notes (Signed)
Attempted again to call [facility], no answer.  Will attempt again.

## 2022-06-01 NOTE — ED Notes (Signed)
Attempted to call report to [facility].  No answer and voicemail is full.  Will attempt again.

## 2022-06-22 DEATH — deceased
